# Patient Record
Sex: Female | Born: 1975 | Race: Black or African American | Hispanic: No | Marital: Married | Smoking: Never smoker
Health system: Southern US, Community
[De-identification: ages and names within clinical notes are randomized; demographics above are authoritative.]

## PROBLEM LIST (undated history)

## (undated) DIAGNOSIS — E119 Type 2 diabetes mellitus without complications: Secondary | ICD-10-CM

## (undated) DIAGNOSIS — M654 Radial styloid tenosynovitis [de Quervain]: Secondary | ICD-10-CM

## (undated) DIAGNOSIS — I10 Essential (primary) hypertension: Secondary | ICD-10-CM

## (undated) HISTORY — PX: HERNIA REPAIR: SHX51

## (undated) HISTORY — PX: ABDOMINAL HYSTERECTOMY: SHX81

---

## 2014-05-01 ENCOUNTER — Emergency Department (HOSPITAL_BASED_OUTPATIENT_CLINIC_OR_DEPARTMENT_OTHER)
Admission: EM | Admit: 2014-05-01 | Discharge: 2014-05-01 | Disposition: A | Discharge status: Home / Self Care | Payer: BLUE CROSS/BLUE SHIELD | Attending: Emergency Medicine | Admitting: Emergency Medicine

## 2014-05-01 ENCOUNTER — Encounter (HOSPITAL_BASED_OUTPATIENT_CLINIC_OR_DEPARTMENT_OTHER): Payer: Self-pay | Admitting: *Deleted

## 2014-05-01 DIAGNOSIS — Y93G3 Activity, cooking and baking: Secondary | ICD-10-CM | POA: Insufficient documentation

## 2014-05-01 DIAGNOSIS — T22291A Burn of second degree of multiple sites of right shoulder and upper limb, except wrist and hand, initial encounter: Secondary | ICD-10-CM | POA: Diagnosis not present

## 2014-05-01 DIAGNOSIS — T22091A Burn of unspecified degree of multiple sites of right shoulder and upper limb, except wrist and hand, initial encounter: Secondary | ICD-10-CM | POA: Diagnosis present

## 2014-05-01 DIAGNOSIS — X158XXA Contact with other hot household appliances, initial encounter: Secondary | ICD-10-CM | POA: Diagnosis not present

## 2014-05-01 DIAGNOSIS — Y92008 Other place in unspecified non-institutional (private) residence as the place of occurrence of the external cause: Secondary | ICD-10-CM | POA: Diagnosis not present

## 2014-05-01 DIAGNOSIS — T300 Burn of unspecified body region, unspecified degree: Secondary | ICD-10-CM

## 2014-05-01 DIAGNOSIS — Y998 Other external cause status: Secondary | ICD-10-CM | POA: Insufficient documentation

## 2014-05-01 MED ORDER — SILVER SULFADIAZINE 1 % EX CREA: 1.0000 "application " | TOPICAL_CREAM | Freq: Every day | CUTANEOUS | Status: DC

## 2014-05-01 MED ORDER — IBUPROFEN 400 MG PO TABS
400.0000 mg | ORAL_TABLET | Freq: Once | ORAL | Status: AC
  Administered 2014-05-01: 400 mg via ORAL
  Filled 2014-05-01: qty 1

## 2014-05-01 MED ORDER — SILVER SULFADIAZINE 1 % EX CREA
TOPICAL_CREAM | Freq: Once | CUTANEOUS | Status: AC
  Administered 2014-05-01: 22:00:00 via TOPICAL
  Filled 2014-05-01: qty 85

## 2014-05-01 NOTE — Discharge Instructions (Signed)
For pain control please take Ibuprofen (also known as Motrin or Advil) 400mg  (this is normally 2 over the counter pills) every 6 hours. Take with food to minimize stomach irritation.  If you see signs of infection (warmth, redness, tenderness, pus, sharp increase in pain, fever, red streaking) immediately return to the emergency department.  Burn Care Your skin is a natural barrier to infection. It is the largest organ of your body. Burns damage this natural protection. To help prevent infection, it is very important to follow your caregiver's instructions in the care of your burn. Burns are classified as:  First degree. There is only redness of the skin (erythema). No scarring is expected.  Second degree. There is blistering of the skin. Scarring may occur with deeper burns.  Third degree. All layers of the skin are injured, and scarring is expected. HOME CARE INSTRUCTIONS   Wash your hands well before changing your bandage.  Change your bandage as often as directed by your caregiver.  Remove the old bandage. If the bandage sticks, you may soak it off with cool, clean water.  Cleanse the burn thoroughly but gently with mild soap and water.  Pat the area dry with a clean, dry cloth.  Apply a thin layer of antibacterial cream to the burn.  Apply a clean bandage as instructed by your caregiver.  Keep the bandage as clean and dry as possible.  Elevate the affected area for the first 24 hours, then as instructed by your caregiver.  Only take over-the-counter or prescription medicines for pain, discomfort, or fever as directed by your caregiver. SEEK IMMEDIATE MEDICAL CARE IF:   You develop excessive pain.  You develop redness, tenderness, swelling, or red streaks near the burn.  The burned area develops yellowish-white fluid (pus) or a bad smell.  You have a fever. MAKE SURE YOU:   Understand these instructions.  Will watch your condition.  Will get help right away if you  are not doing well or get worse. Document Released: 02/08/2005 Document Revised: 05/03/2011 Document Reviewed: 07/01/2010 [facility] Patient Information 2015 [place]. This information is not intended to replace advice given to you by your health care provider. Make sure you discuss any questions you have with your health care provider.

## 2014-05-01 NOTE — ED Provider Notes (Signed)
CSN: [id number]     Arrival date & time 05/01/14  2048 History  This chart was scribed for Wynetta Emery, PA-C, working with Elwin Mocha, MD by Chestine Spore, ED Scribe. The patient was seen in room MHT13/MHT13 at 9:21 PM.    Chief Complaint  Patient presents with  . Burn      The history is provided by the patient. No language interpreter was used.    HPI Comments: Katherine Cabrera is a 39 y.o. female who presents to the Emergency Department complaining of burn onset 4 days ago. Pt burned the back of her arm on the oven. She was getting bread out of the oven and the oven door began to swing back onto her arm without her noticing. Pt had a friend to put burn cream on her arm as soon as it occurred. Pt came in today because it is sore and it is beginning to feel numb. She states that she is having associated symptoms of numbness. She states that she has not tried any medication for the relief of her symptoms. She denies any other symptoms.   History reviewed. No pertinent past medical history. Past Surgical History  Procedure Laterality Date  . Cesarean section     History reviewed. No pertinent family history. History  Substance Use Topics  . Smoking status: Never Smoker   . Smokeless tobacco: Not on file  . Alcohol Use: No   OB History    No data available     Review of Systems  Constitutional: Negative for fever and chills.  Skin: Positive for wound (burn to the back of arm).  Neurological: Positive for numbness.  10 systems reviewed and found to be negative, except as noted in the HPI.     Allergies  Review of patient's allergies indicates no known allergies.  Home Medications   Prior to Admission medications   Not on File   BP 121/73 mmHg  Pulse 88  Temp(Src) 98.2 F (36.8 C) (Oral)  Resp 18  SpO2 100%  LMP 04/02/2014  Physical Exam  Constitutional: She is oriented to person, place, and time. She appears well-developed and well-nourished. No distress.   HENT:  Head: Normocephalic and atraumatic.  Eyes: EOM are normal.  Neck: Neck supple. No tracheal deviation present.  Cardiovascular: Normal rate, regular rhythm and intact distal pulses.   Pulmonary/Chest: Effort normal. No respiratory distress.  Musculoskeletal: Normal range of motion.  Neurological: She is alert and oriented to person, place, and time.  Skin: Skin is warm and dry. Burn noted.  Partial thickness burn non-circumferential, 5 x 15 cm to the right lateral arm. No surrounding induration, discharge or significant warmth.  Distally neurovascularly intact.   Psychiatric: She has a normal mood and affect. Her behavior is normal.  Nursing note and vitals reviewed.   ED Course  Procedures (including critical care time) DIAGNOSTIC STUDIES: Oxygen Saturation is 100% on RA, normal by my interpretation.    COORDINATION OF CARE: 9:22 PM-Discussed treatment plan which includes Silvadene Cream Rx, Motrin, f/u if the symptoms worsen with pt at bedside and pt agreed to plan.   Labs Review Labs Reviewed - No data to display  Imaging Review No results found.   EKG Interpretation None      MDM   Final diagnoses:  Burn    Filed Vitals:   05/01/14 2056  BP: 121/73  Pulse: 88  Temp: 98.2 F (36.8 C)  TempSrc: Oral  Resp: 18  SpO2: 100%  Medications  silver sulfADIAZINE (SILVADENE) 1 % cream (not administered)  ibuprofen (ADVIL,MOTRIN) tablet 400 mg (not administered)    Katherine Cabrera is a pleasant 39 y.o. female presenting with well-healing burn to lateral right arm. This is non-circumferential, there is no signs of secondary infection. Patient will be given Silvadene cream and recommend ibuprofen for pain control. We have had an extensive discussion of return precautions for infection. Patient verbalizes her understanding.  Evaluation does not show pathology that would require ongoing emergent intervention or inpatient treatment. Pt is hemodynamically stable  and mentating appropriately. Discussed findings and plan with patient/guardian, who agrees with care plan. All questions answered. Return precautions discussed and outpatient follow up given.   New Prescriptions   SILVER SULFADIAZINE (SILVADENE) 1 % CREAM    Apply 1 application topically daily.     I personally performed the services described in this documentation, which was scribed in my presence. The recorded information has been reviewed and is accurate.   Wynetta Emeryicole Fartun Paradiso, PA-C 05/01/14 2206  Elwin MochaBlair Walden, MD 05/01/14 [phone]

## 2014-05-01 NOTE — ED Notes (Signed)
Pt reports burning the back of her arm on Sunday on the oven.  Noted to have a healing burn on her (R) arm.  No weeping, swelling noted.

## 2014-08-10 ENCOUNTER — Emergency Department (HOSPITAL_COMMUNITY): Payer: BLUE CROSS/BLUE SHIELD

## 2014-08-10 ENCOUNTER — Encounter (HOSPITAL_BASED_OUTPATIENT_CLINIC_OR_DEPARTMENT_OTHER): Payer: Self-pay | Admitting: Emergency Medicine

## 2014-08-10 ENCOUNTER — Emergency Department (HOSPITAL_BASED_OUTPATIENT_CLINIC_OR_DEPARTMENT_OTHER): Payer: BLUE CROSS/BLUE SHIELD

## 2014-08-10 ENCOUNTER — Emergency Department (HOSPITAL_BASED_OUTPATIENT_CLINIC_OR_DEPARTMENT_OTHER)
Admission: EM | Admit: 2014-08-10 | Discharge: 2014-08-10 | Disposition: A | Discharge status: Home / Self Care | Payer: BLUE CROSS/BLUE SHIELD | Attending: Emergency Medicine | Admitting: Emergency Medicine

## 2014-08-10 DIAGNOSIS — R200 Anesthesia of skin: Secondary | ICD-10-CM | POA: Diagnosis not present

## 2014-08-10 DIAGNOSIS — Z8639 Personal history of other endocrine, nutritional and metabolic disease: Secondary | ICD-10-CM

## 2014-08-10 LAB — CBC WITH DIFFERENTIAL/PLATELET
BASOS ABS: 0 10*3/uL (ref 0.0–0.1)
Basophils Relative: 1 % (ref 0–1)
EOS ABS: 0.3 10*3/uL (ref 0.0–0.7)
EOS PCT: 7 % — AB (ref 0–5)
HEMATOCRIT: 39.4 % (ref 36.0–46.0)
Hemoglobin: 13 g/dL (ref 12.0–15.0)
LYMPHS PCT: 49 % — AB (ref 12–46)
Lymphs Abs: 2.1 10*3/uL (ref 0.7–4.0)
MCH: 27.3 pg (ref 26.0–34.0)
MCHC: 33 g/dL (ref 30.0–36.0)
MCV: 82.8 fL (ref 78.0–100.0)
MONO ABS: 0.4 10*3/uL (ref 0.1–1.0)
Monocytes Relative: 9 % (ref 3–12)
Neutro Abs: 1.5 10*3/uL — ABNORMAL LOW (ref 1.7–7.7)
Neutrophils Relative %: 34 % — ABNORMAL LOW (ref 43–77)
Platelets: 200 10*3/uL (ref 150–400)
RBC: 4.76 MIL/uL (ref 3.87–5.11)
RDW: 13.4 % (ref 11.5–15.5)
WBC: 4.3 10*3/uL (ref 4.0–10.5)

## 2014-08-10 LAB — COMPREHENSIVE METABOLIC PANEL
ALT: 13 U/L — ABNORMAL LOW (ref 14–54)
ANION GAP: 9 (ref 5–15)
AST: 17 U/L (ref 15–41)
Albumin: 4 g/dL (ref 3.5–5.0)
Alkaline Phosphatase: 77 U/L (ref 38–126)
BUN: 23 mg/dL — AB (ref 6–20)
CALCIUM: 8.9 mg/dL (ref 8.9–10.3)
CO2: 24 mmol/L (ref 22–32)
CREATININE: 0.87 mg/dL (ref 0.44–1.00)
Chloride: 105 mmol/L (ref 101–111)
GFR calc Af Amer: >60 mL/min (ref >60)
GFR calc non Af Amer: >60 mL/min (ref >60)
Glucose, Bld: 148 mg/dL — ABNORMAL HIGH (ref 65–99)
Potassium: 4 mmol/L (ref 3.5–5.1)
Sodium: 138 mmol/L (ref 135–145)
Total Bilirubin: 0.3 mg/dL (ref 0.3–1.2)
Total Protein: 7.3 g/dL (ref 6.5–8.1)

## 2014-08-10 LAB — CBG MONITORING, ED: GLUCOSE-CAPILLARY: 184 mg/dL — AB (ref 65–99)

## 2014-08-10 LAB — PROTIME-INR
INR: 1 (ref 0.00–1.49)
Prothrombin Time: 13.4 seconds (ref 11.6–15.2)

## 2014-08-10 MED ORDER — ONDANSETRON HCL 4 MG/2ML IJ SOLN
INTRAMUSCULAR | Status: AC
  Filled 2014-08-10: qty 2

## 2014-08-10 MED ORDER — GADOBENATE DIMEGLUMINE 529 MG/ML IV SOLN
20.0000 mL | Freq: Once | INTRAVENOUS | Status: AC | PRN
  Administered 2014-08-10: 20 mL via INTRAVENOUS

## 2014-08-10 MED ORDER — ONDANSETRON HCL 4 MG/2ML IJ SOLN
4.0000 mg | Freq: Once | INTRAMUSCULAR | Status: AC
  Administered 2014-08-10: 4 mg via INTRAVENOUS

## 2014-08-10 NOTE — ED Provider Notes (Signed)
CSN: [id number]     Arrival date & time 08/10/14  0008 History   First MD Initiated Contact with Patient 08/10/14 0025     Chief Complaint  Patient presents with  . Numbness     (Consider location/radiation/quality/duration/timing/severity/associated sxs/prior Treatment) HPI Comments: Patient is a 39 year old female with history of type 2 diabetes, off all meds for the past year after weight loss. She presents today with complaints of numbness in the right side of her face and right arm that started while taking a shower. Her husband feels as though her speech is slurred. She denies to me she is experiencing any headache or visual changes. She denies any involvement of her leg. She denies any weakness.  Patient is a 39 y.o. female presenting with weakness. The history is provided by the patient.  Weakness This is a new problem. The current episode started less than 1 hour ago. The problem occurs constantly. The problem has not changed since onset.Pertinent negatives include no chest pain and no headaches. Nothing aggravates the symptoms. Nothing relieves the symptoms. She has tried nothing for the symptoms. The treatment provided no relief.    History reviewed. No pertinent past medical history. Past Surgical History  Procedure Laterality Date  . Cesarean section     History reviewed. No pertinent family history. History  Substance Use Topics  . Smoking status: Never Smoker   . Smokeless tobacco: Not on file  . Alcohol Use: No   OB History    No data available     Review of Systems  Cardiovascular: Negative for chest pain.  Neurological: Positive for weakness. Negative for headaches.  All other systems reviewed and are negative.     Allergies  Review of patient's allergies indicates no known allergies.  Home Medications   Prior to Admission medications   Medication Sig Start Date End Date Taking? Authorizing Provider  silver sulfADIAZINE (SILVADENE) 1 % cream Apply 1  application topically daily. 05/01/14   Nicole Pisciotta, PA-C   BP 131/66 mmHg  Pulse 79  Temp(Src) 98.6 F (37 C) (Oral)  Resp 18  Ht  (1.626 m)  Wt 210 lb (95.255 kg)  BMI 36.03 kg/m2  SpO2 100%  LMP 07/17/2014 (Approximate) Physical Exam  Constitutional: She is oriented to person, place, and time. She appears well-developed and well-nourished. No distress.  HENT:  Head: Normocephalic and atraumatic.  Eyes: EOM are normal. Pupils are equal, round, and reactive to light.  Neck: Normal range of motion. Neck supple.  Cardiovascular: Normal rate and regular rhythm.  Exam reveals no gallop and no friction rub.   No murmur heard. Pulmonary/Chest: Effort normal and breath sounds normal. No respiratory distress. She has no wheezes.  Abdominal: Soft. Bowel sounds are normal. She exhibits no distension. There is no tenderness.  Musculoskeletal: Normal range of motion.  Neurological: She is alert and oriented to person, place, and time. No cranial nerve deficit. She exhibits normal muscle tone. Coordination normal.  Skin: Skin is warm and dry. She is not diaphoretic.  Nursing note and vitals reviewed.   ED Course  Procedures (including critical care time) Labs Review Labs Reviewed  COMPREHENSIVE METABOLIC PANEL - Abnormal; Notable for the following:    Glucose, Bld 148 (*)    BUN 23 (*)    ALT 13 (*)    All other components within normal limits  CBC WITH DIFFERENTIAL/PLATELET - Abnormal; Notable for the following:    Neutrophils Relative % 34 (*)    Neutro  Abs 1.5 (*)    Lymphocytes Relative 49 (*)    Eosinophils Relative 7 (*)    All other components within normal limits  CBG MONITORING, ED - Abnormal; Notable for the following:    Glucose-Capillary 184 (*)    All other components within normal limits  PROTIME-INR    Imaging Review Ct Head Wo Contrast  08/10/2014   CLINICAL DATA:  Right-sided facial numbness moving down the right arm. Slurring speech.  EXAM: CT HEAD  WITHOUT CONTRAST  TECHNIQUE: Contiguous axial images were obtained from the base of the skull through the vertex without intravenous contrast.  COMPARISON:  None.  FINDINGS: Ventricles and sulci appear symmetrical. No mass effect or midline shift. No abnormal extra-axial fluid collections. Gray-white matter junctions are distinct. Basal cisterns are not effaced. No evidence of acute intracranial hemorrhage. No depressed skull fractures. Visualized paranasal sinuses and mastoid air cells are not opacified.  IMPRESSION: No acute intracranial abnormalities.   Electronically Signed   By: Burman Nieves M.D.   On: 08/10/2014 00:54     EKG Interpretation   Date/Time:  Saturday August 10 2014 00:24:15 EDT Ventricular Rate:  79 PR Interval:  130 QRS Duration: 86 QT Interval:  384 QTC Calculation: 440 R Axis:   101 Text Interpretation:  Normal sinus rhythm with sinus arrhythmia Rightward  axis Borderline ECG Confirmed by Vangie Henthorn  MD, Taiga Lupinacci ([id number]) on 08/10/2014  12:32:08 AM      MDM   Final diagnoses:  None    Patient is a 39 year old female who presents with numbness to her right arm and face that started while taking a shower this evening. Her exam reveals no focal deficits. Her strength is 5 out of 5 in all extremities, there is no facial droop or speech impediment, and her coordination is within normal limits. Her symptoms are mainly subjective in nature. She has undergone a CT scan which is unremarkable and laboratory studies which revealed no significant abnormalities. I've discussed this case with Dr. Hosie Poisson from neurology who is recommending the patient be transferred to [facility] for an MRI to rule out the possibility of stroke or MS. I've also spoke with Dr. Littie Deeds who agrees to accept in transfer.    Geoffery Lyons, MD 08/10/14 [phone]

## 2014-08-10 NOTE — ED Notes (Signed)
Pt comfortable with discharge and follow up instructions. Pt declines wheelchair, escorted to waiting area by this RN. No prescriptions. 

## 2014-08-10 NOTE — ED Provider Notes (Signed)
6:32 AM Patient signed out to me at change of shift by Tyler Deis, PA-C.  Pt with R sided facial and R arm numbness, slurred speech per husband.  Seen at [facility] by Dr Judd Lien.  Please see his note for further details.  Plan is for MRI brain to r/o stroke, MS.  Per PA Piepenbrink, pt still feels numbness in face but has otherwise normal neurologic exam at this time. Will discuss plan with neurology following MRI.    8:31 AM Pt reports she is feeling back to baseline now.  As she explains her symptoms to me, she had right lower face numbness and reported slurred speech around midnight that gradually resolved over a few hours.  She also had right arm and right leg aching and tingling in the bottom of her right foot.  Her MRI was negative.  Plan is to discuss with neurohospitalist for recommendations at this time.  CN II-XII intact, EOMs intact, no pronator drift, grip strengths equal bilaterally; strength 5/5 in all extremities, sensation intact in all extremities; finger to nose, heel to shin, rapid alternating movements normal; gait is normal.     8:42 AM Discussed pt, workup, and plan with Dr Amada Jupiter.  Pt may be d/c home with PCP follow up.  Doubt TIA given description of symptoms with progression down the body and pain involvement.   Results for orders placed or performed during the hospital encounter of 08/10/14  Comprehensive metabolic panel  Result Value Ref Range   Sodium 138 135 - 145 mmol/L   Potassium 4.0 3.5 - 5.1 mmol/L   Chloride 105 101 - 111 mmol/L   CO2 24 22 - 32 mmol/L   Glucose, Bld 148 (H) 65 - 99 mg/dL   BUN 23 (H) 6 - 20 mg/dL   Creatinine, Ser 2.44 0.44 - 1.00 mg/dL   Calcium 8.9 8.9 - 01.0 mg/dL   Total Protein 7.3 6.5 - 8.1 g/dL   Albumin 4.0 3.5 - 5.0 g/dL   AST 17 15 - 41 U/L   ALT 13 (L) 14 - 54 U/L   Alkaline Phosphatase 77 38 - 126 U/L   Total Bilirubin 0.3 0.3 - 1.2 mg/dL   GFR calc non Af Amer >60 >60 mL/min   GFR calc Af Amer >60 >60  mL/min   Anion gap 9 5 - 15  CBC with Differential  Result Value Ref Range   WBC 4.3 4.0 - 10.5 K/uL   RBC 4.76 3.87 - 5.11 MIL/uL   Hemoglobin 13.0 12.0 - 15.0 g/dL   HCT 27.2 53.6 - 64.4 %   MCV 82.8 78.0 - 100.0 fL   MCH 27.3 26.0 - 34.0 pg   MCHC 33.0 30.0 - 36.0 g/dL   RDW 03.4 74.2 - 59.5 %   Platelets 200 150 - 400 K/uL   Neutrophils Relative % 34 (L) 43 - 77 %   Neutro Abs 1.5 (L) 1.7 - 7.7 K/uL   Lymphocytes Relative 49 (H) 12 - 46 %   Lymphs Abs 2.1 0.7 - 4.0 K/uL   Monocytes Relative 9 3 - 12 %   Monocytes Absolute 0.4 0.1 - 1.0 K/uL   Eosinophils Relative 7 (H) 0 - 5 %   Eosinophils Absolute 0.3 0.0 - 0.7 K/uL   Basophils Relative 1 0 - 1 %   Basophils Absolute 0.0 0.0 - 0.1 K/uL  Protime-INR  Result Value Ref Range   Prothrombin Time 13.4 11.6 - 15.2 seconds  INR 1.00 0.00 - 1.49  CBG monitoring, ED  Result Value Ref Range   Glucose-Capillary 184 (H) 65 - 99 mg/dL   Ct Head Wo Contrast  08/10/2014   CLINICAL DATA:  Right-sided facial numbness moving down the right arm. Slurring speech.  EXAM: CT HEAD WITHOUT CONTRAST  TECHNIQUE: Contiguous axial images were obtained from the base of the skull through the vertex without intravenous contrast.  COMPARISON:  None.  FINDINGS: Ventricles and sulci appear symmetrical. No mass effect or midline shift. No abnormal extra-axial fluid collections. Gray-white matter junctions are distinct. Basal cisterns are not effaced. No evidence of acute intracranial hemorrhage. No depressed skull fractures. Visualized paranasal sinuses and mastoid air cells are not opacified.  IMPRESSION: No acute intracranial abnormalities.   Electronically Signed   By: Burman Nieves M.D.   On: 08/10/2014 00:54   Mr Katherine Cabrera Contrast  08/10/2014   CLINICAL DATA:  39 year old female, history of type 2 diabetes, presents with acute right-sided facial and arm numbness.  EXAM: MRI HEAD WITHOUT AND WITH CONTRAST  TECHNIQUE: Multiplanar, multiecho pulse  sequences of the brain and surrounding structures were obtained without and with intravenous contrast.  CONTRAST:  20mL MULTIHANCE GADOBENATE DIMEGLUMINE 529 MG/ML IV SOLN  COMPARISON:  Prior CT from earlier the same day.  FINDINGS: Examination mildly limited by susceptibility artifact overlying the face.  Cerebral volume within normal limits for patient age. No significant white matter changes present. No mass lesion, midline shift, or mass effect. No hydrocephalus. No extra-axial fluid collection.  No abnormal foci of restricted diffusion to suggest acute intracranial infarct. Gray-white matter differentiation is well maintained. Normal intravascular flow voids are preserved.  Craniocervical junction within normal limits. Pituitary gland normal. No acute abnormality about the orbits.  Mild mucosal thickening present within the maxillary sinuses. No air-fluid levels to suggest active sinus infection. No mastoid effusion. Inner ear structures normal.  No abnormal enhancement on post-contrast sequences.  Bone marrow signal intensity within normal limits. Scalp soft tissues are unremarkable.  IMPRESSION: Normal MRI of the brain with no acute intracranial process identified.   Electronically Signed   By: Rise Mu M.D.   On: 08/10/2014 07:29      Trixie Dredge, PA-C 08/10/14 1111  Marisa Severin, MD 08/12/14 1357

## 2014-08-10 NOTE — ED Notes (Signed)
Patient states that she was taking a shower about 1145 and started to have right sided facial numbness.Patient reports that it is progressively getting work and moving down her right arm. Husband reports that she is slurring her speech.

## 2014-08-10 NOTE — ED Notes (Signed)
I took CBG per nurse request, I got result of 184 mg./dcltr. I then placed patient on room monitor.

## 2014-08-10 NOTE — ED Provider Notes (Signed)
Patient transferred from [facility] for MR for evaluation of R sided facial and arm numbness.  Patient states numbness is improving since transfer. No new complaints.  NAD NCAT PERRLa, EOMi Uvula midline w/o deviation MAEx4 without ataxia. CN 2-12 intact Sensation intact bilaterally.  Sign out to [facility], PA-C pending MRI.   Francee Piccolo, PA-C 08/10/14 [id number]  Marisa Severin, MD 08/12/14 1356

## 2014-08-10 NOTE — Discharge Instructions (Signed)
Read the information below.  You may return to the Emergency Department at any time for worsening condition or any new symptoms that concern you.  Please follow up with your primary care provider next week for a recheck.   SEEK MEDICAL ATTENTION IF: You develop possible problems with medications prescribed.  The medications don't resolve your headache, if it recurs , or if you have multiple episodes of vomiting or can't take fluids. You have a change from the usual headache. RETURN IMMEDIATELY IF you develop a sudden, severe headache or confusion, become poorly responsive or faint, develop a fever above 100.19F or problem breathing, have a change in speech, vision, swallowing, or understanding, or develop new weakness, numbness, tingling, incoordination, or have a seizure.    Paresthesia Paresthesia is a burning or prickling feeling. This feeling can happen in any part of the body. It often happens in the hands, arms, legs, or feet. HOME CARE  Avoid drinking alcohol.  Try massage or needle therapy (acupuncture) to help with your problems.  Keep all doctor visits as told. GET HELP RIGHT AWAY IF:   You feel weak.  You have trouble walking or moving.  You have problems speaking or seeing.  You feel confused.  You cannot control when you poop (bowel movement) or pee (urinate).  You lose feeling (numbness) after an injury.  You pass out (faint).  Your burning or prickling feeling gets worse when you walk.  You have pain, cramps, or feel dizzy.  You have a rash. MAKE SURE YOU:   Understand these instructions.  Will watch your condition.  Will get help right away if you are not doing well or get worse. Document Released: 01/22/2008 Document Revised: 05/03/2011 Document Reviewed: 10/30/2010 [facility] Patient Information 2015 [place], [place]. This information is not intended to replace advice given to you by your health care provider. Make sure you discuss any questions you have  with your health care provider.

## 2014-08-10 NOTE — ED Notes (Signed)
Called into the room because she is feeling nauseated. MD aware and orders carried out

## 2014-08-10 NOTE — ED Notes (Signed)
Reports pain in rt shoulder with numbness to rt arm

## 2014-09-26 ENCOUNTER — Emergency Department (HOSPITAL_BASED_OUTPATIENT_CLINIC_OR_DEPARTMENT_OTHER): Payer: BLUE CROSS/BLUE SHIELD

## 2014-09-26 ENCOUNTER — Encounter (HOSPITAL_BASED_OUTPATIENT_CLINIC_OR_DEPARTMENT_OTHER): Payer: Self-pay | Admitting: *Deleted

## 2014-09-26 ENCOUNTER — Emergency Department (HOSPITAL_BASED_OUTPATIENT_CLINIC_OR_DEPARTMENT_OTHER)
Admission: EM | Admit: 2014-09-26 | Discharge: 2014-09-26 | Disposition: A | Discharge status: Home / Self Care | Payer: BLUE CROSS/BLUE SHIELD | Attending: Emergency Medicine | Admitting: Emergency Medicine

## 2014-09-26 DIAGNOSIS — R1011 Right upper quadrant pain: Secondary | ICD-10-CM

## 2014-09-26 DIAGNOSIS — Z3202 Encounter for pregnancy test, result negative: Secondary | ICD-10-CM | POA: Insufficient documentation

## 2014-09-26 DIAGNOSIS — K2970 Gastritis, unspecified, without bleeding: Secondary | ICD-10-CM | POA: Diagnosis not present

## 2014-09-26 LAB — COMPREHENSIVE METABOLIC PANEL
ALBUMIN: 3.6 g/dL (ref 3.5–5.0)
ALK PHOS: 60 U/L (ref 38–126)
ALT: 12 U/L — ABNORMAL LOW (ref 14–54)
AST: 19 U/L (ref 15–41)
Anion gap: 8 (ref 5–15)
BUN: 21 mg/dL — ABNORMAL HIGH (ref 6–20)
CHLORIDE: 105 mmol/L (ref 101–111)
CO2: 24 mmol/L (ref 22–32)
CREATININE: 0.77 mg/dL (ref 0.44–1.00)
Calcium: 8.5 mg/dL — ABNORMAL LOW (ref 8.9–10.3)
GFR calc Af Amer: >60 mL/min (ref >60)
GFR calc non Af Amer: >60 mL/min (ref >60)
GLUCOSE: 165 mg/dL — AB (ref 65–99)
POTASSIUM: 3.6 mmol/L (ref 3.5–5.1)
Sodium: 137 mmol/L (ref 135–145)
Total Bilirubin: 0.5 mg/dL (ref 0.3–1.2)
Total Protein: 7.1 g/dL (ref 6.5–8.1)

## 2014-09-26 LAB — CBC WITH DIFFERENTIAL/PLATELET
BASOS ABS: 0 10*3/uL (ref 0.0–0.1)
BASOS PCT: 0 % (ref 0–1)
EOS ABS: 0.1 10*3/uL (ref 0.0–0.7)
Eosinophils Relative: 1 % (ref 0–5)
HEMATOCRIT: 34.6 % — AB (ref 36.0–46.0)
Hemoglobin: 11.5 g/dL — ABNORMAL LOW (ref 12.0–15.0)
Lymphocytes Relative: 8 % — ABNORMAL LOW (ref 12–46)
Lymphs Abs: 0.7 10*3/uL (ref 0.7–4.0)
MCH: 27.3 pg (ref 26.0–34.0)
MCHC: 33.2 g/dL (ref 30.0–36.0)
MCV: 82 fL (ref 78.0–100.0)
MONO ABS: 0.6 10*3/uL (ref 0.1–1.0)
Monocytes Relative: 7 % (ref 3–12)
Neutro Abs: 7.1 10*3/uL (ref 1.7–7.7)
Neutrophils Relative %: 84 % — ABNORMAL HIGH (ref 43–77)
PLATELETS: 165 10*3/uL (ref 150–400)
RBC: 4.22 MIL/uL (ref 3.87–5.11)
RDW: 12.7 % (ref 11.5–15.5)
WBC: 8.5 10*3/uL (ref 4.0–10.5)

## 2014-09-26 LAB — URINE MICROSCOPIC-ADD ON

## 2014-09-26 LAB — URINALYSIS, ROUTINE W REFLEX MICROSCOPIC
BILIRUBIN URINE: NEGATIVE
GLUCOSE, UA: NEGATIVE mg/dL
Ketones, ur: NEGATIVE mg/dL
Leukocytes, UA: NEGATIVE
NITRITE: NEGATIVE
PROTEIN: NEGATIVE mg/dL
SPECIFIC GRAVITY, URINE: 1.027 (ref 1.005–1.030)
Urobilinogen, UA: 0.2 mg/dL (ref 0.0–1.0)
pH: 5.5 (ref 5.0–8.0)

## 2014-09-26 LAB — LIPASE, BLOOD: Lipase: 17 U/L — ABNORMAL LOW (ref 22–51)

## 2014-09-26 LAB — PREGNANCY, URINE: Preg Test, Ur: NEGATIVE

## 2014-09-26 MED ORDER — GI COCKTAIL ~~LOC~~
30.0000 mL | Freq: Once | ORAL | Status: AC
  Administered 2014-09-26: 30 mL via ORAL
  Filled 2014-09-26: qty 30

## 2014-09-26 MED ORDER — LANSOPRAZOLE 15 MG PO TBDP: 15.0000 mg | ORAL_TABLET | Freq: Two times a day (BID) | ORAL | Status: DC

## 2014-09-26 NOTE — ED Provider Notes (Signed)
CSN: [id number]     Arrival date & time 09/26/14  0811 History   First MD Initiated Contact with Patient 09/26/14 0827     No chief complaint on file.    (Consider location/radiation/quality/duration/timing/severity/associated sxs/prior Treatment) HPI Comments: Patient presents with abdominal pain. She states earlier this morning she woke up with a crampy pain in her upper abdomen which has gotten worse throughout the morning. She now describes a sharp pain across her upper abdomen or in the right upper abdomen. She has some nausea but no vomiting. She's having normal bowel movements. She denies any urinary symptoms. She denies any vaginal bleeding or discharge. She denies any past abdominal surgeries other than a C-section. She states she ate chicken, green beans and biscuit for dinner last night.   History reviewed. No pertinent past medical history. Past Surgical History  Procedure Laterality Date  . Cesarean section     No family history on file. History  Substance Use Topics  . Smoking status: Never Smoker   . Smokeless tobacco: Not on file  . Alcohol Use: No   OB History    No data available     Review of Systems  Constitutional: Negative for fever, chills, diaphoresis and fatigue.  HENT: Negative for congestion, rhinorrhea and sneezing.   Eyes: Negative.   Respiratory: Negative for cough, chest tightness and shortness of breath.   Cardiovascular: Negative for chest pain and leg swelling.  Gastrointestinal: Positive for nausea and abdominal pain. Negative for vomiting, diarrhea and blood in stool.  Genitourinary: Negative for frequency, hematuria, flank pain and difficulty urinating.  Musculoskeletal: Negative for back pain and arthralgias.  Skin: Negative for rash.  Neurological: Negative for dizziness, speech difficulty, weakness, numbness and headaches.      Allergies  Review of patient's allergies indicates no known allergies.  Home Medications   Prior to  Admission medications   Medication Sig Start Date End Date Taking? Authorizing Provider  lansoprazole (PREVACID SOLUTAB) 15 MG disintegrating tablet Take 1 tablet (15 mg total) by mouth 2 (two) times daily before a meal. 09/26/14   Rolan Bucco, MD   BP 124/69 mmHg  Pulse 80  Temp(Src) 98.4 F (36.9 C) (Oral)  Resp 18  Ht  (1.6 m)  Wt 206 lb (93.441 kg)  BMI 36.50 kg/m2  SpO2 100%  LMP 09/17/2014 Physical Exam  Constitutional: She is oriented to person, place, and time. She appears well-developed and well-nourished.  HENT:  Head: Normocephalic and atraumatic.  Eyes: Pupils are equal, round, and reactive to light.  Neck: Normal range of motion. Neck supple.  Cardiovascular: Normal rate, regular rhythm and normal heart sounds.   Pulmonary/Chest: Effort normal and breath sounds normal. No respiratory distress. She has no wheezes. She has no rales. She exhibits no tenderness.  Abdominal: Soft. Bowel sounds are normal. There is tenderness (moderate TTP to epigastrium and RUQ). There is no rebound and no guarding.  Musculoskeletal: Normal range of motion. She exhibits no edema.  Lymphadenopathy:    She has no cervical adenopathy.  Neurological: She is alert and oriented to person, place, and time.  Skin: Skin is warm and dry. No rash noted.  Psychiatric: She has a normal mood and affect.    ED Course  Procedures (including critical care time) Labs Review Labs Reviewed  URINALYSIS, ROUTINE W REFLEX MICROSCOPIC (NOT AT [facility]) - Abnormal; Notable for the following:    APPearance CLOUDY (*)    Hgb urine dipstick MODERATE (*)    All  other components within normal limits  COMPREHENSIVE METABOLIC PANEL - Abnormal; Notable for the following:    Glucose, Bld 165 (*)    BUN 21 (*)    Calcium 8.5 (*)    ALT 12 (*)    All other components within normal limits  LIPASE, BLOOD - Abnormal; Notable for the following:    Lipase 17 (*)    All other components within normal limits  CBC WITH  DIFFERENTIAL/PLATELET - Abnormal; Notable for the following:    Hemoglobin 11.5 (*)    HCT 34.6 (*)    Neutrophils Relative % 84 (*)    Lymphocytes Relative 8 (*)    All other components within normal limits  URINE MICROSCOPIC-ADD ON - Abnormal; Notable for the following:    Squamous Epithelial / LPF FEW (*)    Bacteria, UA MANY (*)    All other components within normal limits  PREGNANCY, URINE    Imaging Review US Abdomen Limited Ruq  09/26/2014   CLINICAL DATA:  Right upper quadrant pain with nausea  EXAM: US ABDOMEN LIMITED - RIGHT UPPER QUADRANT  COMPARISON:  None.  FINDINGS: Gallbladder:  No gallstones or wall thickening visualized. There is no pericholecystic fluid. No sonographic Murphy sign noted.  Common bile duct:  Diameter: 2 mm. There is no intrahepatic or extrahepatic biliary duct dilatation.  Liver:  No focal lesion identified. Within normal limits in parenchymal echogenicity.  IMPRESSION: Study within normal limits.   Electronically Signed   By: Bretta Bang III M.D.   On: 09/26/2014 09:27     EKG Interpretation None      MDM   Final diagnoses:  RUQ pain  Gastritis    Patient presents with upper abdominal pain. Her ultrasound is negative for gallbladder disease. There is no evidence of pancreatitis. Her other labs are unremarkable. She's feeling better after GI cocktail. Her urine had a little bit of blood in it but she does say that she's having some vaginal spotting now. She doesn't have symptoms consistent with a UTI. Her glucose is mildly elevated. She does say that it's been elevated in the past. She does say that she has a primary care provider and I did encourage her to make a follow-up appointment with her primary care provider to discuss her elevated glucose levels and to have a recheck on her abdomen. I feel that her symptoms are more consistent with gastritis however I did tell her that if her pain continues she might need to have an endoscopy or further  gallbladder studies. I also advised her to use a bland diet. Return precautions were given.    Rolan Bucco, MD 09/26/14 1016

## 2014-09-26 NOTE — Discharge Instructions (Signed)

## 2014-09-26 NOTE — ED Notes (Signed)
C/o pain around umbilicus and radiated to right upper abd pain. No n/v/d. C/o chills. No vag discharge. Onset 0400 today.

## 2015-03-20 ENCOUNTER — Encounter (HOSPITAL_BASED_OUTPATIENT_CLINIC_OR_DEPARTMENT_OTHER): Payer: Self-pay | Admitting: *Deleted

## 2015-03-20 ENCOUNTER — Emergency Department (HOSPITAL_BASED_OUTPATIENT_CLINIC_OR_DEPARTMENT_OTHER)
Admission: EM | Admit: 2015-03-20 | Discharge: 2015-03-20 | Disposition: A | Discharge status: Home / Self Care | Payer: BLUE CROSS/BLUE SHIELD | Attending: Emergency Medicine | Admitting: Emergency Medicine

## 2015-03-20 DIAGNOSIS — Z8739 Personal history of other diseases of the musculoskeletal system and connective tissue: Secondary | ICD-10-CM | POA: Diagnosis not present

## 2015-03-20 DIAGNOSIS — Y9389 Activity, other specified: Secondary | ICD-10-CM | POA: Diagnosis not present

## 2015-03-20 DIAGNOSIS — Z79899 Other long term (current) drug therapy: Secondary | ICD-10-CM | POA: Insufficient documentation

## 2015-03-20 DIAGNOSIS — Y9289 Other specified places as the place of occurrence of the external cause: Secondary | ICD-10-CM | POA: Diagnosis not present

## 2015-03-20 DIAGNOSIS — Z3202 Encounter for pregnancy test, result negative: Secondary | ICD-10-CM | POA: Insufficient documentation

## 2015-03-20 DIAGNOSIS — X58XXXA Exposure to other specified factors, initial encounter: Secondary | ICD-10-CM | POA: Diagnosis not present

## 2015-03-20 DIAGNOSIS — S39011A Strain of muscle, fascia and tendon of abdomen, initial encounter: Secondary | ICD-10-CM | POA: Insufficient documentation

## 2015-03-20 DIAGNOSIS — Y998 Other external cause status: Secondary | ICD-10-CM | POA: Insufficient documentation

## 2015-03-20 DIAGNOSIS — S3992XA Unspecified injury of lower back, initial encounter: Secondary | ICD-10-CM | POA: Insufficient documentation

## 2015-03-20 DIAGNOSIS — S3991XA Unspecified injury of abdomen, initial encounter: Secondary | ICD-10-CM | POA: Diagnosis present

## 2015-03-20 DIAGNOSIS — T148XXA Other injury of unspecified body region, initial encounter: Secondary | ICD-10-CM

## 2015-03-20 HISTORY — DX: Radial styloid tenosynovitis (de quervain): M65.4

## 2015-03-20 LAB — URINE MICROSCOPIC-ADD ON

## 2015-03-20 LAB — URINALYSIS, ROUTINE W REFLEX MICROSCOPIC
BILIRUBIN URINE: NEGATIVE
Glucose, UA: NEGATIVE mg/dL
HGB URINE DIPSTICK: NEGATIVE
KETONES UR: NEGATIVE mg/dL
Nitrite: NEGATIVE
PROTEIN: NEGATIVE mg/dL
Specific Gravity, Urine: 1.024 (ref 1.005–1.030)
pH: 6.5 (ref 5.0–8.0)

## 2015-03-20 LAB — PREGNANCY, URINE: Preg Test, Ur: NEGATIVE

## 2015-03-20 MED ORDER — DIAZEPAM 5 MG PO TABS
5.0000 mg | ORAL_TABLET | Freq: Once | ORAL | Status: AC
  Administered 2015-03-20: 5 mg via ORAL
  Filled 2015-03-20: qty 1

## 2015-03-20 MED ORDER — CYCLOBENZAPRINE HCL 10 MG PO TABS: 10.0000 mg | ORAL_TABLET | Freq: Two times a day (BID) | ORAL | Status: DC | PRN

## 2015-03-20 NOTE — ED Provider Notes (Signed)
CSN: [id number]     Arrival date & time 03/20/15  2151 History  By signing my name below, I, Tanda Rockers, attest that this documentation has been prepared under the direction and in the presence of Alvira Monday, MD. Electronically Signed: Tanda Rockers, ED Scribe. 03/20/2015. 11:02 PM.     Chief Complaint  Patient presents with  . Abdominal Pain   Patient is a 40 y.o. female presenting with back pain. The history is provided by the patient. No language interpreter was used.  Back Pain Location:  Thoracic spine (Right sided) Quality:  Cramping (Sharp) Pain severity:  Moderate Onset quality:  Sudden Duration:  12 hours Timing:  Constant Progression:  Worsening Chronicity:  New Context comment:  Reaching in cabinet Worsened by:  Movement Ineffective treatments:  Heating pad Associated symptoms: no abdominal pain, no chest pain, no dysuria, no fever and no headaches      HPI Comments: Katherine Cabrera is a 40 y.o. female who presents to the Emergency Department complaining of sudden onset, constant, gradually worsening, moderate, sharp and cramping, right side pain radiating into right back that began this morning. Pt states that she was reaching to grab something out of a cabinet when she began having immediate pain. The pain is exacerbated with movement. There is no exacerbation or change in pain after eating or drinking. Pt applied heat without relief. No meds for the pain. Denies fever, nausea, vomiting, diarrhea, constipation, fever, vaginal bleeding, vaginal discharge, dysuria, hematuria, cough, shortness of breath, or any other associated symptoms. PSHx cesarean section. No hx kidney stones or gall stones.    Past Medical History  Diagnosis Date  . De Quervain's disease (radial styloid tenosynovitis)    Past Surgical History  Procedure Laterality Date  . Cesarean section     No family history on file. Social History  Substance Use Topics  . Smoking status: Never Smoker    . Smokeless tobacco: None  . Alcohol Use: No   OB History    No data available     Review of Systems  Constitutional: Negative for fever.  HENT: Negative for sore throat.   Eyes: Negative for visual disturbance.  Respiratory: Negative for cough and shortness of breath.   Cardiovascular: Negative for chest pain.  Gastrointestinal: Negative for nausea, vomiting, abdominal pain, diarrhea and constipation.  Genitourinary: Negative for dysuria, hematuria, vaginal bleeding, vaginal discharge and difficulty urinating.  Musculoskeletal: Positive for back pain and arthralgias (Right side pain). Negative for neck pain.  Skin: Negative for rash.  Neurological: Negative for syncope and headaches.   Allergies  Review of patient's allergies indicates no known allergies.  Home Medications   Prior to Admission medications   Medication Sig Start Date End Date Taking? Authorizing Provider  MELOXICAM PO Take by mouth.   Yes Historical Provider, MD  lansoprazole (PREVACID SOLUTAB) 15 MG disintegrating tablet Take 1 tablet (15 mg total) by mouth 2 (two) times daily before a meal. 09/26/14   Rolan Bucco, MD   BP 132/100 mmHg  Pulse 80  Temp(Src) 98 F (36.7 C) (Oral)  Resp 18  Ht 5' 3.5" (1.613 m)  Wt 227 lb (102.967 kg)  BMI 39.58 kg/m2  SpO2 100%  LMP 03/06/2015   Physical Exam  Constitutional: She is oriented to person, place, and time. She appears well-developed and well-nourished. No distress.  HENT:  Head: Normocephalic and atraumatic.  Eyes: Conjunctivae and EOM are normal.  Neck: Normal range of motion. Neck supple. No tracheal deviation present.  Cardiovascular: Normal rate, regular rhythm, normal heart sounds and intact distal pulses.  Exam reveals no gallop and no friction rub.   No murmur heard. Pulmonary/Chest: Effort normal and breath sounds normal. No respiratory distress. She has no wheezes. She has no rales.  Abdominal: Soft. She exhibits no distension. There is  tenderness (over ruq and ribs) in the right upper quadrant. There is CVA tenderness (r). There is no guarding, no tenderness at McBurney's point and negative Murphy's sign.  Musculoskeletal: Normal range of motion. She exhibits tenderness. She exhibits no edema.  Tenderness from right paraspinal muscles to RUQ  Neurological: She is alert and oriented to person, place, and time.  Skin: Skin is warm and dry. No rash noted. She is not diaphoretic. No erythema.  Psychiatric: She has a normal mood and affect. Her behavior is normal.  Nursing note and vitals reviewed.   ED Course  Procedures (including critical care time)  DIAGNOSTIC STUDIES: Oxygen Saturation is 100% on RA, normal by my interpretation.    COORDINATION OF CARE: 11:00 PM-Discussed treatment plan which includes muscle relaxant with pt at bedside and pt agreed to plan.   Labs Review Labs Reviewed  URINALYSIS, ROUTINE W REFLEX MICROSCOPIC (NOT AT [facility])  PREGNANCY, URINE    Imaging Review No results found.    EKG Interpretation None      MDM   Final diagnoses:  None   39yo female with no significant medical history presents with concern for right sided pain after reaching for something in cabinet this AM.  Patient with significant muscular tenderness extending from paraspinal muscles as well as lateral costal muscles towards RUQ.  Given mechanism of pain and tenderness extending in area overlying ribs, no n/v/fevers/pain with eating have low suspicion for cholecystitis, nephrolithiasis, appendicitis, TOA, ovarian torsion.  Urinalysis shows no infection and upreg negative.  Given valium and discharged with rx r flexeril and told to take mobic as previously rx for hand pain. Patient discharged in stable condition with understanding of reasons to return.   I personally performed the services described in this documentation, which was scribed in my presence. The recorded information has been reviewed and is accurate.       Alvira Monday, MD 03/21/15 1253

## 2015-03-20 NOTE — ED Notes (Signed)
Right upper quadrant pain with radiation into her back. Pain started this am after reaching up to get something out of a cabinet.

## 2015-09-21 ENCOUNTER — Encounter (HOSPITAL_BASED_OUTPATIENT_CLINIC_OR_DEPARTMENT_OTHER): Payer: Self-pay | Admitting: *Deleted

## 2015-09-21 ENCOUNTER — Emergency Department (HOSPITAL_BASED_OUTPATIENT_CLINIC_OR_DEPARTMENT_OTHER)
Admission: EM | Admit: 2015-09-21 | Discharge: 2015-09-22 | Disposition: A | Discharge status: Home / Self Care | Payer: BLUE CROSS/BLUE SHIELD | Attending: Emergency Medicine | Admitting: Emergency Medicine

## 2015-09-21 ENCOUNTER — Emergency Department (HOSPITAL_BASED_OUTPATIENT_CLINIC_OR_DEPARTMENT_OTHER): Payer: BLUE CROSS/BLUE SHIELD

## 2015-09-21 DIAGNOSIS — J9801 Acute bronchospasm: Secondary | ICD-10-CM | POA: Diagnosis not present

## 2015-09-21 DIAGNOSIS — R0602 Shortness of breath: Secondary | ICD-10-CM | POA: Diagnosis present

## 2015-09-21 MED ORDER — ALBUTEROL SULFATE (2.5 MG/3ML) 0.083% IN NEBU
5.0000 mg | INHALATION_SOLUTION | Freq: Once | RESPIRATORY_TRACT | Status: AC
  Administered 2015-09-21: 5 mg via RESPIRATORY_TRACT
  Filled 2015-09-21: qty 6

## 2015-09-21 MED ORDER — PREDNISONE 50 MG PO TABS
60.0000 mg | ORAL_TABLET | Freq: Once | ORAL | Status: AC
  Administered 2015-09-21: 60 mg via ORAL
  Filled 2015-09-21: qty 1

## 2015-09-21 NOTE — ED Triage Notes (Signed)
Pt reports that they were sanding their floors in their house.  Pt noted to be coughing.  Visibly SOB.  Speaking in broken sentences.  Stridor noted.

## 2015-09-21 NOTE — ED Provider Notes (Signed)
By signing my name below, I, Bridgette Habermann, attest that this documentation has been prepared under the direction and in the presence of Nina Hoar N Cleatus Gabriel, DO. Electronically Signed: Bridgette Habermann, ED Scribe. 09/21/15. 11:16 PM.   TIME SEEN:  First MD Initiated Contact with Patient 09/21/15 2314    CHIEF COMPLAINT:  Chief Complaint  Patient presents with  . Shortness of Breath    HPI:  HPI Comments: Katherine Cabrera is a 40 y.o. female with previous history of bronchitis who presents to the Emergency Department complaining of sudden onset, constant shortness of breath onset today. Pt reports people were sanding the floors in her house today. Pt also has associated cough and chest tightness. Pt is not a smoker. Pt has no h/o asthma but notes she had bronchitis when she was younger and did require albuterol. No history of PE, DVT, exogenous estrogen use, fracture, surgery, trauma, hospitalization, prolonged travel. No lower extremity swelling or pain. No calf tenderness. Pt denies fever or any other associated symptoms. She is not a smoker, states she has never been a smoker.  ROS: See HPI Constitutional: no fever  Eyes: no drainage  ENT: no runny nose   Cardiovascular: chest tightness  Resp: SOB  GI: no vomiting GU: no dysuria Integumentary: no rash  Allergy: no hives  Musculoskeletal: no leg swelling  Neurological: no slurred speech ROS otherwise negative  PAST MEDICAL HISTORY/PAST SURGICAL HISTORY:  Past Medical History:  Diagnosis Date  . De Quervain's disease (radial styloid tenosynovitis)     MEDICATIONS:  Prior to Admission medications   Not on File    ALLERGIES:  No Known Allergies  SOCIAL HISTORY:  Social History  Substance Use Topics  . Smoking status: Never Smoker  . Smokeless tobacco: Not on file  . Alcohol use No    FAMILY HISTORY: History reviewed. No pertinent family history.  EXAM: BP 131/88   Pulse 101   Temp 97.5 F (36.4 C) (Axillary)   Resp 22   Ht 5'  3" (1.6 m)   Wt 210 lb (95.3 kg)   LMP 09/02/2015   SpO2 96%   BMI 37.20 kg/m  CONSTITUTIONAL: Alert and oriented and responds appropriately to questions. Well-appearing; well-nourished HEAD: Normocephalic EYES: Conjunctivae clear, PERRL ENT: normal nose; no rhinorrhea; moist mucous membranes NECK: Supple, no meningismus, no LAD  CARD: RRR; S1 and S2 appreciated; no murmurs, no clicks, no rubs, no gallops RESP: Normal chest excursion without splinting or tachypnea; breath sounds equal bilaterally; mild, diffuse, expiratory wheezes, no rhonchi, no rales, no hypoxia or respiratory distress, speaking full sentences ABD/GI: Normal bowel sounds; non-distended; soft, non-tender, no rebound, no guarding, no peritoneal signs BACK:  The back appears normal and is non-tender to palpation, there is no CVA tenderness EXT: Normal ROM in all joints; non-tender to palpation; no edema; normal capillary refill; no cyanosis, no calf tenderness or swelling    SKIN: Normal color for age and race; warm; no rash NEURO: Moves all extremities equally, sensation to light touch intact diffusely, cranial nerves II through XII intact PSYCH: The patient's mood and manner are appropriate. Grooming and personal hygiene are appropriate.  MEDICAL DECISION MAKING: Patient here with bronchospasm after husband was sanding floors of 13 house. Has history of bronchitis in the past. She is wheezing currently and has oxygen saturation of 91% on room air. We'll give albuterol, prednisone and reassess. Chest x-ray pending. Doubt ACS, PE.  ED PROGRESS: Patient reports feeling much better after albuterol. Able to ambulate and  her oxygen saturation is 99%. She does become tachycardic with a bleeding but did just receive albuterol. Chest x-ray shows no infiltrate, edema or pneumothorax. She feels much better and states she is ready for discharge home. We'll discharge with albuterol inhaler and prednisone burst. Have given her outpatient  follow-up information.      At this time, I do not feel there is any life-threatening condition present. I have reviewed and discussed all results (EKG, imaging, lab, urine as appropriate), exam findings with patient. I have reviewed nursing notes and appropriate previous records.  I feel the patient is safe to be discharged home without further emergent workup. Discussed usual and customary return precautions. Patient and family (if present) verbalize understanding and are comfortable with this plan.  Patient will follow-up with their primary care provider. If they do not have a primary care provider, information for follow-up has been provided to them. All questions have been answered.    I personally performed the services described in this documentation, which was scribed in my presence. The recorded information has been reviewed and is accurate.      Layla Maw Thang Flett, DO 09/22/15 [phone]

## 2015-09-22 MED ORDER — ALBUTEROL SULFATE HFA 108 (90 BASE) MCG/ACT IN AERS
2.0000 | INHALATION_SPRAY | Freq: Once | RESPIRATORY_TRACT | Status: AC
  Administered 2015-09-22: 2 via RESPIRATORY_TRACT
  Filled 2015-09-22: qty 6.7

## 2015-09-22 MED ORDER — PREDNISONE 20 MG PO TABS: 60.0000 mg | ORAL_TABLET | Freq: Every day | ORAL | 0 refills | Status: DC

## 2015-09-22 NOTE — Discharge Instructions (Signed)
To find a primary care or specialty doctor please call [phone] or [phone] to access "[facility] Find a Doctor Service." ° °You may also go on the [facility] website at [email or web address] ° °There are also multiple [facility] and [facility] practices throughout the [place] that are frequently accepting new patients. You may find a clinic that is close to your home and contact them. ° °[facility] -  °[address] °[place]  [address] [phone] ° °[facility] in [place] (also locations in [place] and [place]) -  °[address] °[place] [address] [phone] ° °[facility] -  °[address] °[place] [address] [phone] ° ° °

## 2019-06-11 ENCOUNTER — Emergency Department (HOSPITAL_BASED_OUTPATIENT_CLINIC_OR_DEPARTMENT_OTHER)
Admission: EM | Admit: 2019-06-11 | Discharge: 2019-06-12 | Disposition: A | Discharge status: Home / Self Care | Payer: BC Managed Care – PPO | Attending: Emergency Medicine | Admitting: Emergency Medicine

## 2019-06-11 ENCOUNTER — Encounter (HOSPITAL_BASED_OUTPATIENT_CLINIC_OR_DEPARTMENT_OTHER): Payer: Self-pay | Admitting: *Deleted

## 2019-06-11 ENCOUNTER — Other Ambulatory Visit: Payer: Self-pay

## 2019-06-11 DIAGNOSIS — R519 Headache, unspecified: Secondary | ICD-10-CM | POA: Diagnosis not present

## 2019-06-11 DIAGNOSIS — R110 Nausea: Secondary | ICD-10-CM | POA: Diagnosis not present

## 2019-06-11 NOTE — ED Triage Notes (Signed)
HA x 3 days. A/O, no distress noted.

## 2019-06-11 NOTE — ED Notes (Signed)
ED Provider at bedside. 

## 2019-06-12 MED ORDER — PROCHLORPERAZINE EDISYLATE 10 MG/2ML IJ SOLN
10.0000 mg | Freq: Once | INTRAMUSCULAR | Status: AC
  Administered 2019-06-12: 10 mg via INTRAVENOUS
  Filled 2019-06-12: qty 2

## 2019-06-12 MED ORDER — DIPHENHYDRAMINE HCL 50 MG/ML IJ SOLN
25.0000 mg | Freq: Once | INTRAMUSCULAR | Status: AC
  Administered 2019-06-12: 25 mg via INTRAVENOUS
  Filled 2019-06-12: qty 1

## 2019-06-12 NOTE — ED Provider Notes (Signed)
[facility] EMERGENCY DEPARTMENT Provider Note   CSN: [id number] Arrival date & time: 06/11/19  2107     History Chief Complaint  Patient presents with  . Headache    Katherine Cabrera is a 44 y.o. female.  HPI     This is a 44 year old female with no reported past medical history who presents with headache.  Patient reports 3-day history of headache.  She has no significant headache history.  Reports that headache is just over her nasal bridge.  She rates her pain 8 out of 10.  She does not normally get headaches.  She reports nausea without vomiting.  No photophobia.  No visual deficits, weakness, numbness, strokelike symptoms.  She denies any fevers or neck stiffness.  She does report receiving her [employer] Covid vaccine approximately 10 days ago.  Patient took over-the-counter medication with intermittent relief.  Past Medical History:  Diagnosis Date  . De Quervain's disease (radial styloid tenosynovitis)     There are no problems to display for this patient.   Past Surgical History:  Procedure Laterality Date  . CESAREAN SECTION       OB History   No obstetric history on file.     History reviewed. No pertinent family history.  Social History   Tobacco Use  . Smoking status: Never Smoker  Substance Use Topics  . Alcohol use: No  . Drug use: No    Home Medications Prior to Admission medications   Medication Sig Start Date End Date Taking? Authorizing Provider  predniSONE (DELTASONE) 20 MG tablet Take 3 tablets (60 mg total) by mouth daily. 09/22/15   Ward, Layla Maw, DO    Allergies    Patient has no known allergies.  Review of Systems   Review of Systems  Constitutional: Negative for fever.  Eyes: Negative for photophobia and visual disturbance.  Respiratory: Negative for shortness of breath.   Cardiovascular: Negative for chest pain.  Gastrointestinal: Positive for nausea. Negative for abdominal pain, diarrhea and vomiting.    Genitourinary: Negative for dysuria.  Musculoskeletal: Negative for neck pain and neck stiffness.  Neurological: Positive for headaches.  All other systems reviewed and are negative.   Physical Exam Updated Vital Signs BP 120/86 (BP Location: Left Arm)   Pulse 96   Temp 98 F (36.7 C) (Oral)   Resp 18   Ht 1.6 m (5\' 3" )   Wt 93.4 kg   LMP 05/07/2019   SpO2 100%   BMI 36.49 kg/m   Physical Exam Vitals and nursing note reviewed.  Constitutional:      Appearance: She is well-developed. She is not ill-appearing.  HENT:     Head: Normocephalic and atraumatic.     Mouth/Throat:     Mouth: Mucous membranes are moist.  Eyes:     Extraocular Movements: Extraocular movements intact.     Pupils: Pupils are equal, round, and reactive to light.     Comments: Visual fields intact  Cardiovascular:     Rate and Rhythm: Normal rate and regular rhythm.     Heart sounds: Normal heart sounds.  Pulmonary:     Effort: Pulmonary effort is normal. No respiratory distress.     Breath sounds: No wheezing.  Abdominal:     General: Bowel sounds are normal.     Palpations: Abdomen is soft.  Musculoskeletal:     Cervical back: Normal range of motion and neck supple.  Skin:    General: Skin is warm and dry.  Neurological:     Mental Status: She is alert and oriented to person, place, and time.     Comments: Cranial nerves II through XII intact, 5 out of 5 strength in all 4 extremities, no dysmetria to finger-nose-finger  Psychiatric:        Mood and Affect: Mood normal.     ED Results / Procedures / Treatments   Labs (all labs ordered are listed, but only abnormal results are displayed) Labs Reviewed - No data to display  EKG None  Radiology No results found.  Procedures Procedures (including critical care time)  Medications Ordered in ED Medications  prochlorperazine (COMPAZINE) injection 10 mg (10 mg Intravenous Given 06/12/19 0024)  diphenhydrAMINE (BENADRYL) injection 25  mg (25 mg Intravenous Given 06/12/19 0023)    ED Course  I have reviewed the triage vital signs and the nursing notes.  Pertinent labs & imaging results that were available during my care of the patient were reviewed by me and considered in my medical decision making (see chart for details).    MDM Rules/Calculators/A&P                       Patient presents with headache.  No headache history.  She is overall nontoxic on exam and vital signs are reassuring.  She is afebrile.  Doubt meningitis or subarachnoid hemorrhage.  She is neurologically intact without significant red flags.  She did have the [employer] vaccine which has been noted to have a slightly increased risk for blood clots.  Patient is otherwise low risk.  Discussed treatment and foregoing imaging initially to see how she responds to treatment.  Patient is agreeable to this plan.  I fairly low suspicion for venous dural thrombosis or subarachnoid hemorrhage.  Patient was given a migraine cocktail.  On recheck, she is resting comfortably.  She has had complete resolution of symptoms.  At this time we will forego additional testing as she is improved and neurologically intact.  After history, exam, and medical workup I feel the patient has been appropriately medically screened and is safe for discharge home. Pertinent diagnoses were discussed with the patient. Patient was given return precautions.   Final Clinical Impression(s) / ED Diagnoses Final diagnoses:  Bad headache    Rx / DC Orders ED Discharge Orders    None       Becky Colan, Barbette Hair, MD 06/12/19 [phone]

## 2019-06-12 NOTE — Discharge Instructions (Addendum)
You were seen today for a headache.  You improved with a headache cocktail.  Make sure that you are staying well-hydrated.  You should return for any acute worsening of headache, nausea, vomiting, neurologic changes.

## 2020-03-06 ENCOUNTER — Emergency Department (HOSPITAL_BASED_OUTPATIENT_CLINIC_OR_DEPARTMENT_OTHER)
Admission: EM | Admit: 2020-03-06 | Discharge: 2020-03-07 | Disposition: A | Discharge status: Home / Self Care | Payer: BC Managed Care – PPO | Attending: Emergency Medicine | Admitting: Emergency Medicine

## 2020-03-06 ENCOUNTER — Emergency Department (HOSPITAL_BASED_OUTPATIENT_CLINIC_OR_DEPARTMENT_OTHER): Payer: BC Managed Care – PPO

## 2020-03-06 ENCOUNTER — Encounter (HOSPITAL_BASED_OUTPATIENT_CLINIC_OR_DEPARTMENT_OTHER): Payer: Self-pay

## 2020-03-06 ENCOUNTER — Other Ambulatory Visit: Payer: Self-pay

## 2020-03-06 DIAGNOSIS — K529 Noninfective gastroenteritis and colitis, unspecified: Secondary | ICD-10-CM | POA: Insufficient documentation

## 2020-03-06 DIAGNOSIS — N898 Other specified noninflammatory disorders of vagina: Secondary | ICD-10-CM | POA: Insufficient documentation

## 2020-03-06 DIAGNOSIS — R1030 Lower abdominal pain, unspecified: Secondary | ICD-10-CM | POA: Diagnosis present

## 2020-03-06 DIAGNOSIS — R059 Cough, unspecified: Secondary | ICD-10-CM | POA: Insufficient documentation

## 2020-03-06 LAB — CBC
HCT: 39.1 % (ref 36.0–46.0)
Hemoglobin: 12.7 g/dL (ref 12.0–15.0)
MCH: 27.1 pg (ref 26.0–34.0)
MCHC: 32.5 g/dL (ref 30.0–36.0)
MCV: 83.5 fL (ref 80.0–100.0)
Platelets: 260 10*3/uL (ref 150–400)
RBC: 4.68 MIL/uL (ref 3.87–5.11)
RDW: 14.1 % (ref 11.5–15.5)
WBC: 6.8 10*3/uL (ref 4.0–10.5)
nRBC: 0 % (ref 0.0–0.2)

## 2020-03-06 LAB — URINALYSIS, MICROSCOPIC (REFLEX): Bacteria, UA: NONE SEEN

## 2020-03-06 LAB — COMPREHENSIVE METABOLIC PANEL
ALT: 17 U/L (ref 0–44)
AST: 14 U/L — ABNORMAL LOW (ref 15–41)
Albumin: 3.5 g/dL (ref 3.5–5.0)
Alkaline Phosphatase: 54 U/L (ref 38–126)
Anion gap: 9 (ref 5–15)
BUN: 17 mg/dL (ref 6–20)
CO2: 25 mmol/L (ref 22–32)
Calcium: 8.7 mg/dL — ABNORMAL LOW (ref 8.9–10.3)
Chloride: 103 mmol/L (ref 98–111)
Creatinine, Ser: 0.75 mg/dL (ref 0.44–1.00)
GFR, Estimated: >60 mL/min (ref >60)
Glucose, Bld: 147 mg/dL — ABNORMAL HIGH (ref 70–99)
Potassium: 3.6 mmol/L (ref 3.5–5.1)
Sodium: 137 mmol/L (ref 135–145)
Total Bilirubin: 0.4 mg/dL (ref 0.3–1.2)
Total Protein: 6.6 g/dL (ref 6.5–8.1)

## 2020-03-06 LAB — URINALYSIS, ROUTINE W REFLEX MICROSCOPIC
Glucose, UA: NEGATIVE mg/dL
Ketones, ur: 15 mg/dL — AB
Leukocytes,Ua: NEGATIVE
Nitrite: NEGATIVE
Protein, ur: NEGATIVE mg/dL
Specific Gravity, Urine: 1.03 (ref 1.005–1.030)
pH: 5 (ref 5.0–8.0)

## 2020-03-06 LAB — WET PREP, GENITAL
Clue Cells Wet Prep HPF POC: NONE SEEN
Sperm: NONE SEEN
Trich, Wet Prep: NONE SEEN
Yeast Wet Prep HPF POC: NONE SEEN

## 2020-03-06 LAB — LIPASE, BLOOD: Lipase: 34 U/L (ref 11–51)

## 2020-03-06 LAB — PREGNANCY, URINE: Preg Test, Ur: NEGATIVE

## 2020-03-06 MED ORDER — IOHEXOL 300 MG/ML  SOLN
100.0000 mL | Freq: Once | INTRAMUSCULAR | Status: AC | PRN
  Administered 2020-03-06: 100 mL via INTRAVENOUS

## 2020-03-06 MED ORDER — SODIUM CHLORIDE 0.9 % IV BOLUS
1000.0000 mL | Freq: Once | INTRAVENOUS | Status: AC
  Administered 2020-03-06: 1000 mL via INTRAVENOUS

## 2020-03-06 MED ORDER — OXYCODONE-ACETAMINOPHEN 5-325 MG PO TABS
1.0000 | ORAL_TABLET | Freq: Once | ORAL | Status: AC
  Administered 2020-03-07: 1 via ORAL
  Filled 2020-03-06: qty 1

## 2020-03-06 MED ORDER — HYDROMORPHONE HCL 1 MG/ML IJ SOLN
1.0000 mg | Freq: Once | INTRAMUSCULAR | Status: AC
  Administered 2020-03-06: 1 mg via INTRAVENOUS
  Filled 2020-03-06: qty 1

## 2020-03-06 MED ORDER — KETOROLAC TROMETHAMINE 30 MG/ML IJ SOLN
30.0000 mg | Freq: Once | INTRAMUSCULAR | Status: AC
  Administered 2020-03-07: 30 mg via INTRAVENOUS
  Filled 2020-03-06: qty 1

## 2020-03-06 NOTE — ED Triage Notes (Signed)
Pt reports having had hystrectomy & hernia surgery on 12/25/19 and today is having abd pain today with light pink vaginal bleeding. Pt reports having dizziness.

## 2020-03-06 NOTE — ED Notes (Signed)
Able to tolerate POs well. Denies any Nausea, Vomiting or Diarrhea.

## 2020-03-06 NOTE — ED Provider Notes (Signed)
[facility] EMERGENCY DEPARTMENT Provider Note   CSN: [id number] Arrival date & time: 03/06/20  1503     History Chief Complaint  Patient presents with  . Abdominal Pain    Katherine Cabrera is a 45 y.o. female who is status post hernia repair and hysterectomy in November 2021 presenting to the ED with a chief complaint of abdominal pain.  Reports sharp lower abdominal pain that is worse with coughing and palpation.  She denies any nausea, vomiting or diarrhea.  No dysuria.  States that this pain feels similar to the pain she experienced postop during her recovery after her surgeries in November.  States that she tested positive for COVID last week and has had a cough associated with this which is worsening her pain.  She has not taken any medications to help with her symptoms.  Pain began today.  Did wipe her genital area today and noticed pink discharge.  Denies any concern for STDs.  Has not had the similar symptoms as far as a discharge in the past.  No chest pain, fever, shortness of breath.  HPI     Past Medical History:  Diagnosis Date  . De Quervain's disease (radial styloid tenosynovitis)     There are no problems to display for this patient.   Past Surgical History:  Procedure Laterality Date  . ABDOMINAL HYSTERECTOMY    . CESAREAN SECTION    . HERNIA REPAIR       OB History   No obstetric history on file.     History reviewed. No pertinent family history.  Social History   Tobacco Use  . Smoking status: Never Smoker  . Smokeless tobacco: Never Used  Substance Use Topics  . Alcohol use: No  . Drug use: No    Home Medications Prior to Admission medications   Medication Sig Start Date End Date Taking? Authorizing Provider  dicyclomine (BENTYL) 20 MG tablet Take 1 tablet (20 mg total) by mouth 2 (two) times daily. 03/07/20  Yes Danya Spearman, PA-C  naproxen (NAPROSYN) 500 MG tablet Take 1 tablet (500 mg total) by mouth 2 (two) times daily. 03/07/20   Yes Townsend Cudworth, PA-C  predniSONE (DELTASONE) 20 MG tablet Take 3 tablets (60 mg total) by mouth daily. 09/22/15   Ward, Layla MawKristen N, DO    Allergies    Patient has no known allergies.  Review of Systems   Review of Systems  Constitutional: Negative for appetite change, chills and fever.  HENT: Negative for ear pain, rhinorrhea, sneezing and sore throat.   Eyes: Negative for photophobia and visual disturbance.  Respiratory: Positive for cough. Negative for chest tightness, shortness of breath and wheezing.   Cardiovascular: Negative for chest pain and palpitations.  Gastrointestinal: Positive for abdominal pain. Negative for blood in stool, constipation, diarrhea, nausea and vomiting.  Genitourinary: Positive for vaginal discharge. Negative for dysuria, hematuria and urgency.  Musculoskeletal: Negative for myalgias.  Skin: Negative for rash.  Neurological: Negative for dizziness, weakness and light-headedness.    Physical Exam Updated Vital Signs BP 110/76 (BP Location: Right Arm)   Pulse 93   Temp 98.6 F (37 C) (Oral)   Resp 18   Ht 5\' 3"  (1.6 m)   Wt 96.2 kg   LMP 05/07/2019   SpO2 99%   BMI 37.55 kg/m   Physical Exam Vitals and nursing note reviewed.  Constitutional:      General: She is not in acute distress.    Appearance: She is well-developed  and well-nourished.  HENT:     Head: Normocephalic and atraumatic.     Nose: Nose normal.  Eyes:     General: No scleral icterus.       Left eye: No discharge.     Extraocular Movements: EOM normal.     Conjunctiva/sclera: Conjunctivae normal.  Cardiovascular:     Rate and Rhythm: Normal rate and regular rhythm.     Pulses: Intact distal pulses.     Heart sounds: Normal heart sounds. No murmur heard. No friction rub. No gallop.   Pulmonary:     Effort: Pulmonary effort is normal. No respiratory distress.     Breath sounds: Normal breath sounds.  Abdominal:     General: Bowel sounds are normal. There is no  distension.     Palpations: Abdomen is soft.     Tenderness: There is abdominal tenderness in the right lower quadrant, suprapubic area and left lower quadrant. There is no guarding.  Genitourinary:    Vagina: Vaginal discharge present.     Cervix: No cervical motion tenderness.     Comments: Pelvic exam: normal external genitalia without evidence of trauma. VULVA: normal appearing vulva with no masses, tenderness or lesion. VAGINA: normal appearing vagina with light pink discharge, no lesions. CERVIX: normal appearing cervix without lesions, cervical motion tenderness absent, cervical os closed with out purulent discharge; No vaginal discharge. Wet prep and DNA probe for chlamydia and GC obtained.   ADNEXA: normal adnexa in size, nontender and no masses UTERUS: uterus is normal size, shape, consistency and nontender.   Musculoskeletal:        General: No edema. Normal range of motion.     Cervical back: Normal range of motion and neck supple.  Skin:    General: Skin is warm and dry.     Findings: No rash.  Neurological:     Mental Status: She is alert.     Motor: No abnormal muscle tone.     Coordination: Coordination normal.  Psychiatric:        Mood and Affect: Mood and affect normal.     ED Results / Procedures / Treatments   Labs (all labs ordered are listed, but only abnormal results are displayed) Labs Reviewed  WET PREP, GENITAL - Abnormal; Notable for the following components:      Result Value   WBC, Wet Prep HPF POC MANY (*)    All other components within normal limits  COMPREHENSIVE METABOLIC PANEL - Abnormal; Notable for the following components:   Glucose, Bld 147 (*)    Calcium 8.7 (*)    AST 14 (*)    All other components within normal limits  URINALYSIS, ROUTINE W REFLEX MICROSCOPIC - Abnormal; Notable for the following components:   Hgb urine dipstick SMALL (*)    Bilirubin Urine SMALL (*)    Ketones, ur 15 (*)    All other components within normal limits   LIPASE, BLOOD  CBC  PREGNANCY, URINE  URINALYSIS, MICROSCOPIC (REFLEX)  GC/CHLAMYDIA PROBE AMP ([place]) NOT AT [facility]    EKG None  Radiology CT ABDOMEN PELVIS W CONTRAST  Result Date: 03/06/2020 CLINICAL DATA:  Right lower quadrant abdominal pain. EXAM: CT ABDOMEN AND PELVIS WITH CONTRAST TECHNIQUE: Multidetector CT imaging of the abdomen and pelvis was performed using the standard protocol following bolus administration of intravenous contrast. CONTRAST:  OMNIPAQUE IOHEXOL 300 MG/ML  SOLN COMPARISON:  None. FINDINGS: Lower chest: Choose bilateral pleural effusions and basilar atelectasis. Heart is normal  in size. Hepatobiliary: No focal liver abnormality is seen. No gallstones, gallbladder wall thickening, or biliary dilatation. Pancreas: No ductal dilatation or inflammation. Spleen: Normal in size without focal abnormality. Adrenals/Urinary Tract: Normal adrenal glands. No hydronephrosis or perinephric edema. Homogeneous renal enhancement with symmetric excretion on delayed phase imaging. Urinary bladder is partially distended without wall thickening. Stomach/Bowel: Bowel evaluation is limited in the absence of enteric contrast. Stomach is decompressed. Normal positioning of the duodenum and ligament of Treitz. There are scattered fluid-filled small bowel loops with occasional areas of wall thickening. No bowel dilatation to suggest obstruction. Appendix is tentatively visualized and normal, series 2, image 64. No evidence of appendicitis. Liquid stool in the cecum and ascending colon. Remainder of the colon is decompressed. There is interposition of the splenic flexure posterior to the spleen under the hemidiaphragm. Scattered diverticula noted at the splenic flexure of the colon. No diverticulitis or acute colonic inflammation. Vascular/Lymphatic: The abdominal aorta is normal in caliber. Portal vein is patent. No portal venous or mesenteric gas. Multiple small retroperitoneal mesenteric  nodes are not enlarged by size criteria. There is a small epitrochlear node measuring 8 mm. Reproductive: Hysterectomy with soft tissue prominence in the region of the cervix. Ovary is not definitively visualized on CT. Other: Small amount of scattered free fluid primarily in the pelvis. Mild edema of the mesentery and anterior omental fat. No free air or focal fluid collection. Musculoskeletal: There are no acute or suspicious osseous abnormalities. IMPRESSION: 1. No evidence of appendicitis. 2. Scattered fluid-filled small bowel loops with occasional areas of wall thickening, suggesting enteritis. No obstruction. 3. Small amount of scattered free fluid in the pelvis, likely reactive. Mild edema of the mesentery and anterior omental fat, typically reactive. 4. Trace bilateral pleural effusions and basilar atelectasis. Electronically Signed   By: Narda Rutherford M.D.   On: 03/06/2020 22:50    Procedures Procedures (including critical care time)  Medications Ordered in ED Medications  sodium chloride 0.9 % bolus 1,000 mL (0 mLs Intravenous Stopped 03/06/20 2213)  HYDROmorphone (DILAUDID) injection 1 mg (1 mg Intravenous Given 03/06/20 2103)  iohexol (OMNIPAQUE) 300 MG/ML solution 100 mL (100 mLs Intravenous Contrast Given 03/06/20 2227)  oxyCODONE-acetaminophen (PERCOCET/ROXICET) 5-325 MG per tablet 1 tablet (1 tablet Oral Given 03/07/20 0003)  ketorolac (TORADOL) 30 MG/ML injection 30 mg (30 mg Intravenous Given 03/07/20 0003)    ED Course  I have reviewed the triage vital signs and the nursing notes.  Pertinent labs & imaging results that were available during my care of the patient were reviewed by me and considered in my medical decision making (see chart for details).    MDM Rules/Calculators/A&P                          45 year old female presenting to the ED with a chief complaint of abdominal pain.  She had hysterectomy and hernia repair in November.  Pain began this morning.  Did have  intercourse last night and she is unsure if this caused the pain.  On exam patient has tenderness to the lower abdomen without rebound or guarding.  Denies any nausea, vomiting or diarrhea.  No urinary symptoms.  Did have some pink discharge with wiping her genital area.  No concern for STDs.  Pelvic exam with small amount of pink discharge but no cervical motion tenderness, adnexal tenderness or uterine tenderness.  Urinalysis without evidence of infection.  Wet prep shows many WBCs but otherwise unremarkable.  CBC, CMP unremarkable.  GC chlamydia probe pending.  CT of the abdomen pelvis with findings consistent with enteritis.  Suspect this could be the cause of her symptoms versus trauma from intercourse.  No evidence of any obvious tears.  She had improvement in her symptoms with medications given here.  Told her the importance of following up with her GYN surgeon.  In the meantime we will treat symptomatically.  Due to her unremarkable pelvic exam I doubt this is due to PID.  She elects to wait for results of her GC chlamydia probe when it is available for treatment.  Return precautions given.   Patient is hemodynamically stable, in NAD, and able to ambulate in the ED. Evaluation does not show pathology that would require ongoing emergent intervention or inpatient treatment. I explained the diagnosis to the patient. Pain has been managed and has no complaints prior to discharge. Patient is comfortable with above plan and is stable for discharge at this time. All questions were answered prior to disposition. Strict return precautions for returning to the ED were discussed. Encouraged follow up with PCP.   An After Visit Summary was printed and given to the patient.   Portions of this note were generated with Scientist, clinical (histocompatibility and immunogenetics). Dictation errors may occur despite best attempts at proofreading.  Final Clinical Impression(s) / ED Diagnoses Final diagnoses:  Enteritis    Rx / DC Orders ED  Discharge Orders         Ordered    naproxen (NAPROSYN) 500 MG tablet  2 times daily        03/07/20 0005    dicyclomine (BENTYL) 20 MG tablet  2 times daily        03/07/20 0005           Dietrich Pates, PA-C 03/07/20 0008    Sabino Donovan, MD 03/10/20 [phone]

## 2020-03-06 NOTE — ED Notes (Signed)
States in Nov 2021 had Abdominal Robotic Hysterectomy and Hernia Repair, done at [facility]. Here today stating abd pain has returned, onset today. Describes as sharp, stabbing pain, from abd radiating to her pain. Pt is tender to palpation

## 2020-03-06 NOTE — ED Notes (Signed)
Pt states she feels much better post IV med administration.

## 2020-03-07 MED ORDER — DICYCLOMINE HCL 20 MG PO TABS: 20.0000 mg | ORAL_TABLET | Freq: Two times a day (BID) | ORAL | 0 refills | Status: DC

## 2020-03-07 MED ORDER — NAPROXEN 500 MG PO TABS: 500.0000 mg | ORAL_TABLET | Freq: Two times a day (BID) | ORAL | 0 refills | Status: DC

## 2020-03-07 NOTE — Discharge Instructions (Addendum)
Follow-up with your OB/GYN. Take the medications as needed to help with your symptoms. Return to the ER if you start to experience worsening abdominal pain, bloody stools, vomiting, chest pain or shortness of breath.

## 2020-03-07 NOTE — ED Notes (Signed)
AVS reviewed with pt and husband, discussed dx with pt, also reviewed the two Rx's by EDP as well, informed pt that Rx were electronically sent to her pharmacy. Opportunity for questions provided as well.

## 2020-03-10 LAB — GC/CHLAMYDIA PROBE AMP (~~LOC~~) NOT AT ARMC
Chlamydia: NEGATIVE
Comment: NEGATIVE
Comment: NORMAL
Neisseria Gonorrhea: NEGATIVE

## 2020-03-14 ENCOUNTER — Inpatient Hospital Stay (HOSPITAL_BASED_OUTPATIENT_CLINIC_OR_DEPARTMENT_OTHER)
Admission: EM | Admit: 2020-03-14 | Discharge: 2020-03-23 | DRG: 853 | Disposition: A | Discharge status: Home / Self Care | Payer: BC Managed Care – PPO | Attending: Internal Medicine | Admitting: Internal Medicine

## 2020-03-14 ENCOUNTER — Emergency Department (HOSPITAL_BASED_OUTPATIENT_CLINIC_OR_DEPARTMENT_OTHER): Payer: BC Managed Care – PPO

## 2020-03-14 ENCOUNTER — Encounter (HOSPITAL_BASED_OUTPATIENT_CLINIC_OR_DEPARTMENT_OTHER): Payer: Self-pay

## 2020-03-14 ENCOUNTER — Other Ambulatory Visit: Payer: Self-pay

## 2020-03-14 DIAGNOSIS — I959 Hypotension, unspecified: Secondary | ICD-10-CM | POA: Diagnosis present

## 2020-03-14 DIAGNOSIS — A408 Other streptococcal sepsis: Principal | ICD-10-CM | POA: Diagnosis present

## 2020-03-14 DIAGNOSIS — Z8616 Personal history of COVID-19: Secondary | ICD-10-CM

## 2020-03-14 DIAGNOSIS — D649 Anemia, unspecified: Secondary | ICD-10-CM | POA: Diagnosis present

## 2020-03-14 DIAGNOSIS — U071 COVID-19: Secondary | ICD-10-CM | POA: Diagnosis present

## 2020-03-14 DIAGNOSIS — D6489 Other specified anemias: Secondary | ICD-10-CM | POA: Diagnosis present

## 2020-03-14 DIAGNOSIS — Z23 Encounter for immunization: Secondary | ICD-10-CM

## 2020-03-14 DIAGNOSIS — E1169 Type 2 diabetes mellitus with other specified complication: Secondary | ICD-10-CM | POA: Diagnosis present

## 2020-03-14 DIAGNOSIS — R188 Other ascites: Secondary | ICD-10-CM

## 2020-03-14 DIAGNOSIS — L0291 Cutaneous abscess, unspecified: Secondary | ICD-10-CM

## 2020-03-14 DIAGNOSIS — Z90710 Acquired absence of both cervix and uterus: Secondary | ICD-10-CM

## 2020-03-14 DIAGNOSIS — Z7984 Long term (current) use of oral hypoglycemic drugs: Secondary | ICD-10-CM

## 2020-03-14 DIAGNOSIS — A419 Sepsis, unspecified organism: Principal | ICD-10-CM

## 2020-03-14 DIAGNOSIS — I471 Supraventricular tachycardia: Secondary | ICD-10-CM | POA: Diagnosis present

## 2020-03-14 DIAGNOSIS — Z6837 Body mass index (BMI) 37.0-37.9, adult: Secondary | ICD-10-CM

## 2020-03-14 DIAGNOSIS — I10 Essential (primary) hypertension: Secondary | ICD-10-CM | POA: Diagnosis present

## 2020-03-14 DIAGNOSIS — Z7952 Long term (current) use of systemic steroids: Secondary | ICD-10-CM

## 2020-03-14 DIAGNOSIS — N170 Acute kidney failure with tubular necrosis: Secondary | ICD-10-CM | POA: Diagnosis present

## 2020-03-14 DIAGNOSIS — E785 Hyperlipidemia, unspecified: Secondary | ICD-10-CM | POA: Diagnosis present

## 2020-03-14 DIAGNOSIS — Z79899 Other long term (current) drug therapy: Secondary | ICD-10-CM

## 2020-03-14 DIAGNOSIS — K567 Ileus, unspecified: Secondary | ICD-10-CM | POA: Diagnosis present

## 2020-03-14 DIAGNOSIS — T50995A Adverse effect of other drugs, medicaments and biological substances, initial encounter: Secondary | ICD-10-CM | POA: Diagnosis present

## 2020-03-14 DIAGNOSIS — Y92239 Unspecified place in hospital as the place of occurrence of the external cause: Secondary | ICD-10-CM | POA: Diagnosis present

## 2020-03-14 DIAGNOSIS — N739 Female pelvic inflammatory disease, unspecified: Secondary | ICD-10-CM | POA: Diagnosis not present

## 2020-03-14 DIAGNOSIS — D709 Neutropenia, unspecified: Secondary | ICD-10-CM | POA: Diagnosis present

## 2020-03-14 DIAGNOSIS — K529 Noninfective gastroenteritis and colitis, unspecified: Secondary | ICD-10-CM | POA: Diagnosis present

## 2020-03-14 DIAGNOSIS — K660 Peritoneal adhesions (postprocedural) (postinfection): Secondary | ICD-10-CM | POA: Diagnosis present

## 2020-03-14 DIAGNOSIS — E872 Acidosis: Secondary | ICD-10-CM | POA: Diagnosis present

## 2020-03-14 DIAGNOSIS — Z791 Long term (current) use of non-steroidal anti-inflammatories (NSAID): Secondary | ICD-10-CM

## 2020-03-14 DIAGNOSIS — R1110 Vomiting, unspecified: Secondary | ICD-10-CM

## 2020-03-14 HISTORY — DX: Essential (primary) hypertension: I10

## 2020-03-14 HISTORY — DX: Type 2 diabetes mellitus without complications: E11.9

## 2020-03-14 LAB — CBC WITH DIFFERENTIAL/PLATELET
Abs Immature Granulocytes: 0 10*3/uL (ref 0.00–0.07)
Basophils Absolute: 0 10*3/uL (ref 0.0–0.1)
Basophils Relative: 1 %
Eosinophils Absolute: 0 10*3/uL (ref 0.0–0.5)
Eosinophils Relative: 1 %
HCT: 33.9 % — ABNORMAL LOW (ref 36.0–46.0)
Hemoglobin: 11.1 g/dL — ABNORMAL LOW (ref 12.0–15.0)
Immature Granulocytes: 0 %
Lymphocytes Relative: 9 %
Lymphs Abs: 0.2 10*3/uL — ABNORMAL LOW (ref 0.7–4.0)
MCH: 26.8 pg (ref 26.0–34.0)
MCHC: 32.7 g/dL (ref 30.0–36.0)
MCV: 81.9 fL (ref 80.0–100.0)
Monocytes Absolute: 0 10*3/uL — ABNORMAL LOW (ref 0.1–1.0)
Monocytes Relative: 0 %
Neutro Abs: 2 10*3/uL (ref 1.7–7.7)
Neutrophils Relative %: 89 %
Platelets: 242 10*3/uL (ref 150–400)
RBC: 4.14 MIL/uL (ref 3.87–5.11)
RDW: 13.9 % (ref 11.5–15.5)
Smear Review: NORMAL
WBC: 2.2 10*3/uL — ABNORMAL LOW (ref 4.0–10.5)
nRBC: 0 % (ref 0.0–0.2)

## 2020-03-14 LAB — COMPREHENSIVE METABOLIC PANEL
ALT: 16 U/L (ref 0–44)
AST: 18 U/L (ref 15–41)
Albumin: 3.1 g/dL — ABNORMAL LOW (ref 3.5–5.0)
Alkaline Phosphatase: 61 U/L (ref 38–126)
Anion gap: 11 (ref 5–15)
BUN: 13 mg/dL (ref 6–20)
CO2: 24 mmol/L (ref 22–32)
Calcium: 8.2 mg/dL — ABNORMAL LOW (ref 8.9–10.3)
Chloride: 102 mmol/L (ref 98–111)
Creatinine, Ser: 0.67 mg/dL (ref 0.44–1.00)
GFR, Estimated: >60 mL/min (ref >60)
Glucose, Bld: 126 mg/dL — ABNORMAL HIGH (ref 70–99)
Potassium: 3.6 mmol/L (ref 3.5–5.1)
Sodium: 137 mmol/L (ref 135–145)
Total Bilirubin: 0.2 mg/dL — ABNORMAL LOW (ref 0.3–1.2)
Total Protein: 6.5 g/dL (ref 6.5–8.1)

## 2020-03-14 LAB — LACTIC ACID, PLASMA: Lactic Acid, Venous: 1.6 mmol/L (ref 0.5–1.9)

## 2020-03-14 LAB — SARS CORONAVIRUS 2 BY RT PCR (HOSPITAL ORDER, PERFORMED IN ~~LOC~~ HOSPITAL LAB): SARS Coronavirus 2: POSITIVE — AB

## 2020-03-14 LAB — LIPASE, BLOOD: Lipase: 22 U/L (ref 11–51)

## 2020-03-14 MED ORDER — LACTATED RINGERS IV BOLUS
1000.0000 mL | Freq: Once | INTRAVENOUS | Status: AC
  Administered 2020-03-14: 1000 mL via INTRAVENOUS

## 2020-03-14 MED ORDER — ONDANSETRON HCL 4 MG/2ML IJ SOLN
4.0000 mg | Freq: Once | INTRAMUSCULAR | Status: AC
  Administered 2020-03-14: 4 mg via INTRAVENOUS
  Filled 2020-03-14: qty 2

## 2020-03-14 MED ORDER — IOHEXOL 300 MG/ML  SOLN
100.0000 mL | Freq: Once | INTRAMUSCULAR | Status: AC
  Administered 2020-03-14: 100 mL via INTRAVENOUS

## 2020-03-14 MED ORDER — ACETAMINOPHEN 500 MG PO TABS
1000.0000 mg | ORAL_TABLET | Freq: Once | ORAL | Status: AC
  Administered 2020-03-14: 1000 mg via ORAL
  Filled 2020-03-14: qty 2

## 2020-03-14 MED ORDER — FENTANYL CITRATE (PF) 100 MCG/2ML IJ SOLN
50.0000 ug | Freq: Once | INTRAMUSCULAR | Status: AC
  Administered 2020-03-14: 50 ug via INTRAVENOUS
  Filled 2020-03-14: qty 2

## 2020-03-14 MED ORDER — PIPERACILLIN-TAZOBACTAM 3.375 G IVPB 30 MIN
3.3750 g | Freq: Once | INTRAVENOUS | Status: AC
  Administered 2020-03-14: 3.375 g via INTRAVENOUS
  Filled 2020-03-14: qty 50

## 2020-03-14 NOTE — ED Notes (Signed)
Pt. Abd. Is becoming more distended.  Made EDP aware.  Pt. In more pain.  Fentanyl given that was ordered.  Pt. Had had dose given as she entered ED on arrival.  B/P soft so Pt. Not given dose ordered by EDP immediately.  Pt resting at this time less restless after meds and VSS.

## 2020-03-14 NOTE — ED Provider Notes (Signed)
[facility] EMERGENCY DEPARTMENT Provider Note   CSN: [id number] Arrival date & time: 03/14/20  1834     History Chief Complaint  Patient presents with  . Abdominal Pain    Katherine Cabrera is a 45 y.o. female.  Presents to ER with concern for worsening abdominal pain.  Around 2 weeks ago she had COVID, generally mild symptoms.  Tested positive on 1/8.  Came to ER on 1/13 with some abdominal pain.  Diagnosed with enteritis and discharged home.  She states that her symptoms of abdominal pain had improved but then seem to come back over the past couple days, today pain was much more severe.  States that while she was trying to have a bowel movement she started shaking and became nauseous and lightheaded.  Did not pass out.  Also having some nausea, no vomiting.  Chills.  Noted a small amount of vaginal bleeding no vaginal discharge.  November 2021 s/p robotic umbilical hernia repair with mesh and concomitant robotic hysterectomy, bilateral salpingectomy and lysis of adhesions (by Dr. Janae Sauce);      HPI     Past Medical History:  Diagnosis Date  . De Quervain's disease (radial styloid tenosynovitis)     Patient Active Problem List   Diagnosis Date Noted  . Sepsis (HCC) 03/14/2020    Past Surgical History:  Procedure Laterality Date  . ABDOMINAL HYSTERECTOMY    . CESAREAN SECTION    . HERNIA REPAIR       OB History   No obstetric history on file.     History reviewed. No pertinent family history.  Social History   Tobacco Use  . Smoking status: Never Smoker  . Smokeless tobacco: Never Used  Substance Use Topics  . Alcohol use: No  . Drug use: No    Home Medications Prior to Admission medications   Medication Sig Start Date End Date Taking? Authorizing Provider  dicyclomine (BENTYL) 20 MG tablet Take 1 tablet (20 mg total) by mouth 2 (two) times daily. 03/07/20   Khatri, Hina, PA-C  naproxen (NAPROSYN) 500 MG tablet Take 1 tablet (500 mg total) by mouth 2  (two) times daily. 03/07/20   Khatri, Hina, PA-C  predniSONE (DELTASONE) 20 MG tablet Take 3 tablets (60 mg total) by mouth daily. 09/22/15   Ward, Layla Maw, DO     Allergies    Patient has no known allergies.  Review of Systems   Review of Systems  Constitutional: Negative for chills and fever.  HENT: Negative for ear pain and sore throat.   Eyes: Negative for pain and visual disturbance.  Respiratory: Negative for cough and shortness of breath.   Cardiovascular: Negative for chest pain and palpitations.  Gastrointestinal: Positive for abdominal pain, constipation and nausea. Negative for diarrhea and vomiting.  Genitourinary: Negative for dysuria and hematuria.  Musculoskeletal: Negative for arthralgias and back pain.  Skin: Negative for color change and rash.  Neurological: Negative for seizures and syncope.  All other systems reviewed and are negative.   Physical Exam Updated Vital Signs BP 109/74   Pulse (!) 114   Temp 98.3 F (36.8 C) (Oral)   Resp 18   Ht 5\' 3"  (1.6 m)   Wt 96.2 kg   LMP 05/07/2019   SpO2 97%   BMI 37.55 kg/m   Physical Exam Vitals and nursing note reviewed.  Constitutional:      General: She is not in acute distress.    Appearance: She is well-developed and well-nourished.  Comments: Somewhat ill-appearing but in no distress  HENT:     Head: Normocephalic and atraumatic.  Eyes:     Conjunctiva/sclera: Conjunctivae normal.  Cardiovascular:     Rate and Rhythm: Regular rhythm. Tachycardia present.     Heart sounds: No murmur heard.   Pulmonary:     Effort: Pulmonary effort is normal. No respiratory distress.     Breath sounds: Normal breath sounds.  Abdominal:     Comments: Some distention, generalized tenderness to palpation, there is voluntary guarding and some rebound tenderness as well  Musculoskeletal:        General: No edema.     Cervical back: Neck supple.  Skin:    General: Skin is warm and dry.  Neurological:     Mental  Status: She is alert.  Psychiatric:        Mood and Affect: Mood and affect normal.     ED Results / Procedures / Treatments   Labs (all labs ordered are listed, but only abnormal results are displayed) Labs Reviewed  SARS CORONAVIRUS 2 BY RT PCR (HOSPITAL ORDER, PERFORMED IN [facility]) - Abnormal; Notable for the following components:      Result Value   SARS Coronavirus 2 POSITIVE (*)    All other components within normal limits  COMPREHENSIVE METABOLIC PANEL - Abnormal; Notable for the following components:   Glucose, Bld 126 (*)    Calcium 8.2 (*)    Albumin 3.1 (*)    Total Bilirubin 0.2 (*)    All other components within normal limits  CBC WITH DIFFERENTIAL/PLATELET - Abnormal; Notable for the following components:   WBC 2.2 (*)    Hemoglobin 11.1 (*)    HCT 33.9 (*)    Lymphs Abs 0.2 (*)    Monocytes Absolute 0.0 (*)    All other components within normal limits  URINE CULTURE  CULTURE, BLOOD (ROUTINE X 2)  CULTURE, BLOOD (ROUTINE X 2)  LIPASE, BLOOD  LACTIC ACID, PLASMA  URINALYSIS, ROUTINE W REFLEX MICROSCOPIC  LACTIC ACID, PLASMA    EKG EKG Interpretation  Date/Time:  Friday March 14 2020 18:43:04 EST Ventricular Rate:  130 PR Interval:  118 QRS Duration: 82 QT Interval:  310 QTC Calculation: 456 R Axis:   106 Text Interpretation: Sinus tachycardia Rightward axis Nonspecific T wave abnormality Abnormal ECG Confirmed by Marianna Fuss ([id number]) on 03/14/2020 7:52:06 PM   Radiology CT ABDOMEN PELVIS W CONTRAST  Result Date: 03/14/2020 CLINICAL DATA:  Right-sided abdominal pain EXAM: CT ABDOMEN AND PELVIS WITH CONTRAST TECHNIQUE: Multidetector CT imaging of the abdomen and pelvis was performed using the standard protocol following bolus administration of intravenous contrast. CONTRAST:  OMNIPAQUE IOHEXOL 300 MG/ML  SOLN COMPARISON:  03/06/2020 FINDINGS: Lower chest: Mild dependent atelectatic changes are noted bilaterally. Hepatobiliary:  Liver and gallbladder are within normal limits. There is new free fluid surrounding the majority of the liver when compared with the prior exam. Pancreas: Unremarkable. No pancreatic ductal dilatation or surrounding inflammatory changes. Spleen: Normal in size without focal abnormality. Adrenals/Urinary Tract: Adrenal glands are within normal limits. The kidneys demonstrate a normal enhancement pattern bilaterally. No renal calculi are seen. No obstructive changes are noted. The bladder is decompressed. Stomach/Bowel: The stomach is within normal limits. Mild fluid dilatation of the small bowel is noted without definitive obstructive changes. The colon is predominately decompressed. The appendix is well visualized and shows no inflammatory changes. Mild fluid is noted surrounding the small bowel loops consistent with enteritis.  There are peripherally enhancing fluid collections identified deep within the pelvis. One of these is noted anterior to the rectum in the pelvic cul-de-sac measuring 5.8 x 3.3 cm best seen on image number 74 of series 2. These do not correspond to any bowel loops and are felt to represent developing abscesses. No definitive free air is seen. This previously represented some minimal free fluid within the pelvis. A second collection is noted just superior to the left measuring 4.5 cm best seen on image number 73 of series 2. Vascular/Lymphatic: No significant vascular findings are present. No enlarged abdominal or pelvic lymph nodes. Reproductive: Status post hysterectomy. No adnexal masses. Other: As described above there are 2 areas of peripherally enhancing fluid collection deep within the pelvis consistent with developing abscesses. Some free fluid is also noted within the abdomen likely reactive in nature. Musculoskeletal: No acute or significant osseous findings. IMPRESSION: Peripherally enhancing fluid collections deep within the pelvis consistent with developing abscesses. Free fluid is  also noted within the abdomen. These changes are new from the prior exam. Fluid-filled loops of small bowel with mild mucosal hyperemia consistent with enteritis and likely in part reactive to the changes in the pelvis. Normal-appearing appendix. Electronically Signed   By: Alcide Clever M.D.   On: 03/14/2020 21:32   DG Chest Portable 1 View  Result Date: 03/14/2020 CLINICAL DATA:  COVID positive.  Fever and abdominal pain EXAM: PORTABLE CHEST 1 VIEW COMPARISON:  09/21/2015 FINDINGS: Heart size and pulmonary vascularity are normal. Lungs are clear. No airspace disease or consolidation. No pleural effusions. No pneumothorax. Mediastinal contours appear intact. IMPRESSION: No active disease. Electronically Signed   By: Burman Nieves M.D.   On: 03/14/2020 21:22    Procedures .Critical Care Performed by: Milagros Loll, MD Authorized by: Milagros Loll, MD   Critical care provider statement:    Critical care time (minutes):  45   Critical care was necessary to treat or prevent imminent or life-threatening deterioration of the following conditions:  Sepsis   Critical care was time spent personally by me on the following activities:  Discussions with consultants, evaluation of patient's response to treatment, examination of patient, ordering and performing treatments and interventions, ordering and review of laboratory studies, ordering and review of radiographic studies, pulse oximetry, re-evaluation of patient's condition, obtaining history from patient or surrogate and review of old charts   (including critical care time)  Medications Ordered in ED Medications  acetaminophen (TYLENOL) tablet 1,000 mg (1,000 mg Oral Given 03/14/20 1941)  lactated ringers bolus 1,000 mL (0 mLs Intravenous Stopped 03/14/20 2049)  fentaNYL (SUBLIMAZE) injection 50 mcg (50 mcg Intravenous Given 03/14/20 2027)  ondansetron (ZOFRAN) injection 4 mg (4 mg Intravenous Given 03/14/20 1949)  lactated ringers bolus  1,000 mL ( Intravenous Stopped 03/14/20 2226)  piperacillin-tazobactam (ZOSYN) IVPB 3.375 g ( Intravenous Stopped 03/14/20 2113)  iohexol (OMNIPAQUE) 300 MG/ML solution 100 mL (100 mLs Intravenous Contrast Given 03/14/20 2058)  lactated ringers bolus 1,000 mL (1,000 mLs Intravenous New Bag/Given 03/14/20 2301)  fentaNYL (SUBLIMAZE) injection 50 mcg (50 mcg Intravenous Given 03/14/20 2302)    ED Course  I have reviewed the triage vital signs and the nursing notes.  Pertinent labs & imaging results that were available during my care of the patient were reviewed by me and considered in my medical decision making (see chart for details).    MDM Rules/Calculators/A&P  45 year old lady presenting to ER with concern for worsening abdominal pain, recent COVID.  Notable surgical history for hernia repair and hysterectomy in November.  On presentation to ER, febrile, tachycardic but blood pressure stable.  Commence broad work-up, obtain cultures, labs, CT.  Patient became hypotensive while in ER, raising concern for sepsis.  Provided additional fluid bolus, started broad-spectrum antibiotics.  CT scan concerning for pelvic fluid collections, abdominal free fluid.  Concern this is source of her fever and presentation today.  Reviewed with her primary surgeon, [facility].  He does not think this is related to a postoperative complaint.  [facility] is not excepting any outside patients at this time.  Reviewed case with our general surgeon on-call, Dr. Dwain SarnaWakefield, no emergent surgical interventions at this time.  If anything, would recommend discussion with IR for drainage.  Due to her hypotension, concern for worsening sepsis, initially reviewed case with critical care, Dr. Kendrick FriesMcquaid who agreed to admit patient.  Her lactic acid came back normal and patient's blood pressure improved while receiving her third liter of fluids.  After further discussion, he  requests hospital admit.  Reassessed patient and she is mentating well and believe this is a reasonable option at this time.  Final Clinical Impression(s) / ED Diagnoses Final diagnoses:  Sepsis, due to unspecified organism, unspecified whether acute organ dysfunction present (HCC)  Abscess of female pelvis  Neutropenia, unspecified type [facility](HCC)    Rx / DC Orders ED Discharge Orders    None       Milagros Lollykstra, Marikay Roads S, MD 03/15/20 0001

## 2020-03-14 NOTE — ED Triage Notes (Addendum)
Pt has recent diagnosis of colitis. Today was attempting to have BM and started having tremors and lower abdominal pain. Unsuccessful BM pta. C/o nausea. Last normal BM yesterday. Given of fentanyl ( upon entering ED) by EMS

## 2020-03-15 DIAGNOSIS — E1169 Type 2 diabetes mellitus with other specified complication: Secondary | ICD-10-CM | POA: Diagnosis present

## 2020-03-15 DIAGNOSIS — Z791 Long term (current) use of non-steroidal anti-inflammatories (NSAID): Secondary | ICD-10-CM | POA: Diagnosis not present

## 2020-03-15 DIAGNOSIS — N739 Female pelvic inflammatory disease, unspecified: Secondary | ICD-10-CM | POA: Diagnosis present

## 2020-03-15 DIAGNOSIS — Y92239 Unspecified place in hospital as the place of occurrence of the external cause: Secondary | ICD-10-CM | POA: Diagnosis present

## 2020-03-15 DIAGNOSIS — Z8616 Personal history of COVID-19: Secondary | ICD-10-CM | POA: Diagnosis not present

## 2020-03-15 DIAGNOSIS — U071 COVID-19: Secondary | ICD-10-CM | POA: Diagnosis present

## 2020-03-15 DIAGNOSIS — Z79899 Other long term (current) drug therapy: Secondary | ICD-10-CM | POA: Diagnosis not present

## 2020-03-15 DIAGNOSIS — I959 Hypotension, unspecified: Secondary | ICD-10-CM

## 2020-03-15 DIAGNOSIS — E872 Acidosis: Secondary | ICD-10-CM | POA: Diagnosis present

## 2020-03-15 DIAGNOSIS — E785 Hyperlipidemia, unspecified: Secondary | ICD-10-CM | POA: Diagnosis present

## 2020-03-15 DIAGNOSIS — Z7952 Long term (current) use of systemic steroids: Secondary | ICD-10-CM | POA: Diagnosis not present

## 2020-03-15 DIAGNOSIS — K660 Peritoneal adhesions (postprocedural) (postinfection): Secondary | ICD-10-CM | POA: Diagnosis present

## 2020-03-15 DIAGNOSIS — D708 Other neutropenia: Secondary | ICD-10-CM | POA: Diagnosis not present

## 2020-03-15 DIAGNOSIS — D709 Neutropenia, unspecified: Secondary | ICD-10-CM | POA: Diagnosis present

## 2020-03-15 DIAGNOSIS — K529 Noninfective gastroenteritis and colitis, unspecified: Secondary | ICD-10-CM | POA: Diagnosis present

## 2020-03-15 DIAGNOSIS — Z23 Encounter for immunization: Secondary | ICD-10-CM | POA: Diagnosis not present

## 2020-03-15 DIAGNOSIS — Z9889 Other specified postprocedural states: Secondary | ICD-10-CM | POA: Diagnosis not present

## 2020-03-15 DIAGNOSIS — I471 Supraventricular tachycardia: Secondary | ICD-10-CM | POA: Diagnosis present

## 2020-03-15 DIAGNOSIS — D649 Anemia, unspecified: Secondary | ICD-10-CM | POA: Diagnosis not present

## 2020-03-15 DIAGNOSIS — Z6837 Body mass index (BMI) 37.0-37.9, adult: Secondary | ICD-10-CM | POA: Diagnosis not present

## 2020-03-15 DIAGNOSIS — T50995A Adverse effect of other drugs, medicaments and biological substances, initial encounter: Secondary | ICD-10-CM | POA: Diagnosis present

## 2020-03-15 DIAGNOSIS — D6489 Other specified anemias: Secondary | ICD-10-CM | POA: Diagnosis present

## 2020-03-15 DIAGNOSIS — A419 Sepsis, unspecified organism: Secondary | ICD-10-CM

## 2020-03-15 DIAGNOSIS — K567 Ileus, unspecified: Secondary | ICD-10-CM | POA: Diagnosis present

## 2020-03-15 DIAGNOSIS — R188 Other ascites: Secondary | ICD-10-CM | POA: Diagnosis not present

## 2020-03-15 DIAGNOSIS — Z90710 Acquired absence of both cervix and uterus: Secondary | ICD-10-CM | POA: Diagnosis not present

## 2020-03-15 DIAGNOSIS — Z7984 Long term (current) use of oral hypoglycemic drugs: Secondary | ICD-10-CM | POA: Diagnosis not present

## 2020-03-15 DIAGNOSIS — N170 Acute kidney failure with tubular necrosis: Secondary | ICD-10-CM | POA: Diagnosis present

## 2020-03-15 DIAGNOSIS — I10 Essential (primary) hypertension: Secondary | ICD-10-CM | POA: Diagnosis present

## 2020-03-15 DIAGNOSIS — A408 Other streptococcal sepsis: Secondary | ICD-10-CM | POA: Diagnosis present

## 2020-03-15 LAB — HIV ANTIBODY (ROUTINE TESTING W REFLEX): HIV Screen 4th Generation wRfx: NONREACTIVE

## 2020-03-15 LAB — HEMOGLOBIN AND HEMATOCRIT, BLOOD
HCT: 36.6 % (ref 36.0–46.0)
Hemoglobin: 11.6 g/dL — ABNORMAL LOW (ref 12.0–15.0)

## 2020-03-15 LAB — TYPE AND SCREEN
ABO/RH(D): B POS
Antibody Screen: NEGATIVE

## 2020-03-15 LAB — GLUCOSE, CAPILLARY: Glucose-Capillary: 100 mg/dL — ABNORMAL HIGH (ref 70–99)

## 2020-03-15 LAB — ABO/RH: ABO/RH(D): B POS

## 2020-03-15 LAB — LACTIC ACID, PLASMA: Lactic Acid, Venous: 1.8 mmol/L (ref 0.5–1.9)

## 2020-03-15 MED ORDER — ALBUTEROL SULFATE (2.5 MG/3ML) 0.083% IN NEBU: 2.5000 mg | INHALATION_SOLUTION | Freq: Four times a day (QID) | RESPIRATORY_TRACT | Status: DC | PRN

## 2020-03-15 MED ORDER — GUAIFENESIN-DM 100-10 MG/5ML PO SYRP: 10.0000 mL | ORAL_SOLUTION | ORAL | Status: DC | PRN

## 2020-03-15 MED ORDER — ONDANSETRON HCL 4 MG/2ML IJ SOLN
4.0000 mg | Freq: Four times a day (QID) | INTRAMUSCULAR | Status: DC | PRN
  Administered 2020-03-16 – 2020-03-17 (×2): 4 mg via INTRAVENOUS
  Filled 2020-03-15 (×2): qty 2

## 2020-03-15 MED ORDER — ACETAMINOPHEN 650 MG RE SUPP: 650.0000 mg | Freq: Four times a day (QID) | RECTAL | Status: DC | PRN

## 2020-03-15 MED ORDER — FAMOTIDINE IN NACL 20-0.9 MG/50ML-% IV SOLN
20.0000 mg | Freq: Two times a day (BID) | INTRAVENOUS | Status: DC
  Administered 2020-03-16: 20 mg via INTRAVENOUS
  Filled 2020-03-15 (×5): qty 50

## 2020-03-15 MED ORDER — FENTANYL CITRATE (PF) 100 MCG/2ML IJ SOLN: 25.0000 ug | INTRAMUSCULAR | Status: DC | PRN

## 2020-03-15 MED ORDER — SODIUM CHLORIDE 0.9 % IV SOLN: INTRAVENOUS | Status: DC

## 2020-03-15 MED ORDER — ATORVASTATIN CALCIUM 40 MG PO TABS
40.0000 mg | ORAL_TABLET | Freq: Every day | ORAL | Status: DC
  Administered 2020-03-15 – 2020-03-17 (×3): 40 mg via ORAL
  Filled 2020-03-15 (×3): qty 1

## 2020-03-15 MED ORDER — MORPHINE SULFATE (PF) 4 MG/ML IV SOLN
4.0000 mg | Freq: Once | INTRAVENOUS | Status: AC
Start: 2020-03-15 — End: 2020-03-15
  Administered 2020-03-15: 4 mg via INTRAVENOUS
  Filled 2020-03-15: qty 1

## 2020-03-15 MED ORDER — CALCIUM GLUCONATE-NACL 1-0.675 GM/50ML-% IV SOLN
1.0000 g | Freq: Once | INTRAVENOUS | Status: AC
  Administered 2020-03-15: 1000 mg via INTRAVENOUS
  Filled 2020-03-15: qty 50

## 2020-03-15 MED ORDER — ENOXAPARIN SODIUM 40 MG/0.4ML ~~LOC~~ SOLN
40.0000 mg | SUBCUTANEOUS | Status: DC
  Filled 2020-03-15: qty 0.4

## 2020-03-15 MED ORDER — VANCOMYCIN HCL IN DEXTROSE 1-5 GM/200ML-% IV SOLN
1000.0000 mg | Freq: Once | INTRAVENOUS | Status: AC
  Administered 2020-03-15: 1000 mg via INTRAVENOUS
  Filled 2020-03-15: qty 200

## 2020-03-15 MED ORDER — HYDROCOD POLST-CPM POLST ER 10-8 MG/5ML PO SUER
5.0000 mL | Freq: Two times a day (BID) | ORAL | Status: DC | PRN
Start: 2020-03-15 — End: 2020-03-23

## 2020-03-15 MED ORDER — PIPERACILLIN-TAZOBACTAM 3.375 G IVPB
3.3750 g | Freq: Three times a day (TID) | INTRAVENOUS | Status: DC
  Administered 2020-03-15 – 2020-03-17 (×5): 3.375 g via INTRAVENOUS
  Filled 2020-03-15 (×6): qty 50

## 2020-03-15 MED ORDER — MORPHINE SULFATE (PF) 4 MG/ML IV SOLN
4.0000 mg | Freq: Once | INTRAVENOUS | Status: AC
  Administered 2020-03-15: 4 mg via INTRAVENOUS
  Filled 2020-03-15: qty 1

## 2020-03-15 MED ORDER — SODIUM CHLORIDE 0.9 % IV BOLUS
1000.0000 mL | Freq: Once | INTRAVENOUS | Status: AC
  Administered 2020-03-15: 1000 mL via INTRAVENOUS

## 2020-03-15 MED ORDER — ACETAMINOPHEN 325 MG PO TABS
650.0000 mg | ORAL_TABLET | Freq: Four times a day (QID) | ORAL | Status: DC | PRN
  Administered 2020-03-16 – 2020-03-23 (×4): 650 mg via ORAL
  Filled 2020-03-15 (×4): qty 2

## 2020-03-15 MED ORDER — ONDANSETRON HCL 4 MG PO TABS
4.0000 mg | ORAL_TABLET | Freq: Four times a day (QID) | ORAL | Status: DC | PRN
  Filled 2020-03-15: qty 1

## 2020-03-15 MED ORDER — INSULIN ASPART 100 UNIT/ML ~~LOC~~ SOLN
0.0000 [IU] | Freq: Three times a day (TID) | SUBCUTANEOUS | Status: DC
  Administered 2020-03-18 (×2): 1 [IU] via SUBCUTANEOUS
  Administered 2020-03-20: 2 [IU] via SUBCUTANEOUS
  Administered 2020-03-21 – 2020-03-23 (×4): 1 [IU] via SUBCUTANEOUS

## 2020-03-15 MED ORDER — ENOXAPARIN SODIUM 40 MG/0.4ML ~~LOC~~ SOLN: 40.0000 mg | SUBCUTANEOUS | Status: DC

## 2020-03-15 MED ORDER — VANCOMYCIN HCL IN DEXTROSE 1-5 GM/200ML-% IV SOLN
1000.0000 mg | Freq: Two times a day (BID) | INTRAVENOUS | Status: DC
  Administered 2020-03-15 – 2020-03-16 (×2): 1000 mg via INTRAVENOUS
  Filled 2020-03-15 (×2): qty 200

## 2020-03-15 NOTE — H&P (Addendum)
History and Physical    Katherine Gilkison WUJ:[id number] DOB: 04-25-1975 DOA: 03/14/2020  Referring MD/NP/PA:Jan Arville Care, MD PCP: Patient, No Pcp Per  Patient coming from: Transferred from [facility]  Chief Complaint: Chills and abdominal pain  I have personally briefly reviewed patient's old medical records in [facility]   HPI: Elisse Pennick is a 45 y.o. female with medical history significant of recent COVID infection, umbilical hernia repair, hysterectomy, and bilateral salpingo-oophorectomy with lysis of adhesions in November 2021 by Dr.Louk presents with complaints of abdominal pain and chills.  Patient reports that she was diagnosed with COVID-19 on 1/8 secondary to a work exposure.  She had previously had her [employer] vaccine last year and her and her husband were scheduled to get the booster soon.  She reported experiencing fever and mild cough.  The following week she developed abdominal pain and was seen in the ER on 1/13 was diagnosed with enteritis sent home with dicyclomine and naproxen.  Thereafter patient reported that she seemed to be doing well and just had some mild discomfort on the right side of her abdomen.  However, yesterday after work when she got home while trying to have bowel movement had acute onset of severe shaking chills.  Associated symptoms of severe abdominal pain that radiated across her stomach, nausea, vaginal bleeding, and lightheadedness.  Denies any vomiting or loss of consciousness.   ED Course: Upon admission patient was seen to be febrile up to 103.6 F, pulse elevated up to 139, respirations 26-30, blood pressure as low as 81/45 with improvement with IV fluid boluses to 113/64, and O2 saturations maintained on room air.  Labs were significant for WBC 2.2, hemoglobin 11.1, and lactic acid 1.6.  Chest x-ray showed no acute abnormalities.  CT of the abdomen and pelvis with contrast concerning for peripherally enhancing fluid collections deep within the  pelvis consistent with developing abscesses and fluid-filled loops of small bowel with mild mucosal hyperemia consistent with enteritis.  Case had been discussed with Dr. Janae Sauce gynecologist at [facility], but did not think related to a postoperative complication and [place] [facility] was not excepting outside patient at this time.  COVID-19 screening was positive.  Sepsis protocol had been initiated patient had been given full fluid bolus of 2 L of lactated Ringer's, antiemetics, acetaminophen 1000 mg p.o., pain medication, vancomycin, and Zosyn.  Case had been discussed with PCCM due to hypotension, but hospitalist admission recommended as lactic acid reassuring with improving blood pressures with IV fluids.  IR was formally consulted for abscess drainage after talks with general surgery.  Review of Systems  Constitutional: Positive for chills and malaise/fatigue.  HENT: Negative for congestion and nosebleeds.   Eyes: Negative for photophobia and pain.  Respiratory: Positive for cough and shortness of breath.   Cardiovascular: Negative for chest pain and leg swelling.  Gastrointestinal: Positive for abdominal pain and nausea. Negative for diarrhea and vomiting.  Genitourinary: Positive for hematuria. Negative for dysuria.  Musculoskeletal: Negative for falls.  Skin: Negative for rash.  Neurological: Negative for loss of consciousness and headaches.  Psychiatric/Behavioral: Negative for memory loss and substance abuse.    Past Medical History:  Diagnosis Date  . De Quervain's disease (radial styloid tenosynovitis)     Past Surgical History:  Procedure Laterality Date  . ABDOMINAL HYSTERECTOMY    . CESAREAN SECTION    . HERNIA REPAIR       reports that she has never smoked. She has never used smokeless tobacco.  She reports that she does not drink alcohol and does not use drugs.  No Known Allergies  History reviewed. No pertinent family history.  Prior to Admission medications    Medication Sig Start Date End Date Taking? Authorizing Provider  dicyclomine (BENTYL) 20 MG tablet Take 1 tablet (20 mg total) by mouth 2 (two) times daily. 03/07/20   Khatri, Hina, PA-C  naproxen (NAPROSYN) 500 MG tablet Take 1 tablet (500 mg total) by mouth 2 (two) times daily. 03/07/20   Khatri, Hina, PA-C  predniSONE (DELTASONE) 20 MG tablet Take 3 tablets (60 mg total) by mouth daily. 09/22/15   Ward, Layla Maw, DO    Physical Exam:  Constitutional: Middle-aged female who appears to be acutely ill Vitals:   03/15/20 0600 03/15/20 0630 03/15/20 0700 03/15/20 0730  BP: 106/61 110/68 113/64 108/68  Pulse: (!) 110 (!) 115 (!) 114 (!) 108  Resp: 20 (!) 21 (!) 26 (!) 29  Temp:  98.4 F (36.9 C)    TempSrc:  Oral    SpO2: 98% 98% 97% 97%  Weight:      Height:       Eyes: PERRL, lids and conjunctivae normal ENMT: Mucous membranes are dry. Posterior pharynx clear of any exudate or lesions.  Neck: normal, supple, no masses, no thyromegaly Respiratory: clear to auscultation bilaterally, no wheezing, no crackles. Normal respiratory effort. No accessory muscle use.  Cardiovascular: Regular rate and rhythm, no murmurs / rubs / gallops. No extremity edema. 2+ pedal pulses. No carotid bruits.  Abdomen: no tenderness, no masses palpated. No hepatosplenomegaly. Bowel sounds positive.  Musculoskeletal: no clubbing / cyanosis. No joint deformity upper and lower extremities. Good ROM, no contractures. Normal muscle tone.  Skin: no rashes, lesions, ulcers. No induration Neurologic: CN 2-12 grossly intact. Sensation intact, DTR normal. Strength 5/5 in all 4.  Psychiatric: Normal judgment and insight. Alert and oriented x 3. Normal mood.     Labs on Admission: I have personally reviewed following labs and imaging studies  CBC: Recent Labs  Lab 03/14/20 1920  WBC 2.2*  NEUTROABS 2.0  HGB 11.1*  HCT 33.9*  MCV 81.9  PLT 242   Basic Metabolic Panel: Recent Labs  Lab 03/14/20 1920  NA 137   K 3.6  CL 102  CO2 24  GLUCOSE 126*  BUN 13  CREATININE 0.67  CALCIUM 8.2*   GFR: Estimated Creatinine Clearance: 99 mL/min (by C-G formula based on SCr of 0.67 mg/dL). Liver Function Tests: Recent Labs  Lab 03/14/20 1920  AST 18  ALT 16  ALKPHOS 61  BILITOT 0.2*  PROT 6.5  ALBUMIN 3.1*   Recent Labs  Lab 03/14/20 1920  LIPASE 22   No results for input(s): AMMONIA in the last 168 hours. Coagulation Profile: No results for input(s): INR, PROTIME in the last 168 hours. Cardiac Enzymes: No results for input(s): CKTOTAL, CKMB, CKMBINDEX, TROPONINI in the last 168 hours. BNP (last 3 results) No results for input(s): PROBNP in the last 8760 hours. HbA1C: No results for input(s): HGBA1C in the last 72 hours. CBG: No results for input(s): GLUCAP in the last 168 hours. Lipid Profile: No results for input(s): CHOL, HDL, LDLCALC, TRIG, CHOLHDL, LDLDIRECT in the last 72 hours. Thyroid Function Tests: No results for input(s): TSH, T4TOTAL, FREET4, T3FREE, THYROIDAB in the last 72 hours. Anemia Panel: No results for input(s): VITAMINB12, FOLATE, FERRITIN, TIBC, IRON, RETICCTPCT in the last 72 hours. Urine analysis:    Component Value Date/Time   COLORURINE YELLOW  03/06/2020 1537   APPEARANCEUR CLEAR 03/06/2020 1537   LABSPEC 1.030 03/06/2020 1537   PHURINE 5.0 03/06/2020 1537   GLUCOSEU NEGATIVE 03/06/2020 1537   HGBUR SMALL (A) 03/06/2020 1537   BILIRUBINUR SMALL (A) 03/06/2020 1537   KETONESUR 15 (A) 03/06/2020 1537   PROTEINUR NEGATIVE 03/06/2020 1537   UROBILINOGEN 0.2 09/26/2014 0830   NITRITE NEGATIVE 03/06/2020 1537   LEUKOCYTESUR NEGATIVE 03/06/2020 1537   Sepsis Labs: Recent Results (from the past 240 hour(s))  Wet prep, genital     Status: Abnormal   Collection Time: 03/06/20 11:43 PM  Result Value Ref Range Status   Yeast Wet Prep HPF POC NONE SEEN NONE SEEN Final   Trich, Wet Prep NONE SEEN NONE SEEN Final   Clue Cells Wet Prep HPF POC NONE SEEN NONE  SEEN Final   WBC, Wet Prep HPF POC MANY (A) NONE SEEN Final   Sperm NONE SEEN  Final    Comment: Performed at [facility], [address] [place], [place] [address]  SARS Coronavirus 2 by RT PCR (hospital order, performed in [facility]) Nasopharyngeal Peripheral     Status: Abnormal   Collection Time: 03/14/20  7:20 PM   Specimen: Peripheral; Nasopharyngeal  Result Value Ref Range Status   SARS Coronavirus 2 POSITIVE (A) NEGATIVE Final    Comment: RESULT CALLED TO, READ BACK BY AND VERIFIED WITH: NEAL KELLIE RN AT 2044 ON 03/14/20 BY I.SUGUT (NOTE) SARS-CoV-2 target nucleic acids are DETECTED  SARS-CoV-2 RNA is generally detectable in upper respiratory specimens  during the acute phase of infection.  Positive results are indicative  of the presence of the identified virus, but do not rule out bacterial infection or co-infection with other pathogens not detected by the test.  Clinical correlation with patient history and  other diagnostic information is necessary to determine patient infection status.  The expected result is negative.  Fact Sheet for Patients:   BoilerBrush.com.cyhttps://www.fda.gov/media/136312/download   Fact Sheet for Healthcare Providers:   https://pope.com/https://www.fda.gov/media/136313/download    This test is not yet approved or cleared by the [place] FDA and  has been authorized for detection and/or diagnosis of SARS-CoV-2 by FDA under an Emergency Use Authorization (EUA).  This EUA will remain in effect (meani ng this test can be used) for the duration of  the COVID-19 declaration under Section 564(b)(1) of the Act, 21 U.S.C. section 360-bbb-3(b)(1), unless the authorization is terminated or revoked sooner.  Performed at [facility] [place], [address] [place], [place] [address]      Radiological Exams on Admission: CT ABDOMEN PELVIS W CONTRAST  Result Date: 03/14/2020 CLINICAL DATA:  Right-sided abdominal pain EXAM: CT ABDOMEN AND PELVIS  WITH CONTRAST TECHNIQUE: Multidetector CT imaging of the abdomen and pelvis was performed using the standard protocol following bolus administration of intravenous contrast. CONTRAST:  100mL OMNIPAQUE IOHEXOL 300 MG/ML  SOLN COMPARISON:  03/06/2020 FINDINGS: Lower chest: Mild dependent atelectatic changes are noted bilaterally. Hepatobiliary: Liver and gallbladder are within normal limits. There is new free fluid surrounding the majority of the liver when compared with the prior exam. Pancreas: Unremarkable. No pancreatic ductal dilatation or surrounding inflammatory changes. Spleen: Normal in size without focal abnormality. Adrenals/Urinary Tract: Adrenal glands are within normal limits. The kidneys demonstrate a normal enhancement pattern bilaterally. No renal calculi are seen. No obstructive changes are noted. The bladder is decompressed. Stomach/Bowel: The stomach is within normal limits. Mild fluid dilatation of the small bowel is noted  without definitive obstructive changes. The colon is predominately decompressed. The appendix is well visualized and shows no inflammatory changes. Mild fluid is noted surrounding the small bowel loops consistent with enteritis. There are peripherally enhancing fluid collections identified deep within the pelvis. One of these is noted anterior to the rectum in the pelvic cul-de-sac measuring 5.8 x 3.3 cm best seen on image number 74 of series 2. These do not correspond to any bowel loops and are felt to represent developing abscesses. No definitive free air is seen. This previously represented some minimal free fluid within the pelvis. A second collection is noted just superior to the left measuring 4.5 cm best seen on image number 73 of series 2. Vascular/Lymphatic: No significant vascular findings are present. No enlarged abdominal or pelvic lymph nodes. Reproductive: Status post hysterectomy. No adnexal masses. Other: As described above there are 2 areas of peripherally  enhancing fluid collection deep within the pelvis consistent with developing abscesses. Some free fluid is also noted within the abdomen likely reactive in nature. Musculoskeletal: No acute or significant osseous findings. IMPRESSION: Peripherally enhancing fluid collections deep within the pelvis consistent with developing abscesses. Free fluid is also noted within the abdomen. These changes are new from the prior exam. Fluid-filled loops of small bowel with mild mucosal hyperemia consistent with enteritis and likely in part reactive to the changes in the pelvis. Normal-appearing appendix. Electronically Signed   By: Alcide CleverMark  Lukens M.D.   On: 03/14/2020 21:32   DG Chest Portable 1 View  Result Date: 03/14/2020 CLINICAL DATA:  COVID positive.  Fever and abdominal pain EXAM: PORTABLE CHEST 1 VIEW COMPARISON:  09/21/2015 FINDINGS: Heart size and pulmonary vascularity are normal. Lungs are clear. No airspace disease or consolidation. No pleural effusions. No pneumothorax. Mediastinal contours appear intact. IMPRESSION: No active disease. Electronically Signed   By: Burman NievesWilliam  Stevens M.D.   On: 03/14/2020 21:22    EKG: Independently reviewed.  Sinus tachycardia 130 bpm with rightward axis  Assessment/Plan Sepsis 2/2 Pelvic abscess in female and enteritis: Acute.  Patient presented with complaints of abdominal pain and chills.  On admission patient was found to be febrile up to 103.7 F with tachycardia, tachypnea, and initial.  Labs significant for WBC 2.2, but lactic acid reassuring at 1.6.  CT imaging significant for peripheral enhancing fluid collections in the pelvis just above developing abscesses and enteritis.  Suspect this is likely related with patient's recent surgery including hysterectomy, hernia repair, bilateral salpingo-oophorectomy, and lysis of adhesions.  Patient received full fluid bolus with vancomycin and Zosyn. -Admit to a progressive bed -Follow-up blood and urine cultures -N.p.o. for  needed procedure -Continue empiric antibiotics of vancomycin and Zosyn -IV fentanyl as needed pain -Normal saline IV fluids at 100 mL/h -Tylenol as needed for fever -Appreciate IR consultative services.  Plan is for patient to undergo IR drainage in a.m. on 1/23 -Appreciate General surgery consulted, for any further recommendations  Transient hypotension: Acute.  Patient's blood pressures were initially as low as 81/45.  Home blood pressure medications include lisinopril 2.5 mg daily.  Patient denies any history of heart disease -Hold lisinopril -Bolus IV fluids as needed  COVID-19 infection: Chest x-ray showed no acute abnormalities.  COVID-19 screening was positive, but had been initially diagnosed on 1/8 at  health department.  Currently O2 saturation maintained on room air. -Airborne and contact precautions  -Continuous pulse oximetry with nasal cannula oxygen as needed for O2 sats less than 90% -Albuterol inhaler as needed shortness of  breath -Antitussives as needed  Normocytic anemia: Acute. hemoglobin 11.1 g/dL, but previously had been around 12.3 on 01/31/2020.  Patient had reported some vaginal bleeding.  She had also been given naproxen for enteritis. -Type and screen for possible new blood -Continue to monitor H&H -Transfuse blood products if needed  Diabetes mellitus type 2: Patient initial blood glucose within normal limits.  Home medications include metformin 500 mg twice daily. -Hypoglycemic protocol -Hold metformin -CBGs before every meal with very sensitive SSI  Hyperlipidemia: Home medications include Lipitor 40 mg nightly -Continue atorvastatin  GI prophylaxis-Pepcid DVT prophylaxis: lovenox, but woll discontinue if any concern for bleeding Code Status: Full  Family Communication: Husband updated over the phone Disposition Plan: Likely discharge home once medically stable Consults called: IR, General Admission status: Inpatient, require more than 2 midnight stay  due to sepsis requiring IV antibiotic  Clydie Braun MD [facility]   If 7PM-7AM, please contact night-coverage   03/15/2020, 7:50 AM

## 2020-03-15 NOTE — Consult Note (Signed)
Chief Complaint: Patient was seen in consultation today for pelvic abscess drain placement Chief Complaint  Patient presents with  . Abdominal Pain   at the request of Dr D Louk   Supervising Physician: Irish LackYamagata, Glenn  [facility]  History of Present Illness: Katherine Cabrera is a 45 y.o. female   Status post robotic assisted hysterectomy with removal of tubes and umbilical hernia repair per Dr. Molli Poseye para  Done 12/25/2019  Recent onset abd pain and fever +Covid 03/01/20  CT 03/14/20:  IMPRESSION: Peripherally enhancing fluid collections deep within the pelvis consistent with developing abscesses. Free fluid is also noted within the abdomen. These changes are new from the prior exam. Fluid-filled loops of small bowel with mild mucosal hyperemia consistent with enteritis and likely in part reactive to the changes in the pelvis.  Transfer to [facility] just now Scheduled now for pelvic abscess drain placement in IR Procedure approved with Dr Fredia SorrowYamagata Planned for 03/16/20    Past Medical History:  Diagnosis Date  . De Quervain's disease (radial styloid tenosynovitis)     Past Surgical History:  Procedure Laterality Date  . ABDOMINAL HYSTERECTOMY    . CESAREAN SECTION    . HERNIA REPAIR      Allergies: Patient has no known allergies.  Medications: Prior to Admission medications   Medication Sig Start Date End Date Taking? Authorizing Provider  atorvastatin (LIPITOR) 40 MG tablet Take 40 mg by mouth at bedtime. 02/12/20  Yes [provider]  Continuous Blood Gluc Sensor (FREESTYLE LIBRE 14 DAY SENSOR) MISC Apply 1 each topically every 14 (fourteen) days. 02/09/20  Yes [provider]  dicyclomine (BENTYL) 20 MG tablet Take 1 tablet (20 mg total) by mouth 2 (two) times daily. 03/07/20  Yes Khatri, Hina, PA-C  lisinopril (ZESTRIL) 2.5 MG tablet Take 2.5 mg by mouth daily. 02/09/20  Yes [provider]  metFORMIN (GLUCOPHAGE-XR)  500 MG 24 hr tablet Take 1,000 mg by mouth 2 (two) times daily. 12/17/19  Yes [provider]  Multiple Vitamin (MULTI-VITAMIN) tablet Take 1 tablet by mouth daily.   Yes [provider]  naproxen (NAPROSYN) 500 MG tablet Take 1 tablet (500 mg total) by mouth 2 (two) times daily. 03/07/20  Yes Khatri, Hina, PA-C     History reviewed. No pertinent family history.  Social History   Socioeconomic History  . Marital status: Married    Spouse name: Not on file  . Number of children: Not on file  . Years of education: Not on file  . Highest education level: Not on file  Occupational History  . Not on file  Tobacco Use  . Smoking status: Never Smoker  . Smokeless tobacco: Never Used  Substance and Sexual Activity  . Alcohol use: No  . Drug use: No  . Sexual activity: Not on file  Other Topics Concern  . Not on file  Social History Narrative  . Not on file   Social Determinants of Health   Financial Resource Strain: Not on file  Food Insecurity: Not on file  Transportation Needs: Not on file  Physical Activity: Not on file  Stress: Not on file  Social Connections: Not on file    Review of Systems: A 12 point ROS discussed and pertinent positives are indicated in the HPI above.  All other systems are negative.  Review of Systems  Constitutional: Positive for activity change and fever.  Respiratory: Negative for shortness of breath.   Cardiovascular: Negative for  chest pain.  Gastrointestinal: Positive for abdominal pain and nausea.  Psychiatric/Behavioral: Negative for behavioral problems and confusion.    Vital Signs: BP 105/61 (BP Location: Left Arm)   Pulse (!) 125   Temp 99.6 F (37.6 C)   Resp 19   Ht 5\' 3"  (1.6 m)   Wt 212 lb (96.2 kg)   LMP 05/07/2019   SpO2 100%   BMI 37.55 kg/m   Physical Exam Vitals reviewed.  Cardiovascular:     Rate and Rhythm: Normal rate.  Pulmonary:     Breath sounds: Normal breath sounds.  Abdominal:      Palpations: Abdomen is soft.     Tenderness: There is abdominal tenderness.  Skin:    General: Skin is warm.  Neurological:     Mental Status: She is alert and oriented to person, place, and time.  Psychiatric:        Behavior: Behavior normal.     Imaging: CT ABDOMEN PELVIS W CONTRAST  Result Date: 03/14/2020 CLINICAL DATA:  Right-sided abdominal pain EXAM: CT ABDOMEN AND PELVIS WITH CONTRAST TECHNIQUE: Multidetector CT imaging of the abdomen and pelvis was performed using the standard protocol following bolus administration of intravenous contrast. CONTRAST:  03/16/2020 OMNIPAQUE IOHEXOL 300 MG/ML  SOLN COMPARISON:  03/06/2020 FINDINGS: Lower chest: Mild dependent atelectatic changes are noted bilaterally. Hepatobiliary: Liver and gallbladder are within normal limits. There is new free fluid surrounding the majority of the liver when compared with the prior exam. Pancreas: Unremarkable. No pancreatic ductal dilatation or surrounding inflammatory changes. Spleen: Normal in size without focal abnormality. Adrenals/Urinary Tract: Adrenal glands are within normal limits. The kidneys demonstrate a normal enhancement pattern bilaterally. No renal calculi are seen. No obstructive changes are noted. The bladder is decompressed. Stomach/Bowel: The stomach is within normal limits. Mild fluid dilatation of the small bowel is noted without definitive obstructive changes. The colon is predominately decompressed. The appendix is well visualized and shows no inflammatory changes. Mild fluid is noted surrounding the small bowel loops consistent with enteritis. There are peripherally enhancing fluid collections identified deep within the pelvis. One of these is noted anterior to the rectum in the pelvic cul-de-sac measuring 5.8 x 3.3 cm best seen on image number 74 of series 2. These do not correspond to any bowel loops and are felt to represent developing abscesses. No definitive free air is seen. This previously  represented some minimal free fluid within the pelvis. A second collection is noted just superior to the left measuring 4.5 cm best seen on image number 73 of series 2. Vascular/Lymphatic: No significant vascular findings are present. No enlarged abdominal or pelvic lymph nodes. Reproductive: Status post hysterectomy. No adnexal masses. Other: As described above there are 2 areas of peripherally enhancing fluid collection deep within the pelvis consistent with developing abscesses. Some free fluid is also noted within the abdomen likely reactive in nature. Musculoskeletal: No acute or significant osseous findings. IMPRESSION: Peripherally enhancing fluid collections deep within the pelvis consistent with developing abscesses. Free fluid is also noted within the abdomen. These changes are new from the prior exam. Fluid-filled loops of small bowel with mild mucosal hyperemia consistent with enteritis and likely in part reactive to the changes in the pelvis. Normal-appearing appendix. Electronically Signed   By: 03/08/2020 M.D.   On: 03/14/2020 21:32   CT ABDOMEN PELVIS W CONTRAST  Result Date: 03/06/2020 CLINICAL DATA:  Right lower quadrant abdominal pain. EXAM: CT ABDOMEN AND PELVIS WITH CONTRAST TECHNIQUE: Multidetector CT imaging  of the abdomen and pelvis was performed using the standard protocol following bolus administration of intravenous contrast. CONTRAST:  OMNIPAQUE IOHEXOL 300 MG/ML  SOLN COMPARISON:  None. FINDINGS: Lower chest: Choose bilateral pleural effusions and basilar atelectasis. Heart is normal in size. Hepatobiliary: No focal liver abnormality is seen. No gallstones, gallbladder wall thickening, or biliary dilatation. Pancreas: No ductal dilatation or inflammation. Spleen: Normal in size without focal abnormality. Adrenals/Urinary Tract: Normal adrenal glands. No hydronephrosis or perinephric edema. Homogeneous renal enhancement with symmetric excretion on delayed phase imaging.  Urinary bladder is partially distended without wall thickening. Stomach/Bowel: Bowel evaluation is limited in the absence of enteric contrast. Stomach is decompressed. Normal positioning of the duodenum and ligament of Treitz. There are scattered fluid-filled small bowel loops with occasional areas of wall thickening. No bowel dilatation to suggest obstruction. Appendix is tentatively visualized and normal, series 2, image 64. No evidence of appendicitis. Liquid stool in the cecum and ascending colon. Remainder of the colon is decompressed. There is interposition of the splenic flexure posterior to the spleen under the hemidiaphragm. Scattered diverticula noted at the splenic flexure of the colon. No diverticulitis or acute colonic inflammation. Vascular/Lymphatic: The abdominal aorta is normal in caliber. Portal vein is patent. No portal venous or mesenteric gas. Multiple small retroperitoneal mesenteric nodes are not enlarged by size criteria. There is a small epitrochlear node measuring 8 mm. Reproductive: Hysterectomy with soft tissue prominence in the region of the cervix. Ovary is not definitively visualized on CT. Other: Small amount of scattered free fluid primarily in the pelvis. Mild edema of the mesentery and anterior omental fat. No free air or focal fluid collection. Musculoskeletal: There are no acute or suspicious osseous abnormalities. IMPRESSION: 1. No evidence of appendicitis. 2. Scattered fluid-filled small bowel loops with occasional areas of wall thickening, suggesting enteritis. No obstruction. 3. Small amount of scattered free fluid in the pelvis, likely reactive. Mild edema of the mesentery and anterior omental fat, typically reactive. 4. Trace bilateral pleural effusions and basilar atelectasis. Electronically Signed   By: Narda Rutherford M.D.   On: 03/06/2020 22:50   DG Chest Portable 1 View  Result Date: 03/14/2020 CLINICAL DATA:  COVID positive.  Fever and abdominal pain EXAM:  PORTABLE CHEST 1 VIEW COMPARISON:  09/21/2015 FINDINGS: Heart size and pulmonary vascularity are normal. Lungs are clear. No airspace disease or consolidation. No pleural effusions. No pneumothorax. Mediastinal contours appear intact. IMPRESSION: No active disease. Electronically Signed   By: Burman Nieves M.D.   On: 03/14/2020 21:22    Labs:  CBC: Recent Labs    03/06/20 1537 03/14/20 1920  WBC 6.8 2.2*  HGB 12.7 11.1*  HCT 39.1 33.9*  PLT 260 242    COAGS: No results for input(s): INR, APTT in the last 8760 hours.  BMP: Recent Labs    03/06/20 1537 03/14/20 1920  NA 137 137  K 3.6 3.6  CL 103 102  CO2 25 24  GLUCOSE 147* 126*  BUN 17 13  CALCIUM 8.7* 8.2*  CREATININE 0.75 0.67  GFRNONAA >60 >60    LIVER FUNCTION TESTS: Recent Labs    03/06/20 1537 03/14/20 1920  BILITOT 0.4 0.2*  AST 14* 18  ALT 17 16  ALKPHOS 54 61  PROT 6.6 6.5  ALBUMIN 3.5 3.1*    TUMOR MARKERS: No results for input(s): AFPTM, CEA, CA199, CHROMGRNA in the last 8760 hours.  Assessment and Plan:  S/P Hysterectomy 12/25/19 abd pain and fever few days Imaging revealing  pelvic abscesses Scheduled for pelvic abscess drain placement in IR 1/23 Risks and benefits discussed with the patient including bleeding, infection, damage to adjacent structures, bowel perforation/fistula connection, and sepsis.  All of the patient's questions were answered, patient is agreeable to proceed. Consent signed and in chart.   Thank you for this interesting consult.  I greatly enjoyed meeting Katherine Cabrera and look forward to participating in their care.  A copy of this report was sent to the requesting provider on this date.  Electronically Signed: Robet Leu, PA-C 03/15/2020, 12:54 PM   I spent a total of 20 Minutes    in face to face in clinical consultation, greater than 50% of which was counseling/coordinating care for pelvic abscess drain

## 2020-03-15 NOTE — Progress Notes (Signed)
Pharmacy Antibiotic Note  Katherine Cabrera is a 45 y.o. female admitted on 03/14/2020 with sepsis with concern for intra-abominal infection.  Pharmacy has been consulted for vancomycin and Zosyn dosing.  Plan: Vancomycin 2000mg  x1 then 1000mg  IV Q12H. Goal AUC 400-550.  Expected AUC 530.  SCr used 0.67.  Zosyn 3.375g IV Q8H (4-hour infusion).  Height: 5\' 3"  (160 cm) Weight: 96.2 kg (212 lb) IBW/kg (Calculated) : 52.4  Temp (24hrs), Avg:99.3 F (37.4 C), Min:98.3 F (36.8 C), Max:103.6 F (39.8 C)  Recent Labs  Lab 03/14/20 1920 03/14/20 2207  WBC 2.2*  --   CREATININE 0.67  --   LATICACIDVEN  --  1.6    Estimated Creatinine Clearance: 99 mL/min (by C-G formula based on SCr of 0.67 mg/dL).    No Known Allergies   Thank you for allowing pharmacy to be a part of this patient's care.  03/16/20, PharmD, BCPS  03/15/2020 7:23 AM

## 2020-03-15 NOTE — ED Notes (Signed)
She is taken from our facility by [facility] at this time without incident. Pain med. Given by [facility] prior to d/c.

## 2020-03-15 NOTE — ED Provider Notes (Signed)
  Physical Exam  BP 113/64   Pulse (!) 114   Temp 98.4 F (36.9 C) (Oral)   Resp (!) 26   Ht 5\' 3"  (1.6 m)   Wt 96.2 kg   LMP 05/07/2019   SpO2 97%   BMI 37.55 kg/m   Physical Exam  ED Course/Procedures     Procedures  MDM  45yo female with pelvic abscesses seen last night, awaiting bed and now transportatoin. Had hypotension that improved, persistent mild tachycardia. Previous provider had spoke with her outside facility OBGYN and General Surgery.  Reordered  Vancomycin and zosyn.  Discussed with IR with plan for IR drainage once she arrives at hospital.  Order placed for IR guided drainage.       05/09/2019, MD 03/15/20 [phone]

## 2020-03-15 NOTE — ED Notes (Signed)
She is drowsy and resting. She fields a phone call from her mother just now. She is oriented x 4.

## 2020-03-15 NOTE — ED Notes (Signed)
I have just phoned report to Samara Deist, RN at [facility]. I have also given phone report to Salley, RN with [facility]. Pt. Remains in no distress and is resting.

## 2020-03-15 NOTE — Consult Note (Signed)
Surgical Evaluation Requesting provider: Dr. Katrinka Blazing  Chief Complaint: Abdominal pain  HPI: 45 year old woman who [facility] evening with worsening abdominal pain.  She was evaluated for this in the emergency room on January 13 and underwent a CT scan showing diffusely inflamed bowel with a small amount of free fluid, she was treated with bowel regimen and Bentyl and said that she felt better after that and was able to return to work.  However, her symptoms returned about 2 days ago and worsened yesterday to a point that was more severe than she had previously experienced.  She notes associated with chills and nausea but no emesis.  She did have a fever of 103.6 on initial evaluation at [facility].  Reports that she is not having diarrhea, was able to eat normally yesterday and had a normal bowel movement yesterday. She reports a positive COVID test on January 8 and has not had significant respiratory symptoms from this. On December 25, 2019 she underwent a robotic hysterectomy and bilateral salpingectomy with concomitant umbilical hernia repair by Drs. Louk and Teppara respectively in [place].  The hernia repair was done with a V-Loc fascial closure and phasix mesh given the contaminated nature of the case.  She works in [employer] at [employer].  No Known Allergies  Past Medical History:  Diagnosis Date  . De Quervain's disease (radial styloid tenosynovitis)     Past Surgical History:  Procedure Laterality Date  . ABDOMINAL HYSTERECTOMY    . CESAREAN SECTION    . HERNIA REPAIR      History reviewed. No pertinent family history.  Social History   Socioeconomic History  . Marital status: Married    Spouse name: Not on file  . Number of children: Not on file  . Years of education: Not on file  . Highest education level: Not on file  Occupational History  . Not on file  Tobacco Use  . Smoking status: Never Smoker  . Smokeless tobacco:  Never Used  Substance and Sexual Activity  . Alcohol use: No  . Drug use: No  . Sexual activity: Not on file  Other Topics Concern  . Not on file  Social History Narrative  . Not on file   Social Determinants of Health   Financial Resource Strain: Not on file  Food Insecurity: Not on file  Transportation Needs: Not on file  Physical Activity: Not on file  Stress: Not on file  Social Connections: Not on file    No current facility-administered medications on file prior to encounter.   Current Outpatient Medications on File Prior to Encounter  Medication Sig Dispense Refill  . atorvastatin (LIPITOR) 40 MG tablet Take 40 mg by mouth at bedtime.    . Continuous Blood Gluc Sensor (FREESTYLE LIBRE 14 DAY SENSOR) MISC Apply 1 each topically every 14 (fourteen) days.    Marland Kitchen dicyclomine (BENTYL) 20 MG tablet Take 1 tablet (20 mg total) by mouth 2 (two) times daily. 20 tablet 0  . lisinopril (ZESTRIL) 2.5 MG tablet Take 2.5 mg by mouth daily.    . metFORMIN (GLUCOPHAGE-XR) 500 MG 24 hr tablet Take 1,000 mg by mouth 2 (two) times daily.    . Multiple Vitamin (MULTI-VITAMIN) tablet Take 1 tablet by mouth daily.    . naproxen (NAPROSYN) 500 MG tablet Take 1 tablet (500 mg total) by mouth 2 (two) times daily. 30 tablet 0    Review of Systems: a complete, 10pt  review of systems was completed with pertinent positives and negatives as documented in the HPI  Physical Exam: Vitals:   03/15/20 1000 03/15/20 1141  BP: 109/62 105/61  Pulse: (!) 107 (!) 125  Resp: (!) 22 19  Temp:  99.6 F (37.6 C)  SpO2: 97% 100%   Gen: A&Ox3, no distress  Eyes: lids and conjunctivae normal, no icterus. Pupils equally round and reactive to light.  Neck: supple without mass or thyromegaly Chest: respiratory effort is normal. No crepitus or tenderness on palpation of the chest. Breath sounds equal.  Cardiovascular: RRR with palpable distal pulses, no pedal edema Gastrointestinal: soft, mildly distended,  diffusely tender with involuntary guarding Lymphatic: no lymphadenopathy in the neck or groin Muscoloskeletal: no clubbing or cyanosis of the fingers.  Strength is symmetrical throughout.  Range of motion of bilateral upper and lower extremities normal without pain, crepitation or contracture. Neuro: cranial nerves grossly intact.  Sensation intact to light touch diffusely. Psych: appropriate mood and affect, normal insight/judgment intact  Skin: warm and dry   CBC Latest Ref Rng & Units 03/14/2020 03/06/2020 09/26/2014  WBC 4.0 - 10.5 K/uL 2.2(L) 6.8 8.5  Hemoglobin 12.0 - 15.0 g/dL 11.1(L) 12.7 11.5(L)  Hematocrit 36.0 - 46.0 % 33.9(L) 39.1 34.6(L)  Platelets 150 - 400 K/uL 242 260 165    CMP Latest Ref Rng & Units 03/14/2020 03/06/2020 09/26/2014  Glucose 70 - 99 mg/dL 299(B) 716(R) 678(L)  BUN 6 - 20 mg/dL 13 17 38(B)  Creatinine 0.44 - 1.00 mg/dL 0.17 5.10 2.58  Sodium 135 - 145 mmol/L 137 137 137  Potassium 3.5 - 5.1 mmol/L 3.6 3.6 3.6  Chloride 98 - 111 mmol/L 102 103 105  CO2 22 - 32 mmol/L 24 25 24   Calcium 8.9 - 10.3 mg/dL 8.2(L) 8.7(L) 8.5(L)  Total Protein 6.5 - 8.1 g/dL 6.5 6.6 7.1  Total Bilirubin 0.3 - 1.2 mg/dL ) 0.4 0.5  Alkaline Phos 38 - 126 U/L 61 54 60  AST 15 - 41 U/L 18 14(L) 19  ALT 0 - 44 U/L 16 17 12(L)    Lab Results  Component Value Date   INR 1.00 08/10/2014    Imaging: CT ABDOMEN PELVIS W CONTRAST  Result Date: 03/14/2020 CLINICAL DATA:  Right-sided abdominal pain EXAM: CT ABDOMEN AND PELVIS WITH CONTRAST TECHNIQUE: Multidetector CT imaging of the abdomen and pelvis was performed using the standard protocol following bolus administration of intravenous contrast. CONTRAST:  03/16/2020 OMNIPAQUE IOHEXOL 300 MG/ML  SOLN COMPARISON:  03/06/2020 FINDINGS: Lower chest: Mild dependent atelectatic changes are noted bilaterally. Hepatobiliary: Liver and gallbladder are within normal limits. There is new free fluid surrounding the majority of the liver when compared  with the prior exam. Pancreas: Unremarkable. No pancreatic ductal dilatation or surrounding inflammatory changes. Spleen: Normal in size without focal abnormality. Adrenals/Urinary Tract: Adrenal glands are within normal limits. The kidneys demonstrate a normal enhancement pattern bilaterally. No renal calculi are seen. No obstructive changes are noted. The bladder is decompressed. Stomach/Bowel: The stomach is within normal limits. Mild fluid dilatation of the small bowel is noted without definitive obstructive changes. The colon is predominately decompressed. The appendix is well visualized and shows no inflammatory changes. Mild fluid is noted surrounding the small bowel loops consistent with enteritis. There are peripherally enhancing fluid collections identified deep within the pelvis. One of these is noted anterior to the rectum in the pelvic cul-de-sac measuring 5.8 x 3.3 cm best seen on image number 74 of series 2. These do  not correspond to any bowel loops and are felt to represent developing abscesses. No definitive free air is seen. This previously represented some minimal free fluid within the pelvis. A second collection is noted just superior to the left measuring 4.5 cm best seen on image number 73 of series 2. Vascular/Lymphatic: No significant vascular findings are present. No enlarged abdominal or pelvic lymph nodes. Reproductive: Status post hysterectomy. No adnexal masses. Other: As described above there are 2 areas of peripherally enhancing fluid collection deep within the pelvis consistent with developing abscesses. Some free fluid is also noted within the abdomen likely reactive in nature. Musculoskeletal: No acute or significant osseous findings. IMPRESSION: Peripherally enhancing fluid collections deep within the pelvis consistent with developing abscesses. Free fluid is also noted within the abdomen. These changes are new from the prior exam. Fluid-filled loops of small bowel with mild  mucosal hyperemia consistent with enteritis and likely in part reactive to the changes in the pelvis. Normal-appearing appendix. Electronically Signed   By: Alcide Clever M.D.   On: 03/14/2020 21:32   DG Chest Portable 1 View  Result Date: 03/14/2020 CLINICAL DATA:  COVID positive.  Fever and abdominal pain EXAM: PORTABLE CHEST 1 VIEW COMPARISON:  09/21/2015 FINDINGS: Heart size and pulmonary vascularity are normal. Lungs are clear. No airspace disease or consolidation. No pleural effusions. No pneumothorax. Mediastinal contours appear intact. IMPRESSION: No active disease. Electronically Signed   By: Burman Nieves M.D.   On: 03/14/2020 21:22     A/P: 45 year old woman who is about 3 months status post uncomplicated robotic hysterectomy and bilateral salpingectomy and umbilical hernia repair with biologic mesh, and 2-week status post a positive COVID test with worsening abdominal pain and concordantly worsening appearance on CT scan of free fluid with developing pelvic abscesses and what appears to be diffuse small bowel enteritis versus reactive changes.  Etiology unclear.  Lactate, bicarb and creatinine are normal, her white blood cell count is notably low at 2.2 from 6.8 last week.  At this point recommend keeping her n.p.o., gentle fluid resuscitation, empiric broad-spectrum antibiotics and IR drainage of what is accessible.  Surgery will continue to follow.    Patient Active Problem List   Diagnosis Date Noted  . Pelvic abscess in female 03/15/2020  . Transient hypotension 03/15/2020  . Enteritis 03/15/2020  . COVID-19 virus infection 03/15/2020  . Normocytic anemia 03/15/2020  . Type 2 diabetes mellitus with hyperlipidemia (HCC) 03/15/2020  . Sepsis (HCC) 03/14/2020       Phylliss Blakes, MD [facility], [place]  See AMION to contact appropriate on-call provider

## 2020-03-16 ENCOUNTER — Inpatient Hospital Stay (HOSPITAL_COMMUNITY): Payer: BC Managed Care – PPO

## 2020-03-16 LAB — BASIC METABOLIC PANEL
Anion gap: 10 (ref 5–15)
BUN: 20 mg/dL (ref 6–20)
CO2: 21 mmol/L — ABNORMAL LOW (ref 22–32)
Calcium: 7.4 mg/dL — ABNORMAL LOW (ref 8.9–10.3)
Chloride: 104 mmol/L (ref 98–111)
Creatinine, Ser: 2 mg/dL — ABNORMAL HIGH (ref 0.44–1.00)
GFR, Estimated: 31 mL/min — ABNORMAL LOW (ref >60)
Glucose, Bld: 127 mg/dL — ABNORMAL HIGH (ref 70–99)
Potassium: 4.1 mmol/L (ref 3.5–5.1)
Sodium: 135 mmol/L (ref 135–145)

## 2020-03-16 LAB — CBC
HCT: 32 % — ABNORMAL LOW (ref 36.0–46.0)
Hemoglobin: 10.3 g/dL — ABNORMAL LOW (ref 12.0–15.0)
MCH: 26.3 pg (ref 26.0–34.0)
MCHC: 32.2 g/dL (ref 30.0–36.0)
MCV: 81.6 fL (ref 80.0–100.0)
Platelets: 187 10*3/uL (ref 150–400)
RBC: 3.92 MIL/uL (ref 3.87–5.11)
RDW: 14.2 % (ref 11.5–15.5)
WBC: 9.2 10*3/uL (ref 4.0–10.5)
nRBC: 0 % (ref 0.0–0.2)

## 2020-03-16 LAB — PROTIME-INR
INR: 1.7 — ABNORMAL HIGH (ref 0.8–1.2)
Prothrombin Time: 19.4 seconds — ABNORMAL HIGH (ref 11.4–15.2)

## 2020-03-16 LAB — HEMOGLOBIN A1C
Hgb A1c MFr Bld: 6.2 % — ABNORMAL HIGH (ref 4.8–5.6)
Mean Plasma Glucose: 131.24 mg/dL

## 2020-03-16 LAB — GLUCOSE, CAPILLARY
Glucose-Capillary: 101 mg/dL — ABNORMAL HIGH (ref 70–99)
Glucose-Capillary: 88 mg/dL (ref 70–99)
Glucose-Capillary: 99 mg/dL (ref 70–99)
Glucose-Capillary: 100 mg/dL — ABNORMAL HIGH (ref 70–99)

## 2020-03-16 MED ORDER — FENTANYL CITRATE (PF) 100 MCG/2ML IJ SOLN
INTRAMUSCULAR | Status: AC | PRN
  Administered 2020-03-16: 50 ug via INTRAVENOUS

## 2020-03-16 MED ORDER — LIDOCAINE HCL 1 % IJ SOLN
INTRAMUSCULAR | Status: AC
  Filled 2020-03-16: qty 20

## 2020-03-16 MED ORDER — SODIUM CHLORIDE 0.9 % IV BOLUS
250.0000 mL | Freq: Once | INTRAVENOUS | Status: AC
  Administered 2020-03-16: 250 mL via INTRAVENOUS

## 2020-03-16 MED ORDER — FAMOTIDINE IN NACL 20-0.9 MG/50ML-% IV SOLN
20.0000 mg | INTRAVENOUS | Status: DC
  Administered 2020-03-16 – 2020-03-22 (×6): 20 mg via INTRAVENOUS
  Filled 2020-03-16 (×6): qty 50

## 2020-03-16 MED ORDER — LACTATED RINGERS IV BOLUS
500.0000 mL | Freq: Once | INTRAVENOUS | Status: AC
  Administered 2020-03-16: 500 mL via INTRAVENOUS

## 2020-03-16 MED ORDER — VANCOMYCIN HCL IN DEXTROSE 1-5 GM/200ML-% IV SOLN
1000.0000 mg | INTRAVENOUS | Status: DC
  Administered 2020-03-16: 1000 mg via INTRAVENOUS
  Filled 2020-03-16: qty 200

## 2020-03-16 MED ORDER — MIDAZOLAM HCL 2 MG/2ML IJ SOLN
INTRAMUSCULAR | Status: AC
  Filled 2020-03-16: qty 6

## 2020-03-16 MED ORDER — FENTANYL CITRATE (PF) 100 MCG/2ML IJ SOLN
INTRAMUSCULAR | Status: AC
  Filled 2020-03-16: qty 4

## 2020-03-16 MED ORDER — DEXTROSE 50 % IV SOLN
INTRAVENOUS | Status: AC
  Filled 2020-03-16: qty 50

## 2020-03-16 MED ORDER — MIDODRINE HCL 5 MG PO TABS
5.0000 mg | ORAL_TABLET | Freq: Three times a day (TID) | ORAL | Status: DC
  Administered 2020-03-18: 5 mg via ORAL
  Filled 2020-03-16 (×3): qty 1

## 2020-03-16 NOTE — Plan of Care (Signed)
  Problem: Education: Goal: Knowledge of General Education information will improve Description: Including pain rating scale, medication(s)/side effects and non-pharmacologic comfort measures 03/16/2020 0403 by Zara Chess, RN Outcome: Progressing 03/16/2020 0347 by Zara Chess, RN Outcome: Progressing   Problem: Health Behavior/Discharge Planning: Goal: Ability to manage health-related needs will improve 03/16/2020 0403 by Zara Chess, RN Outcome: Progressing 03/16/2020 0347 by Zara Chess, RN Outcome: Progressing   Problem: Clinical Measurements: Goal: Ability to maintain clinical measurements within normal limits will improve 03/16/2020 0403 by Zara Chess, RN Outcome: Progressing 03/16/2020 0347 by Zara Chess, RN Outcome: Progressing Goal: Will remain free from infection 03/16/2020 0403 by Zara Chess, RN Outcome: Progressing 03/16/2020 0347 by Zara Chess, RN Outcome: Progressing Goal: Diagnostic test results will improve 03/16/2020 0403 by Zara Chess, RN Outcome: Progressing 03/16/2020 0347 by Zara Chess, RN Outcome: Progressing Goal: Respiratory complications will improve 03/16/2020 0403 by Zara Chess, RN Outcome: Progressing 03/16/2020 0347 by Zara Chess, RN Outcome: Progressing Goal: Cardiovascular complication will be avoided 03/16/2020 0403 by Zara Chess, RN Outcome: Progressing 03/16/2020 0347 by Zara Chess, RN Outcome: Progressing   Problem: Activity: Goal: Risk for activity intolerance will decrease 03/16/2020 0403 by Zara Chess, RN Outcome: Progressing 03/16/2020 0347 by Zara Chess, RN Outcome: Progressing   Problem: Nutrition: Goal: Adequate nutrition will be maintained 03/16/2020 0403 by Zara Chess, RN Outcome: Progressing 03/16/2020 0347 by Zara Chess, RN Outcome: Progressing   Problem:  Coping: Goal: Level of anxiety will decrease 03/16/2020 0403 by Zara Chess, RN Outcome: Progressing 03/16/2020 0347 by Zara Chess, RN Outcome: Progressing   Problem: Elimination: Goal: Will not experience complications related to bowel motility 03/16/2020 0403 by Zara Chess, RN Outcome: Progressing 03/16/2020 0347 by Zara Chess, RN Outcome: Progressing Goal: Will not experience complications related to urinary retention 03/16/2020 0403 by Zara Chess, RN Outcome: Progressing 03/16/2020 0347 by Zara Chess, RN Outcome: Progressing   Problem: Pain Managment: Goal: General experience of comfort will improve 03/16/2020 0403 by Zara Chess, RN Outcome: Progressing 03/16/2020 0347 by Zara Chess, RN Outcome: Progressing   Problem: Safety: Goal: Ability to remain free from injury will improve 03/16/2020 0403 by Zara Chess, RN Outcome: Progressing 03/16/2020 0347 by Zara Chess, RN Outcome: Progressing   Problem: Skin Integrity: Goal: Risk for impaired skin integrity will decrease 03/16/2020 0403 by Zara Chess, RN Outcome: Progressing 03/16/2020 0347 by Zara Chess, RN Outcome: Progressing

## 2020-03-16 NOTE — Plan of Care (Signed)

## 2020-03-16 NOTE — Progress Notes (Signed)
[facility] Progress Note     Subjective: CC-  Feels the same as yesterday, no better no worse. Continues to have constant, diffuse abdominal pain. Pain is mostly central/lower abdomen. Passing flatus, last BM 2 days ago which was normal and not diarrhea. Denies any n/v.  BP low earlier this morning. Given 250cc fluid bolus earlier this morning for tachycardia/hypotension. Denies lightheadedness or dizziness. Cr up to 2. Urine is dark. TMAX 99.6. WBC 9.2 from 2.2  Objective: Vital signs in last 24 hours: Temp:  [97.7 F (36.5 C)-99.6 F (37.6 C)] 97.7 F (36.5 C) (01/23 0048) Pulse Rate:  [107-125] 121 (01/23 0048) Resp:  [19-22] 19 (01/23 0413) BP: (96-109)/(51-62) 96/51 (01/23 0048) SpO2:  [96 %-100 %] 96 % (01/23 0048) Last BM Date: 03/14/20  Intake/Output from previous day: 01/22 0701 - 01/23 0700 In: 1660 [P.O.:60; I.V.:1000; IV Piggyback:600] Out: -  Intake/Output this shift: No intake/output data recorded.  PE: Gen:  Alert, NAD, pleasant HEENT: EOM's intact, pupils equal and round Card:  Mild tachy low 100s Pulm:  CTAB, no W/R/R, rate and effort normal Abd: Soft, mild distension, diffuse tenderness with voluntary guarding, no rebound Skin: no rashes noted, warm and dry  Lab Results:  Recent Labs    03/14/20 1920 03/15/20 1443 03/16/20 0217  WBC 2.2*  --  9.2  HGB 11.1* 11.6* 10.3*  HCT 33.9* 36.6 32.0*  PLT 242  --  187   BMET Recent Labs    03/14/20 1920 03/16/20 0217  NA 137 135  K 3.6 4.1  CL 102 104  CO2 24 21*  GLUCOSE 126* 127*  BUN 13 20  CREATININE 0.67 2.00*  CALCIUM 8.2* 7.4*   PT/INR Recent Labs    03/16/20 0217  LABPROT 19.4*  INR 1.7*   CMP     Component Value Date/Time   NA 135 03/16/2020 0217   K 4.1 03/16/2020 0217   CL 104 03/16/2020 0217   CO2 21 (L) 03/16/2020 0217   GLUCOSE 127 (H) 03/16/2020 0217   BUN 20 03/16/2020 0217   CREATININE 2.00 (H) 03/16/2020 0217   CALCIUM 7.4 (L) 03/16/2020 0217    PROT 6.5 03/14/2020 1920   ALBUMIN 3.1 (L) 03/14/2020 1920   AST 18 03/14/2020 1920   ALT 16 03/14/2020 1920   ALKPHOS 61 03/14/2020 1920   BILITOT 0.2 (L) 03/14/2020 1920   GFRNONAA 31 (L) 03/16/2020 0217   GFRAA >60 09/26/2014 0850   Lipase     Component Value Date/Time   LIPASE 22 03/14/2020 1920       Studies/Results: CT ABDOMEN PELVIS W CONTRAST  Result Date: 03/14/2020 CLINICAL DATA:  Right-sided abdominal pain EXAM: CT ABDOMEN AND PELVIS WITH CONTRAST TECHNIQUE: Multidetector CT imaging of the abdomen and pelvis was performed using the standard protocol following bolus administration of intravenous contrast. CONTRAST:  OMNIPAQUE IOHEXOL 300 MG/ML  SOLN COMPARISON:  03/06/2020 FINDINGS: Lower chest: Mild dependent atelectatic changes are noted bilaterally. Hepatobiliary: Liver and gallbladder are within normal limits. There is new free fluid surrounding the majority of the liver when compared with the prior exam. Pancreas: Unremarkable. No pancreatic ductal dilatation or surrounding inflammatory changes. Spleen: Normal in size without focal abnormality. Adrenals/Urinary Tract: Adrenal glands are within normal limits. The kidneys demonstrate a normal enhancement pattern bilaterally. No renal calculi are seen. No obstructive changes are noted. The bladder is decompressed. Stomach/Bowel: The stomach is within normal limits. Mild fluid dilatation of the small bowel is noted without definitive obstructive  changes. The colon is predominately decompressed. The appendix is well visualized and shows no inflammatory changes. Mild fluid is noted surrounding the small bowel loops consistent with enteritis. There are peripherally enhancing fluid collections identified deep within the pelvis. One of these is noted anterior to the rectum in the pelvic cul-de-sac measuring 5.8 x 3.3 cm best seen on image number 74 of series 2. These do not correspond to any bowel loops and are felt to represent  developing abscesses. No definitive free air is seen. This previously represented some minimal free fluid within the pelvis. A second collection is noted just superior to the left measuring 4.5 cm best seen on image number 73 of series 2. Vascular/Lymphatic: No significant vascular findings are present. No enlarged abdominal or pelvic lymph nodes. Reproductive: Status post hysterectomy. No adnexal masses. Other: As described above there are 2 areas of peripherally enhancing fluid collection deep within the pelvis consistent with developing abscesses. Some free fluid is also noted within the abdomen likely reactive in nature. Musculoskeletal: No acute or significant osseous findings. IMPRESSION: Peripherally enhancing fluid collections deep within the pelvis consistent with developing abscesses. Free fluid is also noted within the abdomen. These changes are new from the prior exam. Fluid-filled loops of small bowel with mild mucosal hyperemia consistent with enteritis and likely in part reactive to the changes in the pelvis. Normal-appearing appendix. Electronically Signed   By: Alcide Clever M.D.   On: 03/14/2020 21:32   DG Chest Portable 1 View  Result Date: 03/14/2020 CLINICAL DATA:  COVID positive.  Fever and abdominal pain EXAM: PORTABLE CHEST 1 VIEW COMPARISON:  09/21/2015 FINDINGS: Heart size and pulmonary vascularity are normal. Lungs are clear. No airspace disease or consolidation. No pleural effusions. No pneumothorax. Mediastinal contours appear intact. IMPRESSION: No active disease. Electronically Signed   By: Burman Nieves M.D.   On: 03/14/2020 21:22    Anti-infectives: Anti-infectives (From admission, onward)   Start     Dose/Rate Route Frequency Ordered Stop   03/15/20 2300  vancomycin (VANCOCIN) IVPB 1000 mg/200 mL premix        1,000 mg 200 mL/hr over 60 Minutes Intravenous Every 12 hours 03/15/20 1028     03/15/20 0830  vancomycin (VANCOCIN) IVPB 1000 mg/200 mL premix       "Followed  by" Linked Group Details   1,000 mg 200 mL/hr over 60 Minutes Intravenous  Once 03/15/20 0722 03/15/20 0940   03/15/20 0800  piperacillin-tazobactam (ZOSYN) IVPB 3.375 g        3.375 g 12.5 mL/hr over 240 Minutes Intravenous Every 8 hours 03/15/20 0722     03/15/20 0730  vancomycin (VANCOCIN) IVPB 1000 mg/200 mL premix       "Followed by" Linked Group Details   1,000 mg 200 mL/hr over 60 Minutes Intravenous  Once 03/15/20 0722 03/15/20 0835   03/14/20 2045  piperacillin-tazobactam (ZOSYN) IVPB 3.375 g        3.375 g 100 mL/hr over 30 Minutes Intravenous  Once 03/14/20 2032 03/14/20 2113       Assessment/Plan DM HLD 3 months s/p uncomplicated robotic hysterectomy and bilateral salpingectomy and umbilical hernia repair with biologic mesh 12/25/2019 Covid+ 03/01/20 AKI - Cr up 2 from 0.67  Sepsis Enteritis with free fluid and pelvic abscesses, etiology unclear - IR to drain pelvic abscess today - keep NPO - continue broad spectrum IV antibiotics - give another 500cc bolus   ID - zosyn/vancomycin FEN - IVF, NPO VTE - SCDs, lovenox Foley -  none   LOS: 1 day    Franne Forts, [facility] 03/16/2020, 9:15 AM Please see Amion for pager number during day hours 7:00am-4:30pm

## 2020-03-16 NOTE — Plan of Care (Signed)

## 2020-03-16 NOTE — Progress Notes (Addendum)
PROGRESS NOTE                                                                                                                                                                                                             Patient Demographics:    Katherine Cabrera, is a 45 y.o. female, DOB - 08/11/1975, ZOX:[id number]  Outpatient Primary MD for the patient is Patient, No Pcp Per   Admit date - 03/14/2020   LOS - 1  Chief Complaint  Patient presents with  . Abdominal Pain       Brief Narrative: Patient is a 45 y.o. female with PMHx of HTN, HLD, DM-2, morbid obesity-recent history of hysterectomy/bilateral salpingo-oophorectomy/umbilical hernia repair in November 2021-[facility]-presenting with worsening abdominal pain-upon imaging studies-found to have pelvic abscess.  She tested on 12/31 for COVID-19 infection-currently asymptomatic.  Does not require isolation.   COVID-19 vaccinated status: Vaccinated-J&J vaccine x1  Significant Events: 1/22>> Admit to [facility] for lower abdominal pain-found to have sepsis due to pelvic abscess.  Significant studies: 1/21>> chest x-ray: No active disease. 1/22>> CT abdomen/pelvis: Fluid collections deep within the pelvis-consistent with developing abscesses.  Fluid-filled loops of small bowel/mucosal hyperemia-consistent with enteritis.  COVID-19 medications: None  Antibiotics: Vancomycin: 1/22>> Zosyn: 1/22>>  Microbiology data: 1/22 >>blood culture: No growth  Procedures: None  Consults: IR, general surgery  DVT prophylaxis: enoxaparin (LOVENOX) injection 40 mg Start: 03/17/20 1000    Subjective:    Katherine SimplerLakisha Tomaszewski today continues to have abdominal pain-no nausea vomiting   Assessment  & Plan :   Sepsis due to pelvic abscesses and enteritis: Sepsis physiology improving-she does not have a toxic appearance-but blood pressure soft-continue IV fluids-we  will add low-dose midodrine-remains on broad-spectrum antibiotics.  General surgery/IR following with plans for CT-guided pelvic drain placement today.  Follow culture data.  AKI: Likely hemodynamically mediated due to sepsis-lisinopril use-increasing IV fluids-starting midodrine to provide more BP support.  Follow closely.  Avoid nephrotoxic agents.  Normocytic anemia: Due to acute illness and IV fluid dilution.  Follow CBC  HTN: BP soft-continue to hold antihypertensives  HLD: Continue statin  DM-2 (A1c 6.2 on 1/23): CBG stable-on SSI-oral hypoglycemic agents remain on hold  Recent Labs    03/15/20 1750 03/16/20 0847  GLUCAP 100* 101*   Recent history of COVID-19 infection: Diagnosed on 12/31-see  report below.  Asymptomatic currently-does not require isolation.  Lab Results  Component Value Date   SARSCOV2NAA POSITIVE (A) 03/14/2020       Morbid Obesity: Estimated body mass index is 37.55 kg/m as calculated from the following:   Height as of this encounter: 5\' 3"  (1.6 m).   Weight as of this encounter: 96.2 kg.    GI prophylaxis:H2 Blocker  ABG: No results found for: PHART, PCO2ART, PO2ART, HCO3, TCO2, ACIDBASEDEF, O2SAT  Vent Settings: N/A   Condition -Stable  Family Communication  : Patient  to call family herself-she is awake and alert.  Code Status :  Full Code  Diet :  Diet Order            Diet NPO time specified Except for: Sips with Meds, Ice Chips  Diet effective now                  Disposition Plan  :   Status is: Inpatient  Remains inpatient appropriate because:Inpatient level of care appropriate due to severity of illness   Dispo: The patient is from: Home              Anticipated d/c is to: Home              Anticipated d/c date is: > 3 days              Patient currently is not medically stable to d/c.   Difficult to place patient No   Barriers to discharge: Sepsis with pelvic abscess-on IV antibiotics-awaiting CT-guided drainage  by IR.  Antimicorbials  :    Anti-infectives (From admission, onward)   Start     Dose/Rate Route Frequency Ordered Stop   03/15/20 2300  vancomycin (VANCOCIN) IVPB 1000 mg/200 mL premix        1,000 mg 200 mL/hr over 60 Minutes Intravenous Every 12 hours 03/15/20 1028     03/15/20 0830  vancomycin (VANCOCIN) IVPB 1000 mg/200 mL premix       "Followed by" Linked Group Details   1,000 mg 200 mL/hr over 60 Minutes Intravenous  Once 03/15/20 0722 03/15/20 0940   03/15/20 0800  piperacillin-tazobactam (ZOSYN) IVPB 3.375 g        3.375 g 12.5 mL/hr over 240 Minutes Intravenous Every 8 hours 03/15/20 0722     03/15/20 0730  vancomycin (VANCOCIN) IVPB 1000 mg/200 mL premix       "Followed by" Linked Group Details   1,000 mg 200 mL/hr over 60 Minutes Intravenous  Once 03/15/20 0722 03/15/20 0835   03/14/20 2045  piperacillin-tazobactam (ZOSYN) IVPB 3.375 g        3.375 g 100 mL/hr over 30 Minutes Intravenous  Once 03/14/20 2032 03/14/20 2113      Inpatient Medications  Scheduled Meds: . atorvastatin  40 mg Oral QHS  . [START ON 03/17/2020] enoxaparin (LOVENOX) injection  40 mg Subcutaneous Q24H  . fentaNYL      . insulin aspart  0-6 Units Subcutaneous TID WC  . lidocaine      . midazolam       Continuous Infusions: . sodium chloride 100 mL/hr at 03/15/20 1213  . famotidine (PEPCID) IV 20 mg (03/16/20 0039)  . lactated ringers    . piperacillin-tazobactam (ZOSYN)  IV 3.375 g (03/16/20 0059)  . vancomycin 1,000 mg (03/15/20 2309)   PRN Meds:.acetaminophen **OR** acetaminophen, albuterol, chlorpheniramine-HYDROcodone, fentaNYL (SUBLIMAZE) injection, fentaNYL, guaiFENesin-dextromethorphan, ondansetron **OR** ondansetron (ZOFRAN) IV   Time Spent in minutes  25  See all Orders from today for further details   Jeoffrey Massed M.D on 03/16/2020 at 10:15 AM  To page go to [email or web address] - use universal password  [facility] -  Office  [phone]    Objective:   Vitals:    03/16/20 0048 03/16/20 0153 03/16/20 0300 03/16/20 0413  BP: (!) 96/51     Pulse: (!) 121     Resp: (!) 22 19 (!) 21 19  Temp: 97.7 F (36.5 C)     TempSrc: Oral     SpO2: 96%     Weight:      Height:        Wt Readings from Last 3 Encounters:  03/14/20 96.2 kg  03/06/20 96.2 kg  06/11/19 93.4 kg     Intake/Output Summary (Last 24 hours) at 03/16/2020 1015 Last data filed at 03/16/2020 0600 Gross per 24 hour  Intake 1660.03 ml  Output --  Net 1660.03 ml     Physical Exam Gen Exam:Alert awake-not in any distress HEENT:atraumatic, normocephalic Chest: B/L clear to auscultation anteriorly CVS:S1S2 regular Abdomen:soft-but diffusely tender-mostly in the lower abdomen. Extremities:no edema Neurology: Non focal Skin: no rash   Data Review:    CBC Recent Labs  Lab 03/14/20 1920 03/15/20 1443 03/16/20 0217  WBC 2.2*  --  9.2  HGB 11.1* 11.6* 10.3*  HCT 33.9* 36.6 32.0*  PLT 242  --  187  MCV 81.9  --  81.6  MCH 26.8  --  26.3  MCHC 32.7  --  32.2  RDW 13.9  --  14.2  LYMPHSABS 0.2*  --   --   MONOABS 0.0*  --   --   EOSABS 0.0  --   --   BASOSABS 0.0  --   --     Chemistries  Recent Labs  Lab 03/14/20 1920 03/16/20 0217  NA 137 135  K 3.6 4.1  CL 102 104  CO2 24 21*  GLUCOSE 126* 127*  BUN 13 20  CREATININE 0.67 2.00*  CALCIUM 8.2* 7.4*  AST 18  --   ALT 16  --   ALKPHOS 61  --   BILITOT 0.2*  --    ------------------------------------------------------------------------------------------------------------------ No results for input(s): CHOL, HDL, LDLCALC, TRIG, CHOLHDL, LDLDIRECT in the last 72 hours.  Lab Results  Component Value Date   HGBA1C 6.2 (H) 03/16/2020   ------------------------------------------------------------------------------------------------------------------ No results for input(s): TSH, T4TOTAL, T3FREE, THYROIDAB in the last 72 hours.  Invalid input(s):  FREET3 ------------------------------------------------------------------------------------------------------------------ No results for input(s): VITAMINB12, FOLATE, FERRITIN, TIBC, IRON, RETICCTPCT in the last 72 hours.  Coagulation profile Recent Labs  Lab 03/16/20 0217  INR 1.7*    No results for input(s): DDIMER in the last 72 hours.  Cardiac Enzymes No results for input(s): CKMB, TROPONINI, MYOGLOBIN in the last 168 hours.  Invalid input(s): CK ------------------------------------------------------------------------------------------------------------------ No results found for: BNP  Micro Results Recent Results (from the past 240 hour(s))  Wet prep, genital     Status: Abnormal   Collection Time: 03/06/20 11:43 PM  Result Value Ref Range Status   Yeast Wet Prep HPF POC NONE SEEN NONE SEEN Final   Trich, Wet Prep NONE SEEN NONE SEEN Final   Clue Cells Wet Prep HPF POC NONE SEEN NONE SEEN Final   WBC, Wet Prep HPF POC MANY (A) NONE SEEN Final   Sperm NONE SEEN  Final    Comment: Performed at [facility], [address], [place], [place] [address]  SARS Coronavirus 2 by RT PCR (hospital order, performed in [facility] hospital lab) Nasopharyngeal Peripheral     Status: Abnormal   Collection Time: 03/14/20  7:20 PM   Specimen: Peripheral; Nasopharyngeal  Result Value Ref Range Status   SARS Coronavirus 2 POSITIVE (A) NEGATIVE Final    Comment: RESULT CALLED TO, READ BACK BY AND VERIFIED WITH: NEAL KELLIE RN AT 2044 ON 03/14/20 BY I.[email or web address] (NOTE) SARS-CoV-2 target nucleic acids are DETECTED  SARS-CoV-2 RNA is generally detectable in upper respiratory specimens  during the acute phase of infection.  Positive results are indicative  of the presence of the identified virus, but do not rule out bacterial infection or co-infection with other pathogens not detected by the test.  Clinical correlation with patient history and  other diagnostic information is  necessary to determine patient infection status.  The expected result is negative.  Fact Sheet for Patients:   BoilerBrush.com.cy   Fact Sheet for Healthcare Providers:   https://pope.com/    This test is not yet approved or cleared by the [place] FDA and  has been authorized for detection and/or diagnosis of SARS-CoV-2 by FDA under an Emergency Use Authorization (EUA).  This EUA will remain in effect (meani ng this test can be used) for the duration of  the COVID-19 declaration under Section 564(b)(1) of the Act, 21 U.S.C. section 360-bbb-3(b)(1), unless the authorization is terminated or revoked sooner.  Performed at [facility], [address] [place], [place] [address]   Blood Culture (routine x 2)     Status: None (Preliminary result)   Collection Time: 03/14/20  7:30 PM   Specimen: BLOOD  Result Value Ref Range Status   Specimen Description   Final    BLOOD RIGHT ANTECUBITAL Performed at [facility], [address] [place], [place] [address]    Special Requests   Final    BOTTLES DRAWN AEROBIC AND ANAEROBIC Blood Culture adequate volume Performed at [facility], [address] [place], [place] [address]    Culture   Final    NO GROWTH < 24 HOURS Performed at [facility], [address], [place], [place] [address]    Report Status PENDING  Incomplete  Blood Culture (routine x 2)     Status: None (Preliminary result)   Collection Time: 03/15/20 11:57 AM   Specimen: BLOOD  Result Value Ref Range Status   Specimen Description BLOOD LEFT ANTECUBITAL  Final   Special Requests   Final    BOTTLES DRAWN AEROBIC ONLY Blood Culture adequate volume   Culture   Final    NO GROWTH < 12 HOURS Performed at [facility], [address], [place], [place] [address]    Report Status PENDING  Incomplete    Radiology Reports CT ABDOMEN PELVIS W CONTRAST  Result Date:  03/14/2020 CLINICAL DATA:  Right-sided abdominal pain EXAM: CT ABDOMEN AND PELVIS WITH CONTRAST TECHNIQUE: Multidetector CT imaging of the abdomen and pelvis was performed using the standard protocol following bolus administration of intravenous contrast. CONTRAST:  OMNIPAQUE IOHEXOL 300 MG/ML  SOLN COMPARISON:  03/06/2020 FINDINGS: Lower chest: Mild dependent atelectatic changes are noted bilaterally. Hepatobiliary: Liver and gallbladder are within normal limits. There is new free fluid surrounding the majority of the liver when compared with the prior exam. Pancreas: Unremarkable. No pancreatic ductal dilatation or surrounding inflammatory changes. Spleen: Normal in size without focal abnormality. Adrenals/Urinary Tract: Adrenal glands are  within normal limits. The kidneys demonstrate a normal enhancement pattern bilaterally. No renal calculi are seen. No obstructive changes are noted. The bladder is decompressed. Stomach/Bowel: The stomach is within normal limits. Mild fluid dilatation of the small bowel is noted without definitive obstructive changes. The colon is predominately decompressed. The appendix is well visualized and shows no inflammatory changes. Mild fluid is noted surrounding the small bowel loops consistent with enteritis. There are peripherally enhancing fluid collections identified deep within the pelvis. One of these is noted anterior to the rectum in the pelvic cul-de-sac measuring 5.8 x 3.3 cm best seen on image number 74 of series 2. These do not correspond to any bowel loops and are felt to represent developing abscesses. No definitive free air is seen. This previously represented some minimal free fluid within the pelvis. A second collection is noted just superior to the left measuring 4.5 cm best seen on image number 73 of series 2. Vascular/Lymphatic: No significant vascular findings are present. No enlarged abdominal or pelvic lymph nodes. Reproductive: Status post hysterectomy. No  adnexal masses. Other: As described above there are 2 areas of peripherally enhancing fluid collection deep within the pelvis consistent with developing abscesses. Some free fluid is also noted within the abdomen likely reactive in nature. Musculoskeletal: No acute or significant osseous findings. IMPRESSION: Peripherally enhancing fluid collections deep within the pelvis consistent with developing abscesses. Free fluid is also noted within the abdomen. These changes are new from the prior exam. Fluid-filled loops of small bowel with mild mucosal hyperemia consistent with enteritis and likely in part reactive to the changes in the pelvis. Normal-appearing appendix. Electronically Signed   By: Alcide Clever M.D.   On: 03/14/2020 21:32   CT ABDOMEN PELVIS W CONTRAST  Result Date: 03/06/2020 CLINICAL DATA:  Right lower quadrant abdominal pain. EXAM: CT ABDOMEN AND PELVIS WITH CONTRAST TECHNIQUE: Multidetector CT imaging of the abdomen and pelvis was performed using the standard protocol following bolus administration of intravenous contrast. CONTRAST:  OMNIPAQUE IOHEXOL 300 MG/ML  SOLN COMPARISON:  None. FINDINGS: Lower chest: Choose bilateral pleural effusions and basilar atelectasis. Heart is normal in size. Hepatobiliary: No focal liver abnormality is seen. No gallstones, gallbladder wall thickening, or biliary dilatation. Pancreas: No ductal dilatation or inflammation. Spleen: Normal in size without focal abnormality. Adrenals/Urinary Tract: Normal adrenal glands. No hydronephrosis or perinephric edema. Homogeneous renal enhancement with symmetric excretion on delayed phase imaging. Urinary bladder is partially distended without wall thickening. Stomach/Bowel: Bowel evaluation is limited in the absence of enteric contrast. Stomach is decompressed. Normal positioning of the duodenum and ligament of Treitz. There are scattered fluid-filled small bowel loops with occasional areas of wall thickening. No bowel  dilatation to suggest obstruction. Appendix is tentatively visualized and normal, series 2, image 64. No evidence of appendicitis. Liquid stool in the cecum and ascending colon. Remainder of the colon is decompressed. There is interposition of the splenic flexure posterior to the spleen under the hemidiaphragm. Scattered diverticula noted at the splenic flexure of the colon. No diverticulitis or acute colonic inflammation. Vascular/Lymphatic: The abdominal aorta is normal in caliber. Portal vein is patent. No portal venous or mesenteric gas. Multiple small retroperitoneal mesenteric nodes are not enlarged by size criteria. There is a small epitrochlear node measuring 8 mm. Reproductive: Hysterectomy with soft tissue prominence in the region of the cervix. Ovary is not definitively visualized on CT. Other: Small amount of scattered free fluid primarily in the pelvis. Mild edema of the mesentery and anterior  omental fat. No free air or focal fluid collection. Musculoskeletal: There are no acute or suspicious osseous abnormalities. IMPRESSION: 1. No evidence of appendicitis. 2. Scattered fluid-filled small bowel loops with occasional areas of wall thickening, suggesting enteritis. No obstruction. 3. Small amount of scattered free fluid in the pelvis, likely reactive. Mild edema of the mesentery and anterior omental fat, typically reactive. 4. Trace bilateral pleural effusions and basilar atelectasis. Electronically Signed   By: Narda RutherfordMelanie  Sanford M.D.   On: 03/06/2020 22:50   DG Chest Portable 1 View  Result Date: 03/14/2020 CLINICAL DATA:  COVID positive.  Fever and abdominal pain EXAM: PORTABLE CHEST 1 VIEW COMPARISON:  09/21/2015 FINDINGS: Heart size and pulmonary vascularity are normal. Lungs are clear. No airspace disease or consolidation. No pleural effusions. No pneumothorax. Mediastinal contours appear intact. IMPRESSION: No active disease. Electronically Signed   By: Burman NievesWilliam  Stevens M.D.   On: 03/14/2020  21:22

## 2020-03-16 NOTE — Progress Notes (Addendum)
Interventional Radiology Progress Note  CT of pelvis in prone position shows transit of free fluid into anterior pelvis and no significant focal contained abscess/fluid collection in posterior pelvis. The 4 cm focal fluid in left pelvis remains present posterior to bladder with no posterior percutaneous window available for safe aspiration or drainage of this area. No indication for CT guided aspiration or drainage currently.  Jodi Marble. Fredia Sorrow, M.D Pager:  [phone]

## 2020-03-16 NOTE — Progress Notes (Signed)
Pt's husband let RN know that lately when him and wife had been trying to have sexual intercourse, it felt like there was something sharp 'like barbs' inside her vagina. After these times trying to have intercourse she would have abdominal pain. He reported that after their last time having intercourse was when she had extreme abdominal pain and ended up being admitted to the hospital.  Husband addressed his concerns to this RN that he is afraid something could have been left inside her from her recent hysterectomy.This was explained to night shift and MD aware.

## 2020-03-16 NOTE — Progress Notes (Signed)
Spoke with on call MD Mansy concerning Pt's red MEWS. Pt vitals are close to baseline. 250 ml bolus ordered.

## 2020-03-16 NOTE — Sedation Documentation (Signed)
Procedure canceled per Dr. Fredia Sorrow. No collection to drain.

## 2020-03-16 NOTE — Progress Notes (Signed)
Pharmacy Antibiotic Note  Katherine Cabrera is a 45 y.o. female admitted on 03/14/2020 with sepsis with concern for intra-abominal infection.  Pharmacy has been consulted for vancomycin and Zosyn dosing.  Vancomycin dose will be adjusted based on worsening renal function today.  Scr jump from 0.69 > 2 overnight.  Predicted AUC with 1000mg  IV q24hours is 557.    Plan: Vancomycin dose adjusted to 1000mg  IV q24 hours  Zosyn 3.375g IV Q8H (4-hour infusion). Monitor renal function.  Height: 5\' 3"  (160 cm) Weight: 96.2 kg (212 lb) IBW/kg (Calculated) : 52.4  Temp (24hrs), Avg:97.7 F (36.5 C), Min:97.7 F (36.5 C), Max:97.7 F (36.5 C)  Recent Labs  Lab 03/14/20 1920 03/14/20 2207 03/15/20 1157 03/16/20 0217  WBC 2.2*  --   --  9.2  CREATININE 0.67  --   --  2.00*  LATICACIDVEN  --  1.6 1.8  --     Estimated Creatinine Clearance: 39.6 mL/min (A) (by C-G formula based on SCr of 2 mg/dL (H)).    No Known Allergies   Thank you for allowing pharmacy to be a part of this patient's care.  [id number], PharmD PGY-1 Acute Care Pharmacy Resident Office: [phone] 03/16/2020 1:50 PM

## 2020-03-17 ENCOUNTER — Inpatient Hospital Stay (HOSPITAL_COMMUNITY): Payer: BC Managed Care – PPO | Admitting: Anesthesiology

## 2020-03-17 ENCOUNTER — Inpatient Hospital Stay (HOSPITAL_COMMUNITY): Payer: BC Managed Care – PPO

## 2020-03-17 ENCOUNTER — Encounter (HOSPITAL_COMMUNITY): Payer: Self-pay | Admitting: Internal Medicine

## 2020-03-17 ENCOUNTER — Encounter (HOSPITAL_COMMUNITY)
Admission: EM | Disposition: A | Discharge status: Home / Self Care | Payer: Self-pay | Source: Home / Self Care | Attending: Internal Medicine

## 2020-03-17 DIAGNOSIS — N179 Acute kidney failure, unspecified: Secondary | ICD-10-CM

## 2020-03-17 DIAGNOSIS — Z90710 Acquired absence of both cervix and uterus: Secondary | ICD-10-CM

## 2020-03-17 DIAGNOSIS — Z9889 Other specified postprocedural states: Secondary | ICD-10-CM

## 2020-03-17 DIAGNOSIS — R188 Other ascites: Secondary | ICD-10-CM

## 2020-03-17 HISTORY — PX: LAPAROSCOPY: SHX197

## 2020-03-17 LAB — POCT I-STAT, CHEM 8
BUN: 30 mg/dL — ABNORMAL HIGH (ref 6–20)
Calcium, Ion: 0.96 mmol/L — ABNORMAL LOW (ref 1.15–1.40)
Chloride: 108 mmol/L (ref 98–111)
Creatinine, Ser: 2.8 mg/dL — ABNORMAL HIGH (ref 0.44–1.00)
Glucose, Bld: 113 mg/dL — ABNORMAL HIGH (ref 70–99)
HCT: 36 % (ref 36.0–46.0)
Hemoglobin: 12.2 g/dL (ref 12.0–15.0)
Potassium: 4 mmol/L (ref 3.5–5.1)
Sodium: 141 mmol/L (ref 135–145)
TCO2: 21 mmol/L — ABNORMAL LOW (ref 22–32)

## 2020-03-17 LAB — GLUCOSE, CAPILLARY
Glucose-Capillary: 106 mg/dL — ABNORMAL HIGH (ref 70–99)
Glucose-Capillary: 103 mg/dL — ABNORMAL HIGH (ref 70–99)
Glucose-Capillary: 107 mg/dL — ABNORMAL HIGH (ref 70–99)
Glucose-Capillary: 100 mg/dL — ABNORMAL HIGH (ref 70–99)
Glucose-Capillary: 135 mg/dL — ABNORMAL HIGH (ref 70–99)

## 2020-03-17 SURGERY — LAPAROSCOPY, DIAGNOSTIC
Anesthesia: General

## 2020-03-17 MED ORDER — PROPOFOL 10 MG/ML IV BOLUS
INTRAVENOUS | Status: AC
  Filled 2020-03-17: qty 20

## 2020-03-17 MED ORDER — LIDOCAINE 2% (20 MG/ML) 5 ML SYRINGE
INTRAMUSCULAR | Status: DC | PRN
  Administered 2020-03-17: 100 mg via INTRAVENOUS

## 2020-03-17 MED ORDER — FENTANYL CITRATE (PF) 250 MCG/5ML IJ SOLN
INTRAMUSCULAR | Status: AC
  Filled 2020-03-17: qty 5

## 2020-03-17 MED ORDER — PROPOFOL 10 MG/ML IV BOLUS
INTRAVENOUS | Status: DC | PRN
  Administered 2020-03-17: 150 mg via INTRAVENOUS

## 2020-03-17 MED ORDER — ONDANSETRON HCL 4 MG/2ML IJ SOLN
INTRAMUSCULAR | Status: DC | PRN
  Administered 2020-03-17: 4 mg via INTRAVENOUS

## 2020-03-17 MED ORDER — HYDROMORPHONE HCL 1 MG/ML IJ SOLN
0.5000 mg | INTRAMUSCULAR | Status: DC | PRN
  Administered 2020-03-18: 0.5 mg via INTRAVENOUS
  Administered 2020-03-19: 1 mg via INTRAVENOUS
  Filled 2020-03-17 (×2): qty 1

## 2020-03-17 MED ORDER — LIDOCAINE 2% (20 MG/ML) 5 ML SYRINGE
INTRAMUSCULAR | Status: AC
  Filled 2020-03-17: qty 5

## 2020-03-17 MED ORDER — CHLORHEXIDINE GLUCONATE 0.12 % MT SOLN
15.0000 mL | Freq: Once | OROMUCOSAL | Status: AC
  Administered 2020-03-17: 15 mL via OROMUCOSAL
  Filled 2020-03-17: qty 15

## 2020-03-17 MED ORDER — BUPIVACAINE HCL (PF) 0.25 % IJ SOLN
INTRAMUSCULAR | Status: AC
  Filled 2020-03-17: qty 30

## 2020-03-17 MED ORDER — LACTATED RINGERS IV SOLN: INTRAVENOUS | Status: DC

## 2020-03-17 MED ORDER — METHYLENE BLUE 0.5 % INJ SOLN
INTRAVENOUS | Status: AC
  Filled 2020-03-17: qty 10

## 2020-03-17 MED ORDER — ENOXAPARIN SODIUM 30 MG/0.3ML ~~LOC~~ SOLN
30.0000 mg | SUBCUTANEOUS | Status: DC
  Administered 2020-03-18 – 2020-03-23 (×6): 30 mg via SUBCUTANEOUS
  Filled 2020-03-17 (×6): qty 0.3

## 2020-03-17 MED ORDER — PHENYLEPHRINE HCL-NACL 10-0.9 MG/250ML-% IV SOLN
INTRAVENOUS | Status: DC | PRN
  Administered 2020-03-17: 50 ug/min via INTRAVENOUS

## 2020-03-17 MED ORDER — ROCURONIUM BROMIDE 10 MG/ML (PF) SYRINGE
PREFILLED_SYRINGE | INTRAVENOUS | Status: AC
  Filled 2020-03-17: qty 10

## 2020-03-17 MED ORDER — SODIUM CHLORIDE 0.9 % IR SOLN
Status: DC | PRN
  Administered 2020-03-17: 2000 mL

## 2020-03-17 MED ORDER — MIDAZOLAM HCL 2 MG/2ML IJ SOLN
INTRAMUSCULAR | Status: AC
  Filled 2020-03-17: qty 2

## 2020-03-17 MED ORDER — BUPIVACAINE HCL (PF) 0.25 % IJ SOLN
INTRAMUSCULAR | Status: DC | PRN
  Administered 2020-03-17: 6 mL

## 2020-03-17 MED ORDER — METHYLENE BLUE 0.5 % INJ SOLN
INTRAVENOUS | Status: DC | PRN
  Administered 2020-03-17: 10 mL

## 2020-03-17 MED ORDER — PROMETHAZINE HCL 25 MG/ML IJ SOLN: 6.2500 mg | INTRAMUSCULAR | Status: DC | PRN

## 2020-03-17 MED ORDER — MIDAZOLAM HCL 5 MG/5ML IJ SOLN
INTRAMUSCULAR | Status: DC | PRN
  Administered 2020-03-17: 2 mg via INTRAVENOUS

## 2020-03-17 MED ORDER — ARTIFICIAL TEARS OPHTHALMIC OINT
TOPICAL_OINTMENT | OPHTHALMIC | Status: AC
  Filled 2020-03-17: qty 3.5

## 2020-03-17 MED ORDER — CHLORHEXIDINE GLUCONATE CLOTH 2 % EX PADS
6.0000 | MEDICATED_PAD | Freq: Every day | CUTANEOUS | Status: DC
  Administered 2020-03-17 – 2020-03-22 (×6): 6 via TOPICAL

## 2020-03-17 MED ORDER — ONDANSETRON HCL 4 MG/2ML IJ SOLN
INTRAMUSCULAR | Status: AC
  Filled 2020-03-17: qty 2

## 2020-03-17 MED ORDER — MEPERIDINE HCL 25 MG/ML IJ SOLN: 6.2500 mg | INTRAMUSCULAR | Status: DC | PRN

## 2020-03-17 MED ORDER — DEXAMETHASONE SODIUM PHOSPHATE 10 MG/ML IJ SOLN
INTRAMUSCULAR | Status: AC
  Filled 2020-03-17: qty 1

## 2020-03-17 MED ORDER — DEXAMETHASONE SODIUM PHOSPHATE 10 MG/ML IJ SOLN
INTRAMUSCULAR | Status: DC | PRN
  Administered 2020-03-17: 10 mg via INTRAVENOUS

## 2020-03-17 MED ORDER — LINEZOLID 600 MG/300ML IV SOLN
600.0000 mg | Freq: Two times a day (BID) | INTRAVENOUS | Status: DC
  Filled 2020-03-17 (×2): qty 300

## 2020-03-17 MED ORDER — ALBUMIN HUMAN 5 % IV SOLN: INTRAVENOUS | Status: DC | PRN

## 2020-03-17 MED ORDER — ROCURONIUM BROMIDE 10 MG/ML (PF) SYRINGE
PREFILLED_SYRINGE | INTRAVENOUS | Status: DC | PRN
  Administered 2020-03-17: 70 mg via INTRAVENOUS

## 2020-03-17 MED ORDER — PIPERACILLIN-TAZOBACTAM 3.375 G IVPB
3.3750 g | Freq: Three times a day (TID) | INTRAVENOUS | Status: DC
  Administered 2020-03-17 – 2020-03-18 (×3): 3.375 g via INTRAVENOUS
  Filled 2020-03-17 (×3): qty 50

## 2020-03-17 MED ORDER — PHENYLEPHRINE 40 MCG/ML (10ML) SYRINGE FOR IV PUSH (FOR BLOOD PRESSURE SUPPORT)
PREFILLED_SYRINGE | INTRAVENOUS | Status: DC | PRN
  Administered 2020-03-17: 120 ug via INTRAVENOUS

## 2020-03-17 MED ORDER — LINEZOLID 600 MG/300ML IV SOLN
600.0000 mg | Freq: Two times a day (BID) | INTRAVENOUS | Status: DC
  Administered 2020-03-17 – 2020-03-18 (×2): 600 mg via INTRAVENOUS
  Filled 2020-03-17 (×2): qty 300

## 2020-03-17 MED ORDER — ORAL CARE MOUTH RINSE: 15.0000 mL | Freq: Once | OROMUCOSAL | Status: AC

## 2020-03-17 MED ORDER — HYDROMORPHONE HCL 1 MG/ML IJ SOLN: 0.2500 mg | INTRAMUSCULAR | Status: DC | PRN

## 2020-03-17 MED ORDER — FENTANYL CITRATE (PF) 250 MCG/5ML IJ SOLN
INTRAMUSCULAR | Status: DC | PRN
  Administered 2020-03-17 (×4): 25 ug via INTRAVENOUS
  Administered 2020-03-17: 100 ug via INTRAVENOUS

## 2020-03-17 MED ORDER — SUGAMMADEX SODIUM 200 MG/2ML IV SOLN
INTRAVENOUS | Status: DC | PRN
  Administered 2020-03-17: 200 mg via INTRAVENOUS

## 2020-03-17 MED ORDER — SUCCINYLCHOLINE CHLORIDE 20 MG/ML IJ SOLN
INTRAMUSCULAR | Status: DC | PRN
  Administered 2020-03-17: 140 mg via INTRAVENOUS

## 2020-03-17 SURGICAL SUPPLY — 59 items
BIOPATCH RED 1 DISK 7.0 (GAUZE/BANDAGES/DRESSINGS) ×2 IMPLANT
BLADE CLIPPER SURG (BLADE) IMPLANT
CANISTER SUCT 3000ML PPV (MISCELLANEOUS) ×2 IMPLANT
CHLORAPREP W/TINT 26 (MISCELLANEOUS) ×2 IMPLANT
COVER SURGICAL LIGHT HANDLE (MISCELLANEOUS) ×2 IMPLANT
COVER WAND RF STERILE (DRAPES) IMPLANT
DECANTER SPIKE VIAL GLASS SM (MISCELLANEOUS) IMPLANT
DERMABOND ADVANCED (GAUZE/BANDAGES/DRESSINGS) ×1
DERMABOND ADVANCED .7 DNX12 (GAUZE/BANDAGES/DRESSINGS) ×1 IMPLANT
DRAIN CHANNEL 19F RND (DRAIN) ×2 IMPLANT
DRAPE LAPAROSCOPIC ABDOMINAL (DRAPES) ×2 IMPLANT
DRAPE WARM FLUID 44X44 (DRAPES) ×2 IMPLANT
DRSG OPSITE POSTOP 4X10 (GAUZE/BANDAGES/DRESSINGS) IMPLANT
DRSG OPSITE POSTOP 4X8 (GAUZE/BANDAGES/DRESSINGS) IMPLANT
DRSG TEGADERM 4X4.75 (GAUZE/BANDAGES/DRESSINGS) ×2 IMPLANT
ELECT BLADE 6.5 EXT (BLADE) IMPLANT
ELECT CAUTERY BLADE 6.4 (BLADE) IMPLANT
ELECT REM PT RETURN 9FT ADLT (ELECTROSURGICAL) ×2
ELECTRODE REM PT RTRN 9FT ADLT (ELECTROSURGICAL) ×1 IMPLANT
EVACUATOR SILICONE 100CC (DRAIN) ×2 IMPLANT
GAUZE 4X4 16PLY RFD (DISPOSABLE) ×2 IMPLANT
GLOVE BIO SURGEON STRL SZ 6 (GLOVE) ×2 IMPLANT
GLOVE SURG UNDER LTX SZ6.5 (GLOVE) ×2 IMPLANT
GOWN STRL REUS W/ TWL LRG LVL3 (GOWN DISPOSABLE) ×3 IMPLANT
GOWN STRL REUS W/TWL LRG LVL3 (GOWN DISPOSABLE) ×3
HANDLE SUCTION POOLE (INSTRUMENTS) IMPLANT
KIT BASIN OR (CUSTOM PROCEDURE TRAY) ×2 IMPLANT
KIT SIGMOIDOSCOPE (SET/KITS/TRAYS/PACK) ×2 IMPLANT
KIT TURNOVER KIT B (KITS) ×2 IMPLANT
LEGGING LITHOTOMY PAIR STRL (DRAPES) ×2 IMPLANT
LIGASURE IMPACT 36 18CM CVD LR (INSTRUMENTS) IMPLANT
NEEDLE INSUFFLATION 14GA 120MM (NEEDLE) ×2 IMPLANT
NS IRRIG 1000ML POUR BTL (IV SOLUTION) ×4 IMPLANT
PACK GENERAL/GYN (CUSTOM PROCEDURE TRAY) ×2 IMPLANT
PAD ARMBOARD 7.5X6 YLW CONV (MISCELLANEOUS) ×4 IMPLANT
PENCIL SMOKE EVACUATOR (MISCELLANEOUS) IMPLANT
SCISSORS LAP 5X35 DISP (ENDOMECHANICALS) IMPLANT
SET IRRIG TUBING LAPAROSCOPIC (IRRIGATION / IRRIGATOR) ×2 IMPLANT
SET TUBE SMOKE EVAC HIGH FLOW (TUBING) ×2 IMPLANT
SLEEVE ENDOPATH XCEL 5M (ENDOMECHANICALS) ×6 IMPLANT
SPECIMEN JAR LARGE (MISCELLANEOUS) IMPLANT
SPONGE LAP 18X18 RF (DISPOSABLE) IMPLANT
STAPLER VISISTAT 35W (STAPLE) IMPLANT
SUCTION POOLE HANDLE (INSTRUMENTS)
SUT ETHILON 2 0 FS 18 (SUTURE) ×2 IMPLANT
SUT MNCRL AB 4-0 PS2 18 (SUTURE) ×2 IMPLANT
SUT PDS AB 1 TP1 96 (SUTURE) IMPLANT
SUT SILK 2 0 SH CR/8 (SUTURE) IMPLANT
SUT SILK 2 0 TIES 10X30 (SUTURE) IMPLANT
SUT SILK 3 0 SH CR/8 (SUTURE) IMPLANT
SUT SILK 3 0 TIES 10X30 (SUTURE) IMPLANT
SUT VIC AB 3-0 SH 18 (SUTURE) IMPLANT
TOWEL GREEN STERILE (TOWEL DISPOSABLE) ×2 IMPLANT
TOWEL GREEN STERILE FF (TOWEL DISPOSABLE) ×2 IMPLANT
TRAY FOLEY MTR SLVR 16FR STAT (SET/KITS/TRAYS/PACK) ×2 IMPLANT
TRAY LAPAROSCOPIC MC (CUSTOM PROCEDURE TRAY) ×2 IMPLANT
TROCAR XCEL BLUNT TIP 100MML (ENDOMECHANICALS) IMPLANT
TROCAR XCEL NON-BLD 11X100MML (ENDOMECHANICALS) IMPLANT
TROCAR XCEL NON-BLD 5MMX100MML (ENDOMECHANICALS) ×2 IMPLANT

## 2020-03-17 NOTE — Consult Note (Signed)
OBSTETRICS AND GYNECOLOGY ATTENDING CONSULT NOTE  Consult Date: 03/17/2020  Reason for Consult: Pelvic abscess s/p RATH in 12/2019 Consulting Provider: Dr. Romana Juniper, General Surgery   Brief History: Katherine Cabrera is a 45 y.o. female with history of DM, HTN s/p robotic hysterectomy and bilateral salpingectomy and umbilical hernia repair with biologic mesh 12/25/2019 at [facility], admitted with sepsis, enteritis with free fluid and pelvic abscesses, etiology unclear. Our service is being consulted for intraoperative exam under anesthesia, there is a concern about vaginal cuff perforation.  Assessment/Plan: Patient met and reviewed plan of pelvic exam under anesthesia. She consented to this, also consented to any other possible procedures needed from a GYN standpoint. Will go to OR when ready.  Thank you for involving [place] in the care of this patient.  Please call [phone] [facility] OB/GYN Consult Attending Monday-Friday 8am - 5pm) or [phone] [facility] OB/GYN Attending On Call all day, every day) for any gynecologic concerns at any time.    Total consultation time including face-to-face time with patient (>50% of time), reviewing chart and documentation: 30 minutes  Verita Schneiders, MD, [place], [facility] for [facility] [place] Phone: [phone]  ~~~~~~~~~~~~~~~~~~~~~~~~~~~~~~~~~~~~~~~~~~~~~~~~~~~~~~~~~~~~~~~~~~~~~~~~~~~~~~~~~   Patient Active Problem List   Diagnosis Date Noted  . Pelvic abscess in female 03/15/2020  . Transient hypotension 03/15/2020  . Enteritis 03/15/2020  . COVID-19 virus infection 03/15/2020  . Normocytic anemia 03/15/2020  . Type 2 diabetes mellitus with hyperlipidemia ([place]) 03/15/2020  . Sepsis ([place]) 03/14/2020    Past Medical History:  Diagnosis Date  . De Quervain's disease (radial styloid tenosynovitis)   . Diabetes mellitus without complication ([place])   . Hypertension      Past Surgical History:  Procedure Laterality Date  . ABDOMINAL HYSTERECTOMY    . CESAREAN SECTION    . HERNIA REPAIR      History reviewed. No pertinent family history.  Social History:  reports that she has never smoked. She has never used smokeless tobacco. She reports that she does not drink alcohol and does not use drugs.  Allergies: No Known Allergies  Medications:  I have reviewed the patient's current medications. Prior to Admission:  Medications Prior to Admission  Medication Sig Dispense Refill Last Dose  . atorvastatin (LIPITOR) 40 MG tablet Take 40 mg by mouth at bedtime.   Past Week at Unknown time  . dicyclomine (BENTYL) 20 MG tablet Take 1 tablet (20 mg total) by mouth 2 (two) times daily. 20 tablet 0 Past Week at Unknown time  . lisinopril (ZESTRIL) 2.5 MG tablet Take 2.5 mg by mouth daily.   Past Week at Unknown time  . metFORMIN (GLUCOPHAGE-XR) 500 MG 24 hr tablet Take 1,000 mg by mouth 2 (two) times daily.   Past Week at Unknown time  . Multiple Vitamin (MULTI-VITAMIN) tablet Take 1 tablet by mouth daily.   Past Week at Unknown time  . naproxen (NAPROSYN) 500 MG tablet Take 1 tablet (500 mg total) by mouth 2 (two) times daily. 30 tablet 0 Past Week at Unknown time  . Continuous Blood Gluc Sensor (FREESTYLE LIBRE 14 DAY SENSOR) MISC Apply 1 each topically every 14 (fourteen) days.   Unknown at Unknown time   Scheduled: . [MAR Hold] atorvastatin  40 mg Oral QHS  . [MAR Hold] enoxaparin (LOVENOX) injection  40 mg Subcutaneous Q24H  . [MAR Hold] insulin aspart  0-6 Units Subcutaneous TID WC  . [MAR Hold] midodrine  5 mg  Oral TID WC   Continuous: . sodium chloride 150 mL/hr at 03/16/20 1412  . [MAR Hold] famotidine (PEPCID) IV 20 mg (03/16/20 1720)  . lactated ringers    . [MAR Hold] piperacillin-tazobactam (ZOSYN)  IV 3.375 g (03/17/20 0007)  . [MAR Hold] vancomycin 1,000 mg (03/16/20 2234)   PRN:[MAR Hold] acetaminophen **OR** [MAR Hold] acetaminophen, [MAR  Hold] albuterol, [MAR Hold] chlorpheniramine-HYDROcodone, [MAR Hold] fentaNYL (SUBLIMAZE) injection, [MAR Hold] guaiFENesin-dextromethorphan, methylene blue, [MAR Hold] ondansetron **OR** [MAR Hold] ondansetron (ZOFRAN) IV, sodium chloride irrigation Anti-infectives (From admission, onward)   Start     Dose/Rate Route Frequency Ordered Stop   03/16/20 2300  [MAR Hold]  vancomycin (VANCOCIN) IVPB 1000 mg/200 mL premix        (MAR Hold since Mon 03/17/2020 at 0913.Hold Reason: Transfer to a Procedural area.)   1,000 mg 200 mL/hr over 60 Minutes Intravenous Every 24 hours 03/16/20 1347     03/15/20 2300  vancomycin (VANCOCIN) IVPB 1000 mg/200 mL premix  Status:  Discontinued        1,000 mg 200 mL/hr over 60 Minutes Intravenous Every 12 hours 03/15/20 1028 03/16/20 1347   03/15/20 0830  vancomycin (VANCOCIN) IVPB 1000 mg/200 mL premix       "Followed by" Linked Group Details   1,000 mg 200 mL/hr over 60 Minutes Intravenous  Once 03/15/20 0722 03/15/20 0940   03/15/20 0800  [MAR Hold]  piperacillin-tazobactam (ZOSYN) IVPB 3.375 g        (MAR Hold since Mon 03/17/2020 at 0913.Hold Reason: Transfer to a Procedural area.)   3.375 g 12.5 mL/hr over 240 Minutes Intravenous Every 8 hours 03/15/20 0722     03/15/20 0730  vancomycin (VANCOCIN) IVPB 1000 mg/200 mL premix       "Followed by" Linked Group Details   1,000 mg 200 mL/hr over 60 Minutes Intravenous  Once 03/15/20 0722 03/15/20 0835   03/14/20 2045  piperacillin-tazobactam (ZOSYN) IVPB 3.375 g        3.375 g 100 mL/hr over 30 Minutes Intravenous  Once 03/14/20 2032 03/14/20 2113      Labs and Imaging: Results for orders placed or performed during the hospital encounter of 03/14/20 (from the past 72 hour(s))  Comprehensive metabolic panel     Status: Abnormal   Collection Time: 03/14/20  7:20 PM  Result Value Ref Range   Sodium 137 135 - 145 mmol/L   Potassium 3.6 3.5 - 5.1 mmol/L   Chloride 102 98 - 111 mmol/L   CO2 24 22 - 32 mmol/L    Glucose, Bld 126 (H) 70 - 99 mg/dL    Comment: Glucose reference range applies only to samples taken after fasting for at least 8 hours.   BUN 13 6 - 20 mg/dL   Creatinine, Ser 0.67 0.44 - 1.00 mg/dL   Calcium 8.2 (L) 8.9 - 10.3 mg/dL   Total Protein 6.5 6.5 - 8.1 g/dL   Albumin 3.1 (L) 3.5 - 5.0 g/dL   AST 18 15 - 41 U/L   ALT 16 0 - 44 U/L   Alkaline Phosphatase 61 38 - 126 U/L   Total Bilirubin 0.2 (L) 0.3 - 1.2 mg/dL   GFR, Estimated >60 >60 mL/min    Comment: (NOTE) Calculated using the CKD-EPI Creatinine Equation (2021)    Anion gap 11 5 - 15    Comment: Performed at [facility], [address] [place], [place] [address]  CBC with Differential     Status: Abnormal  Collection Time: 03/14/20  7:20 PM  Result Value Ref Range   WBC 2.2 (L) 4.0 - 10.5 K/uL   RBC 4.14 3.87 - 5.11 MIL/uL   Hemoglobin 11.1 (L) 12.0 - 15.0 g/dL   HCT 33.9 (L) 36.0 - 46.0 %   MCV 81.9 80.0 - 100.0 fL   MCH 26.8 26.0 - 34.0 pg   MCHC 32.7 30.0 - 36.0 g/dL   RDW 13.9 11.5 - 15.5 %   Platelets 242 150 - 400 K/uL   nRBC 0.0 0.0 - 0.2 %   Neutrophils Relative % 89 %   Neutro Abs 2.0 1.7 - 7.7 K/uL   Lymphocytes Relative 9 %   Lymphs Abs 0.2 (L) 0.7 - 4.0 K/uL   Monocytes Relative 0 %   Monocytes Absolute 0.0 (L) 0.1 - 1.0 K/uL   Eosinophils Relative 1 %   Eosinophils Absolute 0.0 0.0 - 0.5 K/uL   Basophils Relative 1 %   Basophils Absolute 0.0 0.0 - 0.1 K/uL   WBC Morphology MORPHOLOGY UNREMARKABLE    RBC Morphology MORPHOLOGY UNREMARKABLE    Smear Review Normal platelet morphology    Immature Granulocytes 0 %   Abs Immature Granulocytes 0.00 0.00 - 0.07 K/uL    Comment: Performed at [facility], [address] [place], [place] [address]  Lipase, blood     Status: None   Collection Time: 03/14/20  7:20 PM  Result Value Ref Range   Lipase 22 11 - 51 U/L    Comment: Performed at [facility], [address] [place], [place] [address]  SARS Coronavirus 2  by RT PCR (hospital order, performed in [facility] hospital lab) Nasopharyngeal Peripheral     Status: Abnormal   Collection Time: 03/14/20  7:20 PM   Specimen: Peripheral; Nasopharyngeal  Result Value Ref Range   SARS Coronavirus 2 POSITIVE (A) NEGATIVE    Comment: RESULT CALLED TO, READ BACK BY AND VERIFIED WITH: NEAL KELLIE RN AT 2044 ON 03/14/20 BY I.SUGUT (NOTE) SARS-CoV-2 target nucleic acids are DETECTED  SARS-CoV-2 RNA is generally detectable in upper respiratory specimens  during the acute phase of infection.  Positive results are indicative  of the presence of the identified virus, but do not rule out bacterial infection or co-infection with other pathogens not detected by the test.  Clinical correlation with patient history and  other diagnostic information is necessary to determine patient infection status.  The expected result is negative.  Fact Sheet for Patients:   StrictlyIdeas.no   Fact Sheet for Healthcare Providers:   BankingDealers.co.za    This test is not yet approved or cleared by the [place] FDA and  has been authorized for detection and/or diagnosis of SARS-CoV-2 by FDA under an Emergency Use Authorization (EUA).  This EUA will remain in effect (meani ng this test can be used) for the duration of  the COVID-19 declaration under Section 564(b)(1) of the Act, 21 U.S.C. section 360-bbb-3(b)(1), unless the authorization is terminated or revoked sooner.  Performed at [facility], [address] [place], [place] [address]   Blood Culture (routine x 2)     Status: None (Preliminary result)   Collection Time: 03/14/20  7:30 PM   Specimen: BLOOD  Result Value Ref Range   Specimen Description      BLOOD RIGHT ANTECUBITAL Performed at [facility], [address] [place], [place] [address]    Special Requests  BOTTLES DRAWN AEROBIC AND ANAEROBIC Blood Culture adequate  volume Performed at [facility], [address] [place], [place] [address]    Culture      NO GROWTH 1 DAY Performed at [facility], [place] [address], [place], [place] [address]    Report Status PENDING   Lactic acid, plasma     Status: None   Collection Time: 03/14/20 10:07 PM  Result Value Ref Range   Lactic Acid, Venous 1.6 0.5 - 1.9 mmol/L    Comment: Performed at [facility], [address] [place], [place] [address]  Blood Culture (routine x 2)     Status: None (Preliminary result)   Collection Time: 03/15/20 11:57 AM   Specimen: BLOOD  Result Value Ref Range   Specimen Description BLOOD LEFT ANTECUBITAL    Special Requests      BOTTLES DRAWN AEROBIC ONLY Blood Culture adequate volume   Culture      NO GROWTH < 24 HOURS Performed at [facility], [place] [address] [place], [place] [address]    Report Status PENDING   Lactic acid, plasma     Status: None   Collection Time: 03/15/20 11:57 AM  Result Value Ref Range   Lactic Acid, Venous 1.8 0.5 - 1.9 mmol/L    Comment: Performed at [place] [address], [place], [place] [address]  HIV Antibody (routine testing w rflx)     Status: None   Collection Time: 03/15/20 11:57 AM  Result Value Ref Range   HIV Screen 4th Generation wRfx Non Reactive Non Reactive    Comment: Performed at [facility], [place] [address], [place], [place] [address]  Hemoglobin and hematocrit, blood     Status: Abnormal   Collection Time: 03/15/20  2:43 PM  Result Value Ref Range   Hemoglobin 11.6 (L) 12.0 - 15.0 g/dL   HCT 36.6 36.0 - 46.0 %    Comment: Performed at [facility], [place] [address], [place], [place] [address]  Type and screen [place]     Status: None   Collection Time: 03/15/20  2:50 PM  Result Value Ref Range   ABO/RH(D) B POS    Antibody Screen NEG    Sample Expiration      03/18/2020,2359 Performed at [facility], [place] [address]  [place], [place] [address]   ABO/Rh     Status: None   Collection Time: 03/15/20  5:47 PM  Result Value Ref Range   ABO/RH(D)      B POS Performed at [place] [address], [place], [place] [address]   Glucose, capillary     Status: Abnormal   Collection Time: 03/15/20  5:50 PM  Result Value Ref Range   Glucose-Capillary 100 (H) 70 - 99 mg/dL    Comment: Glucose reference range applies only to samples taken after fasting for at least 8 hours.  CBC     Status: Abnormal   Collection Time: 03/16/20  2:17 AM  Result Value Ref Range   WBC 9.2 4.0 - 10.5 K/uL   RBC 3.92 3.87 - 5.11 MIL/uL   Hemoglobin 10.3 (L) 12.0 - 15.0 g/dL   HCT 32.0 (L) 36.0 - 46.0 %   MCV 81.6 80.0 - 100.0 fL   MCH 26.3 26.0 - 34.0 pg   MCHC 32.2 30.0 - 36.0 g/dL   RDW 14.2 11.5 - 15.5 %  Platelets 187 150 - 400 K/uL   nRBC 0.0 0.0 - 0.2 %    Comment: Performed at [facility], [place] [address], [place], [place] [address]  Basic metabolic panel     Status: Abnormal   Collection Time: 03/16/20  2:17 AM  Result Value Ref Range   Sodium 135 135 - 145 mmol/L   Potassium 4.1 3.5 - 5.1 mmol/L   Chloride 104 98 - 111 mmol/L   CO2 21 (L) 22 - 32 mmol/L   Glucose, Bld 127 (H) 70 - 99 mg/dL    Comment: Glucose reference range applies only to samples taken after fasting for at least 8 hours.   BUN 20 6 - 20 mg/dL   Creatinine, Ser 2.00 (H) 0.44 - 1.00 mg/dL   Calcium 7.4 (L) 8.9 - 10.3 mg/dL   GFR, Estimated 31 (L) >60 mL/min    Comment: (NOTE) Calculated using the CKD-EPI Creatinine Equation (2021)    Anion gap 10 5 - 15    Comment: Performed at [place] [address] [place], [place] [address]  Protime-INR     Status: Abnormal   Collection Time: 03/16/20  2:17 AM  Result Value Ref Range   Prothrombin Time 19.4 (H) 11.4 - 15.2 seconds   INR 1.7 (H) 0.8 - 1.2    Comment: (NOTE) INR goal varies based on device and disease states. Performed at [facility], [place] [address],  [place], [place] [address]   Hemoglobin A1c     Status: Abnormal   Collection Time: 03/16/20  2:17 AM  Result Value Ref Range   Hgb A1c MFr Bld 6.2 (H) 4.8 - 5.6 %    Comment: (NOTE) Pre diabetes:          5.7%-6.4%  Diabetes:              >6.4%  Glycemic control for   <7.0% adults with diabetes    Mean Plasma Glucose 131.24 mg/dL    Comment: Performed at [place] [address] [place], [place] [address]  Glucose, capillary     Status: Abnormal   Collection Time: 03/16/20  8:47 AM  Result Value Ref Range   Glucose-Capillary 101 (H) 70 - 99 mg/dL    Comment: Glucose reference range applies only to samples taken after fasting for at least 8 hours.  Glucose, capillary     Status: None   Collection Time: 03/16/20  1:22 PM  Result Value Ref Range   Glucose-Capillary 88 70 - 99 mg/dL    Comment: Glucose reference range applies only to samples taken after fasting for at least 8 hours.  Glucose, capillary     Status: None   Collection Time: 03/16/20  6:16 PM  Result Value Ref Range   Glucose-Capillary 99 70 - 99 mg/dL    Comment: Glucose reference range applies only to samples taken after fasting for at least 8 hours.  Glucose, capillary     Status: Abnormal   Collection Time: 03/16/20  8:30 PM  Result Value Ref Range   Glucose-Capillary 100 (H) 70 - 99 mg/dL    Comment: Glucose reference range applies only to samples taken after fasting for at least 8 hours.  Glucose, capillary     Status: Abnormal   Collection Time: 03/17/20  7:34 AM  Result Value Ref Range   Glucose-Capillary 106 (H) 70 - 99 mg/dL    Comment: Glucose reference range applies only to samples taken  after fasting for at least 8 hours.  Glucose, capillary     Status: Abnormal   Collection Time: 03/17/20  8:38 AM  Result Value Ref Range   Glucose-Capillary 103 (H) 70 - 99 mg/dL    Comment: Glucose reference range applies only to samples taken after fasting for at least 8 hours.  I-STAT, chem 8     Status:  Abnormal   Collection Time: 03/17/20  9:30 AM  Result Value Ref Range   Sodium 141 135 - 145 mmol/L   Potassium 4.0 3.5 - 5.1 mmol/L   Chloride 108 98 - 111 mmol/L   BUN 30 (H) 6 - 20 mg/dL   Creatinine, Ser 2.80 (H) 0.44 - 1.00 mg/dL   Glucose, Bld 113 (H) 70 - 99 mg/dL    Comment: Glucose reference range applies only to samples taken after fasting for at least 8 hours.   Calcium, Ion 0.96 (L) 1.15 - 1.40 mmol/L   TCO2 21 (L) 22 - 32 mmol/L   Hemoglobin 12.2 12.0 - 15.0 g/dL   HCT 36.0 36.0 - 46.0 %    CT PELVIS WO CONTRAST  Result Date: 03/16/2020 CLINICAL DATA:  Development pelvic fluid collections after prior hysterectomy with suspicion of potential abscesses. One of these collections that is located in the posterior pelvis anterior to the rectum is potentially accessible to percutaneous aspiration/drainage. A deeper left-sided collection is not accessible to drainage given bowel loops and bladder anteriorly and major vessels and bony structures posteriorly. The patient presents for imaging prior to planned possible aspiration or catheter drainage of the posterior pelvic fluid collection. EXAM: CT PELVIS WITHOUT CONTRAST TECHNIQUE: Multidetector CT imaging of the pelvis was performed following the standard protocol without intravenous contrast. COMPARISON:  CT of the abdomen and pelvis on 03/14/2020 FINDINGS: Urinary Tract: The bladder is distended with excreted contrast from prior CT. Bowel: Pelvic bowel loops demonstrate some persistent dilatation of small bowel likely consistent with ileus. Component of partial obstruction is not excluded. Vascular/Lymphatic: No enlarged lymph nodes or vascular abnormalities by unenhanced CT. Reproductive:  Status post hysterectomy. Other: The previously noted posterior fluid collection anterior to the rectum is largely dissipated in the prone position likely due to anterior transit of free fluid. No significant discrete focal fluid collection is identified  to allow for percutaneous drainage. The deeper left-sided rounded pelvic collection does appear to remain present in the prone position and measures approximately 4 cm. This is located posterior to the bladder and there is no safe percutaneous window for access to this collection posteriorly due to major iliac vessels as well as the bony pelvis. Musculoskeletal: Unremarkable. IMPRESSION: 1. The previously noted posterior fluid collection anterior to the rectum is largely dissipated in the prone position likely due to anterior transit of free fluid. No significant discrete focal fluid collection is identified to allow for percutaneous drainage. The deeper left-sided rounded collection does appear to remain present in the prone position and measures approximately 4 cm. This is located posterior to the bladder and there is no safe percutaneous window for access to this collection posteriorly due to major iliac vessels as well as the bony pelvis. 2. Persistent dilatation of small bowel likely consistent with ileus. Component of partial obstruction is not excluded. Electronically Signed   By: Aletta Edouard M.D.   On: 03/16/2020 10:50   DG Abd Portable 1V  Result Date: 03/17/2020 CLINICAL DATA:  Abdominal pain EXAM: PORTABLE ABDOMEN - 1 VIEW COMPARISON:  CT abdomen and pelvis March 14, 2020 FINDINGS: There are loops of dilated small bowel without air-fluid levels. No free air. No abnormal calcifications. IMPRESSION: Dilated loops of small bowel raising concern for potential degree of bowel obstruction. Ileus and enteritis are differential considerations. Electronically Signed   By: Lowella Grip III M.D.   On: 03/17/2020 08:11

## 2020-03-17 NOTE — Transfer of Care (Signed)
Immediate Anesthesia Transfer of Care Note  Patient: Katherine Cabrera  Procedure(s) Performed: LAPAROSCOPY DIAGNOSTIC;  ABDOMINAL WASH OUT, PELVIC EXAM UNDER ANESTHESIA, RIGID SIGMOIDOSCOPY (N/A )  Patient Location: PACU  Anesthesia Type:General  Level of Consciousness: drowsy  Airway & Oxygen Therapy: Patient Spontanous Breathing and Patient connected to face mask oxygen  Post-op Assessment: Report given to RN and Post -op Vital signs reviewed and stable  Post vital signs: Reviewed and stable  Last Vitals:  Vitals Value Taken Time  BP 124/81 03/17/20 1205  Temp    Pulse 106 03/17/20 1210  Resp 14 03/17/20 1210  SpO2 100 % 03/17/20 1210  Vitals shown include unvalidated device data.  Last Pain:  Vitals:   03/17/20 0419  TempSrc: Oral  PainSc:       Patients Stated Pain Goal: 0 (03/16/20 2215)  Complications: No complications documented.

## 2020-03-17 NOTE — Plan of Care (Signed)
  Problem: Education: Goal: Knowledge of General Education information will improve Description Including pain rating scale, medication(s)/side effects and non-pharmacologic comfort measures Outcome: Progressing   

## 2020-03-17 NOTE — Plan of Care (Signed)

## 2020-03-17 NOTE — Anesthesia Preprocedure Evaluation (Addendum)
Anesthesia Evaluation  Patient identified by MRN, date of birth, ID band Patient awake    Reviewed: Allergy & Precautions, NPO status , Patient's Chart, lab work & pertinent test results  Airway Mallampati: III  TM Distance: >3 FB Neck ROM: Full    Dental  (+) Dental Advisory Given, Poor Dentition   Pulmonary neg pulmonary ROS,    Pulmonary exam normal breath sounds clear to auscultation       Cardiovascular hypertension,  Rhythm:Regular Rate:Tachycardia     Neuro/Psych negative neurological ROS     GI/Hepatic negative GI ROS, Neg liver ROS,   Endo/Other  diabetes  Renal/GU Renal disease     Musculoskeletal negative musculoskeletal ROS (+)   Abdominal   Peds  Hematology negative hematology ROS (+) anemia ,   Anesthesia Other Findings   Reproductive/Obstetrics                          Anesthesia Physical Anesthesia Plan  ASA: III and emergent  Anesthesia Plan: General   Post-op Pain Management:    Induction: Intravenous, Rapid sequence and Cricoid pressure planned  PONV Risk Score and Plan: 4 or greater and Ondansetron, Dexamethasone, Treatment may vary due to age or medical condition and Midazolam  Airway Management Planned: Oral ETT  Additional Equipment:   Intra-op Plan:   Post-operative Plan: Extubation in OR  Informed Consent: I have reviewed the patients History and Physical, chart, labs and discussed the procedure including the risks, benefits and alternatives for the proposed anesthesia with the patient or authorized representative who has indicated his/her understanding and acceptance.     Dental advisory given  Plan Discussed with: CRNA  Anesthesia Plan Comments:       Anesthesia Quick Evaluation

## 2020-03-17 NOTE — Op Note (Signed)
Operative Note  Katherine Cabrera  [id number]  [id number]  03/17/2020   Surgeon: Lady Deutscher ConnorMD  Assistant: Angelena Form MD  Procedure performed: Diagnostic laparoscopy, abdominal washout and lysis of inflammatory adhesions, drain placement, rigid sigmoidoscopy.  Intraoperative consult by Dr. Macon Large who performed a pelvic exam under anesthesia and instillation of the vaginal vault with methylene blue-see her separate operative note  Preop diagnosis: Peritonitis and sepsis with pelvic abscess and free intra-abdominal fluid, enteritis, approximately 3 months status post robotic hysterectomy/bilateral salpingectomy and umbilical hernia repair with biologic mesh.  Post-op diagnosis/intraop findings: Diffuse purulent peritonitis with organized abscess in the pelvis and inflammatory purulent rind localized to the pelvis, edematous and inflamed appearing left ovary.  No evidence of active vaginal cuff leak on direct inspection nor methylene blue instillation, no evidence of colonic injury or leak on rigid sigmoidoscopy with air leak test  Specimens: No Retained items: 82 French round Blake drain EBL: Minimal cc Complications: none  Description of procedure: After obtaining informed consent the patient was taken to the operating room and placed in dorsolithotomy position on operating room table wheregeneral endotracheal anesthesia was initiated, preoperative antibiotics were administered, SCDs applied, and a formal timeout was performed.  Foley catheter was inserted under direct visualization.  After a pelvic exam under anesthesia by Dr. Macon Large, the abdomen was prepped and draped in usual sterile fashion.  Peritoneal access was gained using an optical entry in the left upper quadrant.  Gross inspection demonstrated no injury from her injury.  The small bowel and stomach are quite dilated diffusely and there is purulent fluid throughout the abdomen with patchy areas of purulent rind and  inflammatory adhesions between loops of bowel in between the bowel and the anterior abdominal wall and pelvic wall.  Additional 5 mm trochars were placed under direct visualization after infiltration with local, and the inflammatory adhesions were gently bluntly dissected until the bowel could be mobilized out of the pelvis.  We encountered a pocket of thick purulent fluid which was evacuated and noted a purulent rind on the pelvic wall.  Continued blunt dissection exposed the vaginal cuff which appeared inflamed but intact.  The left ovary was visible and was quite edematous and appeared encased in purulent fluid.  The right ovary was not visible, there was no specific inflammation in the right lower quadrant or surrounding the cecum or terminal ileum in the region of the appendix.  Dr. Macon Large performed a digital vaginal exam to confirm the anatomy and then instilled the vaginal vault with methylene blue.  There was no appreciable leak.  Dr. Cliffton Asters then broke scrub and performed a rigid sigmoidoscopy, tensely insufflating the rectum and distal sigmoid colon while the pelvis was filled with irrigation.  There was no leak to suggest a perforation although the rectum and sigmoid could not be directly closely inspected due to overlying distended small bowel.  There was copious stool in the rectum which was nonbloody. At this point the abdomen was inspected and all visible purulent fluid was aspirated.  The abdomen was irrigated with 2 L of warm sterile saline.  A 19 French round Blake drain was introduced to the left lateral trocar site and directed down into the pelvis as well as up towards the right colic gutter.  This is secured at the skin with a 2-0 nylon.  The abdomen was then desufflated and the trochars removed.  Skin incisions were closed with subcuticular Monocryl and Dermabond.  The patient was then awakened, extubated and taken to  PACU in stable condition.   All counts were correct at the completion of  the case.

## 2020-03-17 NOTE — Progress Notes (Signed)
Pt had 50-83mls of bile colored emesis. Pts abdomen has been bothering her but no new pain. On call MD A Toniann Fail is made aware. No new orders at this time.

## 2020-03-17 NOTE — Progress Notes (Signed)
[facility] Progress Note     Subjective: CC-  Pain may be a bit worse this morning and she has started to have episodes of emesis.  Received fluid boluses yesterday for tachycardia and borderline blood pressure.  Labs not yet drawn today. Additional history noted-the patient has attempted intercourse twice since her hysterectomy on November 2.  First was in mid December and the last was in early January.  On both occasions she had vaginal bleeding and vaginal/pelvic pain.  This raises concern for cuff leak as etiology of her peritonitis..  Objective: Vital signs in last 24 hours: Temp:  [97.7 F (36.5 C)-100.3 F (37.9 C)] 97.7 F (36.5 C) (01/24 0419) Pulse Rate:  [103-117] 108 (01/24 0419) Resp:  [16-20] 20 (01/24 0419) BP: (119-133)/(80-88) 121/88 (01/24 0419) SpO2:  [94 %-99 %] 99 % (01/24 0419) Last BM Date: 03/16/20  Intake/Output from previous day: 01/23 0701 - 01/24 0700 In: 406.8 [IV Piggyback:406.8] Out: 575 [Urine:500; Emesis/NG output:75] Intake/Output this shift: No intake/output data recorded.  PE: Gen:  Alert, NAD, seems more uncomfortable this morning HEENT: EOM's intact, pupils equal and round Card:  Mild tachy low 100s Pulm:  CTAB, no W/R/R, rate and effort normal Abd: Soft, distended, diffusely tender with guarding Skin: no rashes noted, warm and dry  Lab Results:  Recent Labs    03/14/20 1920 03/15/20 1443 03/16/20 0217  WBC 2.2*  --  9.2  HGB 11.1* 11.6* 10.3*  HCT 33.9* 36.6 32.0*  PLT 242  --  187   BMET Recent Labs    03/14/20 1920 03/16/20 0217  NA 137 135  K 3.6 4.1  CL 102 104  CO2 24 21*  GLUCOSE 126* 127*  BUN 13 20  CREATININE 0.67 2.00*  CALCIUM 8.2* 7.4*   PT/INR Recent Labs    03/16/20 0217  LABPROT 19.4*  INR 1.7*   CMP     Component Value Date/Time   NA 135 03/16/2020 0217   K 4.1 03/16/2020 0217   CL 104 03/16/2020 0217   CO2 21 (L) 03/16/2020 0217   GLUCOSE 127 (H) 03/16/2020 0217   BUN 20  03/16/2020 0217   CREATININE 2.00 (H) 03/16/2020 0217   CALCIUM 7.4 (L) 03/16/2020 0217   PROT 6.5 03/14/2020 1920   ALBUMIN 3.1 (L) 03/14/2020 1920   AST 18 03/14/2020 1920   ALT 16 03/14/2020 1920   ALKPHOS 61 03/14/2020 1920   BILITOT 0.2 (L) 03/14/2020 1920   GFRNONAA 31 (L) 03/16/2020 0217   GFRAA >60 09/26/2014 0850   Lipase     Component Value Date/Time   LIPASE 22 03/14/2020 1920       Studies/Results: CT PELVIS WO CONTRAST  Result Date: 03/16/2020 CLINICAL DATA:  Development pelvic fluid collections after prior hysterectomy with suspicion of potential abscesses. One of these collections that is located in the posterior pelvis anterior to the rectum is potentially accessible to percutaneous aspiration/drainage. A deeper left-sided collection is not accessible to drainage given bowel loops and bladder anteriorly and major vessels and bony structures posteriorly. The patient presents for imaging prior to planned possible aspiration or catheter drainage of the posterior pelvic fluid collection. EXAM: CT PELVIS WITHOUT CONTRAST TECHNIQUE: Multidetector CT imaging of the pelvis was performed following the standard protocol without intravenous contrast. COMPARISON:  CT of the abdomen and pelvis on 03/14/2020 FINDINGS: Urinary Tract: The bladder is distended with excreted contrast from prior CT. Bowel: Pelvic bowel loops demonstrate some persistent dilatation of small bowel likely  consistent with ileus. Component of partial obstruction is not excluded. Vascular/Lymphatic: No enlarged lymph nodes or vascular abnormalities by unenhanced CT. Reproductive:  Status post hysterectomy. Other: The previously noted posterior fluid collection anterior to the rectum is largely dissipated in the prone position likely due to anterior transit of free fluid. No significant discrete focal fluid collection is identified to allow for percutaneous drainage. The deeper left-sided rounded pelvic collection does  appear to remain present in the prone position and measures approximately 4 cm. This is located posterior to the bladder and there is no safe percutaneous window for access to this collection posteriorly due to major iliac vessels as well as the bony pelvis. Musculoskeletal: Unremarkable. IMPRESSION: 1. The previously noted posterior fluid collection anterior to the rectum is largely dissipated in the prone position likely due to anterior transit of free fluid. No significant discrete focal fluid collection is identified to allow for percutaneous drainage. The deeper left-sided rounded collection does appear to remain present in the prone position and measures approximately 4 cm. This is located posterior to the bladder and there is no safe percutaneous window for access to this collection posteriorly due to major iliac vessels as well as the bony pelvis. 2. Persistent dilatation of small bowel likely consistent with ileus. Component of partial obstruction is not excluded. Electronically Signed   By: Irish Lack M.D.   On: 03/16/2020 10:50    Anti-infectives: Anti-infectives (From admission, onward)   Start     Dose/Rate Route Frequency Ordered Stop   03/16/20 2300  vancomycin (VANCOCIN) IVPB 1000 mg/200 mL premix        1,000 mg 200 mL/hr over 60 Minutes Intravenous Every 24 hours 03/16/20 1347     03/15/20 2300  vancomycin (VANCOCIN) IVPB 1000 mg/200 mL premix  Status:  Discontinued        1,000 mg 200 mL/hr over 60 Minutes Intravenous Every 12 hours 03/15/20 1028 03/16/20 1347   03/15/20 0830  vancomycin (VANCOCIN) IVPB 1000 mg/200 mL premix       "Followed by" Linked Group Details   1,000 mg 200 mL/hr over 60 Minutes Intravenous  Once 03/15/20 0722 03/15/20 0940   03/15/20 0800  piperacillin-tazobactam (ZOSYN) IVPB 3.375 g        3.375 g 12.5 mL/hr over 240 Minutes Intravenous Every 8 hours 03/15/20 0722     03/15/20 0730  vancomycin (VANCOCIN) IVPB 1000 mg/200 mL premix       "Followed  by" Linked Group Details   1,000 mg 200 mL/hr over 60 Minutes Intravenous  Once 03/15/20 0722 03/15/20 0835   03/14/20 2045  piperacillin-tazobactam (ZOSYN) IVPB 3.375 g        3.375 g 100 mL/hr over 30 Minutes Intravenous  Once 03/14/20 2032 03/14/20 2113       Assessment/Plan DM HLD 3 months s/p uncomplicated robotic hysterectomy and bilateral salpingectomy and umbilical hernia repair with biologic mesh 12/25/2019 Covid+ 03/01/20 AKI - Cr up 2 from 0.67-this morning's labs have not been drawn.  Sepsis Enteritis with free fluid and pelvic abscesses, etiology unclear -Fluid dissipated anteriorly and remaining pelvic collection was not accessible and therefore no interventional radiology drain could be placed yesterday - keep NPO - continue broad spectrum IV antibiotics -Continues to have pain, tender exam, tachycardia and now with emesis despite several days of bowel rest and broad-spectrum antibiotics.  -Given history of bleeding and pain with intercourse after hysterectomy I am concerned that she could have a small vaginal cuff leak which could  certainly explain the unusual timeline of events following her recent surgery -I recommend diagnostic laparoscopy to evaluate the bowel and confirm there is no occult injury or ischemia, washout intra-abdominal fluid and potentially drain placement.  I discussed with her that this may require conversion to open surgery depending on intraoperative findings.  I have spoken with the on-call gynecologist and requested that they consult intraoperatively to perform a pelvic exam under anesthesia to evaluate the vaginal cuff.   ID - zosyn/vancomycin FEN - IVF, NPO VTE - SCDs, lovenox Foley - none   LOS: 2 days    Berna Bue, MD [facility] 03/17/2020, 7:46 AM Please see Amion for pager number during day hours 7:00am-4:30pm

## 2020-03-17 NOTE — Progress Notes (Signed)
PROGRESS NOTE  Katherine Cabrera DOB: [birth year] DOA: 03/14/2020 PCP: Patient, No Pcp Per  Brief History   Patient is a 45 y.o. female with PMHx of HTN, HLD, DM-2, morbid obesity-recent history of hysterectomy/bilateral salpingo-oophorectomy/umbilical hernia repair in November 2021-at [facility] hospital-presenting with worsening abdominal pain-upon imaging studies-found to have pelvic abscess.  The patient was taken to the OR this morning for diagnostic laparoscopy, abdominal washout and lysis of inflammatory adhesions, drain placement, rigid sigmoidoscopy.  Intraoperative consult by Dr. Macon Large who performed a pelvic exam under anesthesia and instillation of the vaginal vault with methylene blue.  She is receiving IV Zyvox and zosyn.   The patient has tolerated the procedure well.   Consultants  . OB/Gyn . General Surgery . Interventional Radiology  Procedures  . Placement of abscess drain in pelvis by IR on 03/15/2020 . Diagnostic laparoscopy, abdominal washout and lysis of inflammatory adhesions, drain placement, rigid sigmoidoscopy.  Intraoperative consult by Dr. Macon Large who performed a pelvic exam under anesthesia and instillation of the vaginal vault with methylene blue.  Antibiotics   Anti-infectives (From admission, onward)   Start     Dose/Rate Route Frequency Ordered Stop   03/17/20 2000  linezolid (ZYVOX) IVPB 600 mg        600 mg 300 mL/hr over 60 Minutes Intravenous Every 12 hours 03/17/20 1437     03/17/20 1445  linezolid (ZYVOX) IVPB 600 mg  Status:  Discontinued        600 mg 300 mL/hr over 60 Minutes Intravenous Every 12 hours 03/17/20 1350 03/17/20 1437   03/17/20 1445  piperacillin-tazobactam (ZOSYN) IVPB 3.375 g        3.375 g 12.5 mL/hr over 240 Minutes Intravenous Every 8 hours 03/17/20 1437     03/16/20 2300  vancomycin (VANCOCIN) IVPB 1000 mg/200 mL premix  Status:  Discontinued        1,000 mg 200 mL/hr over 60 Minutes Intravenous  Every 24 hours 03/16/20 1347 03/17/20 1325   03/15/20 2300  vancomycin (VANCOCIN) IVPB 1000 mg/200 mL premix  Status:  Discontinued        1,000 mg 200 mL/hr over 60 Minutes Intravenous Every 12 hours 03/15/20 1028 03/16/20 1347   03/15/20 0830  vancomycin (VANCOCIN) IVPB 1000 mg/200 mL premix       "Followed by" Linked Group Details   1,000 mg 200 mL/hr over 60 Minutes Intravenous  Once 03/15/20 0722 03/15/20 0940   03/15/20 0800  piperacillin-tazobactam (ZOSYN) IVPB 3.375 g  Status:  Discontinued        3.375 g 12.5 mL/hr over 240 Minutes Intravenous Every 8 hours 03/15/20 0722 03/17/20 1437   03/15/20 0730  vancomycin (VANCOCIN) IVPB 1000 mg/200 mL premix       "Followed by" Linked Group Details   1,000 mg 200 mL/hr over 60 Minutes Intravenous  Once 03/15/20 0722 03/15/20 0835   03/14/20 2045  piperacillin-tazobactam (ZOSYN) IVPB 3.375 g        3.375 g 100 mL/hr over 30 Minutes Intravenous  Once 03/14/20 2032 03/14/20 2113    .  Subjective  The patient is resting comfortably. No new complaints.  Objective   Vitals:  Vitals:   03/17/20 1313 03/17/20 1525  BP: 135/75 128/85  Pulse: 96 100  Resp: 15 20  Temp: 98 F (36.7 C) 98.1 F (36.7 C)  SpO2: 94% 100%   Exam:  Constitutional:  . The patient is awake, alert, and oriented x 3. No acute distress. Respiratory:  .  No increased work of breathing. . No wheezes, rales, or rhonchi . No tactile fremitus Cardiovascular:  . Regular rate and rhythm . No murmurs, ectopy, or gallups. . No lateral PMI. No thrills. Abdomen:  . Abdomen is soft, non-tender, non-distended . No hernias, masses, or organomegaly . Normoactive bowel sounds.  Musculoskeletal:  . No cyanosis, clubbing, or edema Skin:  . No rashes, lesions, ulcers . palpation of skin: no induration or nodules Neurologic:  . CN 2-12 intact . Sensation all 4 extremities intact Psychiatric:  . Mental status o Mood, affect appropriate o Orientation to person,  place, time  . judgment and insight appear intact  I have personally reviewed the following:   Today's Data  . Vitals, BMP, EKG, Glucoses  Micro Data  . Blood culture: No growth  Imaging  . CT Pelvis  Cardiology Data  . EKG  Scheduled Meds: . atorvastatin  40 mg Oral QHS  . Chlorhexidine Gluconate Cloth  6 each Topical Daily  . [START ON 03/18/2020] enoxaparin (LOVENOX) injection  30 mg Subcutaneous Q24H  . insulin aspart  0-6 Units Subcutaneous TID WC  . midodrine  5 mg Oral TID WC   Continuous Infusions: . sodium chloride 75 mL/hr at 03/17/20 1457  . famotidine (PEPCID) IV 20 mg (03/16/20 1720)  . linezolid (ZYVOX) IV    . piperacillin-tazobactam (ZOSYN)  IV 3.375 g (03/17/20 1456)    Principal Problem:   Sepsis (HCC) Active Problems:   Pelvic abscess in female   Transient hypotension   Enteritis   COVID-19 virus infection   Normocytic anemia   Type 2 diabetes mellitus with hyperlipidemia (HCC)   Intraabdominal fluid collection   S/P hysterectomy   LOS: 2 days   A & P  The patient was taken to the OR this morning for diagnostic laparoscopy, abdominal washout and lysis of inflammatory adhesions, drain placement, rigid sigmoidoscopy.  Intraoperative consult by Dr. Macon Large who performed a pelvic exam under anesthesia and instillation of the vaginal vault with methylene blue. Sepsis physiology improving-she does not have a toxic appearance-but blood pressure soft-continue IV fluids-we will add low-dose midodrine-remains on broad-spectrum antibiotics.  General surgery/IR following with plans for CT-guided pelvic drain placement on 03/15/2020. The patient was taken to the OR this morning for diagnostic laparoscopy, abdominal washout and lysis of inflammatory adhesions, drain placement, rigid sigmoidoscopy.  Intraoperative consult by Dr. Macon Large who performed a pelvic exam under anesthesia and instillation of the vaginal vault with methylene blue. The patient is receiving IV  Zosyn and Zyvox currently.   AKI: Creatinine is increased from 2.00 to 2.80. BP is better, so will stop midodrine and increase IV fluids. Avoid nephrotoxic medications and hypotension. Monitor creatinine, electrolytes, and volume status.  Normocytic anemia: Due to acute illness and IV fluid dilution.  Follow CBC  HTN: The patient is normotensive without antihypertensives or midodrine. Monitor.  HLD: Continue statin  DM-2 (A1c 6.2 on 1/23): CBG stable-on SSI-oral hypoglycemic agents remain on hold,   Recent history of COVID-19 infection: Diagnosed on 12/31-see report below.  Asymptomatic currently-does not require isolation.  Morbid Obesity:Estimated body mass index is 37.55 kg/m as calculated from the following:   Height as of this encounter: 5\' 3"  (1.6 m).   Weight as of this encounter: 96.2 kg.   I have seen this patient myself. I have spent 36 minutes in her evaluation and care.  DVT Prophylaxis: Lovenox (held prior to surgery) CODE STATUS: Full Code Family Communication: None available Disposition: Status is: Inpatient  Remains inpatient appropriate because:IV treatments appropriate due to intensity of illness or inability to take PO   Dispo: The patient is from: Home              Anticipated d/c is to: Home              Anticipated d/c date is: 3 days              Patient currently is not medically stable to d/c.   Difficult to place patient No  Kimla Furth, DO [facility] Direct contact: see [email or web address]  7PM-7AM contact night coverage as above 03/17/2020, 3:51 PM  LOS: 2 days

## 2020-03-17 NOTE — Progress Notes (Signed)
Pharmacy Antibiotic Note  Katherine Cabrera is a 45 y.o. female admitted on 03/14/2020 with Diffuse purulent peritonitis with organized abscess in the pelvis .  Pharmacy has been consulted for Zosyn dosing.  Height: 5\' 3"  (160 cm) Weight: 96.2 kg (212 lb) IBW/kg (Calculated) : 52.4  Temp (24hrs), Avg:98.4 F (36.9 C), Min:97.1 F (36.2 C), Max:100.3 F (37.9 C)  Recent Labs  Lab 03/14/20 1920 03/14/20 2207 03/15/20 1157 03/16/20 0217 03/17/20 0930  WBC 2.2*  --   --  9.2  --   CREATININE 0.67  --   --  2.00* 2.80*  LATICACIDVEN  --  1.6 1.8  --   --     Estimated Creatinine Clearance: 28.3 mL/min (A) (by C-G formula based on SCr of 2.8 mg/dL (H)).    No Known Allergies  Antimicrobials this admission: 1/21 Zosyn >>  1/22 Vancomycin >>   Dose adjustments this admission: Vancomycin adjustment   Microbiology results: Pending   Plan:  - Patient has Diffuse purulent peritonitis with organized abscess in the pelvis originally on zosyn/vanco - Patient has has significant increase in Scr since admission 0.67 (1/21) to 2.8 (1/24)  - Continue Zosyn 3.375g IV q8h infused over 4 hours  - Recommended stopping vancomycin based on Scr increase and transition to Zyvox at this time  - Monitor patient's renal function and urine output  Thank you for allowing pharmacy to be a part of this patient's care.  01-02-1976 PharmD. BCPS  03/17/2020 1:51 PM

## 2020-03-17 NOTE — Op Note (Signed)
Alvy Beal Bozza PROCEDURE DATE: 03/14/2020 - 03/17/2020  PREOPERATIVE DIAGNOSES:  Pelvic abscess s/p RATH in 12/2019, sepsis, enteritis POSTOPERATIVE DIAGNOSES: The same PROCEDURE: Pelvic exam under anesthesia SURGEON:  Dr. Jaynie Collins  INDICATIONS: Katherine Cabrera is a 45 y.o. female with history of DM, HTN s/p robotic hysterectomy and bilateral salpingectomy and umbilical hernia repair with biologic mesh 12/25/2019 at [facility], admitted with sepsis, enteritis with free fluid and pelvic abscesses, etiology unclear. Our service is being consulted for intraoperative exam under anesthesia, there is a concern about vaginal cuff perforation. Written informed consent was obtained.    FINDINGS: Very erythematous vaginal cuff, but overall intact. No perforation noted.  No extravasation of methylene blue solution into peritoneal cavity when solution injected into vagina.  Microperforation cannot be totally excluded.  ANESTHESIA:    General ESTIMATED BLOOD LOSS: 0 ml (from this portion of procedure) SPECIMENS: None COMPLICATIONS: None immediate  PROCEDURE IN DETAIL:  The patient received intravenous antibiotics and had sequential compression devices applied to her lower extremities while in the preoperative area.  She was then taken to the operating room where general anesthesia was administered and was found to be adequate.  She was placed in the dorsal lithotomy/supine position, and was prepped and draped in a sterile manner.  A Foley catheter was inserted into her bladder and attached to constant drainage. After an adequate timeout was performed, attention was turned to pelvis where a speculum was placed in the vagina. The vaginal cuff was carefully examined, and the aforementioned findings were noted.  Dr. Fredricka Bonine [facility]) then proceeded with the laparoscopy procedure, for details refer to their operative report. No dehiscence was noted on laparoscopic exam but there was copious exudate around  the healed vaginal cuff, and this led to adhesions of bowel to this area.  The adhesions were lysed by Dr. Fredricka Bonine.  Under her visualization from above, a solution of methylene blue (10 ml in 50 ml of NS) was used the fill the vagina. No intraperitoneal leak was observed, but microperforation cannot be totally ruled out.  No other visceral injury was noted. No suturing or other intervention needed.   At this point, I scrubbed out of the procedure and Dr. Fredricka Bonine proceeded with the rest of the laparoscopic evaluation.  PLAN: Pelvic rest for at least eight more weeks.  If there is a microperforation, this will heal by secondary intention as the sepsis/enteritis improves.  Patient needs to follow up with primary GYN surgeon before being cleared to come off pelvic rest. Routine postoperative care.    We will be available for any GYN concerns/questions during her hospitalization.  Please call [phone] [facility] OB/GYN Consult Attending Monday-Friday 8am - 5pm) or [phone] [facility] OB/GYN Attending On Call all day, every day) for any gynecologic concerns at any time.    Jaynie Collins, MD, FACOG Obstetrician & Gynecologist, [facility] [employer], [facility]

## 2020-03-17 NOTE — Anesthesia Procedure Notes (Signed)
Procedure Name: Intubation Date/Time: 03/17/2020 10:05 AM Performed by: Quentin Ore, CRNA Pre-anesthesia Checklist: Patient identified, Emergency Drugs available, Suction available and Patient being monitored Patient Re-evaluated:Patient Re-evaluated prior to induction Oxygen Delivery Method: Circle system utilized Preoxygenation: Pre-oxygenation with 100% oxygen Induction Type: IV induction, Rapid sequence and Cricoid Pressure applied Laryngoscope Size: Glidescope and 3 Tube type: Oral Tube size: 7.0 mm Number of attempts: 1 Airway Equipment and Method: Stylet,  Oral airway and Video-laryngoscopy Placement Confirmation: ETT inserted through vocal cords under direct vision,  positive ETCO2 and breath sounds checked- equal and bilateral Secured at: 21 cm Tube secured with: Tape Dental Injury: Teeth and Oropharynx as per pre-operative assessment

## 2020-03-18 ENCOUNTER — Encounter (HOSPITAL_COMMUNITY): Payer: Self-pay | Admitting: Surgery

## 2020-03-18 DIAGNOSIS — D709 Neutropenia, unspecified: Secondary | ICD-10-CM | POA: Diagnosis not present

## 2020-03-18 DIAGNOSIS — R188 Other ascites: Secondary | ICD-10-CM | POA: Diagnosis not present

## 2020-03-18 DIAGNOSIS — D708 Other neutropenia: Secondary | ICD-10-CM | POA: Diagnosis not present

## 2020-03-18 DIAGNOSIS — N739 Female pelvic inflammatory disease, unspecified: Secondary | ICD-10-CM | POA: Diagnosis not present

## 2020-03-18 LAB — BLOOD CULTURE ID PANEL (REFLEXED) - BCID2

## 2020-03-18 LAB — CBC
HCT: 32.8 % — ABNORMAL LOW (ref 36.0–46.0)
Hemoglobin: 11.2 g/dL — ABNORMAL LOW (ref 12.0–15.0)
MCH: 27.1 pg (ref 26.0–34.0)
MCHC: 34.1 g/dL (ref 30.0–36.0)
MCV: 79.4 fL — ABNORMAL LOW (ref 80.0–100.0)
Platelets: 132 10*3/uL — ABNORMAL LOW (ref 150–400)
RBC: 4.13 MIL/uL (ref 3.87–5.11)
RDW: 14.6 % (ref 11.5–15.5)
WBC: 12 10*3/uL — ABNORMAL HIGH (ref 4.0–10.5)
nRBC: 0 % (ref 0.0–0.2)

## 2020-03-18 LAB — COMPREHENSIVE METABOLIC PANEL
ALT: 22 U/L (ref 0–44)
AST: 35 U/L (ref 15–41)
Albumin: 1.8 g/dL — ABNORMAL LOW (ref 3.5–5.0)
Alkaline Phosphatase: 67 U/L (ref 38–126)
Anion gap: 13 (ref 5–15)
BUN: 39 mg/dL — ABNORMAL HIGH (ref 6–20)
CO2: 16 mmol/L — ABNORMAL LOW (ref 22–32)
Calcium: 6.6 mg/dL — ABNORMAL LOW (ref 8.9–10.3)
Chloride: 109 mmol/L (ref 98–111)
Creatinine, Ser: 3.05 mg/dL — ABNORMAL HIGH (ref 0.44–1.00)
GFR, Estimated: 19 mL/min — ABNORMAL LOW (ref >60)
Glucose, Bld: 178 mg/dL — ABNORMAL HIGH (ref 70–99)
Potassium: 4 mmol/L (ref 3.5–5.1)
Sodium: 138 mmol/L (ref 135–145)
Total Bilirubin: 1.1 mg/dL (ref 0.3–1.2)
Total Protein: 5.3 g/dL — ABNORMAL LOW (ref 6.5–8.1)

## 2020-03-18 LAB — GLUCOSE, CAPILLARY
Glucose-Capillary: 144 mg/dL — ABNORMAL HIGH (ref 70–99)
Glucose-Capillary: 166 mg/dL — ABNORMAL HIGH (ref 70–99)
Glucose-Capillary: 170 mg/dL — ABNORMAL HIGH (ref 70–99)
Glucose-Capillary: 158 mg/dL — ABNORMAL HIGH (ref 70–99)

## 2020-03-18 MED ORDER — INFLUENZA VAC SPLIT QUAD 0.5 ML IM SUSY
0.5000 mL | PREFILLED_SYRINGE | INTRAMUSCULAR | Status: AC
  Administered 2020-03-22: 0.5 mL via INTRAMUSCULAR
  Filled 2020-03-18: qty 0.5

## 2020-03-18 MED ORDER — SODIUM CHLORIDE 0.9 % IV SOLN
100.0000 mg | Freq: Two times a day (BID) | INTRAVENOUS | Status: DC
  Administered 2020-03-18 – 2020-03-21 (×7): 100 mg via INTRAVENOUS
  Filled 2020-03-18 (×7): qty 100

## 2020-03-18 MED ORDER — SODIUM CHLORIDE 0.9 % IV SOLN
3.0000 g | Freq: Two times a day (BID) | INTRAVENOUS | Status: DC
  Administered 2020-03-18 – 2020-03-21 (×6): 3 g via INTRAVENOUS
  Filled 2020-03-18 (×3): qty 8
  Filled 2020-03-18: qty 0.07
  Filled 2020-03-18 (×3): qty 3

## 2020-03-18 NOTE — Progress Notes (Signed)
[facility] Progress Note  1 Day Post-Op  Subjective: CC-  Pain somewhat better, now more concerned with NG tube discomfort  Objective: Vital signs in last 24 hours: Temp:  [97.1 F (36.2 C)-98.2 F (36.8 C)] 98.2 F (36.8 C) (01/25 0414) Pulse Rate:  [92-108] 97 (01/25 0414) Resp:  [15-20] 20 (01/24 2302) BP: (103-135)/(68-85) 109/77 (01/25 0414) SpO2:  [94 %-100 %] 100 % (01/25 0414) Weight:  [96.2 kg] 96.2 kg (01/24 0926) Last BM Date: 03/16/20  Intake/Output from previous day: 01/24 0701 - 01/25 0700 In: 2975.7 [I.V.:2643.7; IV Piggyback:282] Out: 2225 [Urine:775; Emesis/NG output:520; Drains:280; Blood:150] Intake/Output this shift: No intake/output data recorded.  PE: Gen:  Alert, NAD, seems more comfortable this morning HEENT: EOM's intact, pupils equal and round Card:  Regular, 90s Pulm:  CTAB, no W/R/R, rate and effort normal Abd: Soft, distended, diffusely tender. Incisions c/d/i. JP output serosanguinous. NG output bilious. Urine output pale yellow and brisk in foley bag Skin: no rashes noted, warm and dry  Lab Results:  Recent Labs    03/16/20 0217 03/17/20 0930 03/18/20 0425  WBC 9.2  --  12.0*  HGB 10.3* 12.2 11.2*  HCT 32.0* 36.0 32.8*  PLT 187  --  132*   BMET Recent Labs    03/16/20 0217 03/17/20 0930 03/18/20 0425  NA 135 141 138  K 4.1 4.0 4.0  CL 104 108 109  CO2 21*  --  16*  GLUCOSE 127* 113* 178*  BUN 20 30* 39*  CREATININE 2.00* 2.80* 3.05*  CALCIUM 7.4*  --  6.6*   PT/INR Recent Labs    03/16/20 0217  LABPROT 19.4*  INR 1.7*   CMP     Component Value Date/Time   NA 138 03/18/2020 0425   K 4.0 03/18/2020 0425   CL 109 03/18/2020 0425   CO2 16 (L) 03/18/2020 0425   GLUCOSE 178 (H) 03/18/2020 0425   BUN 39 (H) 03/18/2020 0425   CREATININE 3.05 (H) 03/18/2020 0425   CALCIUM 6.6 (L) 03/18/2020 0425   PROT 5.3 (L) 03/18/2020 0425   ALBUMIN 1.8 (L) 03/18/2020 0425   AST 35 03/18/2020 0425   ALT 22  03/18/2020 0425   ALKPHOS 67 03/18/2020 0425   BILITOT 1.1 03/18/2020 0425   GFRNONAA 19 (L) 03/18/2020 0425   GFRAA >60 09/26/2014 0850   Lipase     Component Value Date/Time   LIPASE 22 03/14/2020 1920       Studies/Results: CT PELVIS WO CONTRAST  Result Date: 03/16/2020 CLINICAL DATA:  Development pelvic fluid collections after prior hysterectomy with suspicion of potential abscesses. One of these collections that is located in the posterior pelvis anterior to the rectum is potentially accessible to percutaneous aspiration/drainage. A deeper left-sided collection is not accessible to drainage given bowel loops and bladder anteriorly and major vessels and bony structures posteriorly. The patient presents for imaging prior to planned possible aspiration or catheter drainage of the posterior pelvic fluid collection. EXAM: CT PELVIS WITHOUT CONTRAST TECHNIQUE: Multidetector CT imaging of the pelvis was performed following the standard protocol without intravenous contrast. COMPARISON:  CT of the abdomen and pelvis on 03/14/2020 FINDINGS: Urinary Tract: The bladder is distended with excreted contrast from prior CT. Bowel: Pelvic bowel loops demonstrate some persistent dilatation of small bowel likely consistent with ileus. Component of partial obstruction is not excluded. Vascular/Lymphatic: No enlarged lymph nodes or vascular abnormalities by unenhanced CT. Reproductive:  Status post hysterectomy. Other: The previously noted posterior fluid collection anterior  to the rectum is largely dissipated in the prone position likely due to anterior transit of free fluid. No significant discrete focal fluid collection is identified to allow for percutaneous drainage. The deeper left-sided rounded pelvic collection does appear to remain present in the prone position and measures approximately 4 cm. This is located posterior to the bladder and there is no safe percutaneous window for access to this collection  posteriorly due to major iliac vessels as well as the bony pelvis. Musculoskeletal: Unremarkable. IMPRESSION: 1. The previously noted posterior fluid collection anterior to the rectum is largely dissipated in the prone position likely due to anterior transit of free fluid. No significant discrete focal fluid collection is identified to allow for percutaneous drainage. The deeper left-sided rounded collection does appear to remain present in the prone position and measures approximately 4 cm. This is located posterior to the bladder and there is no safe percutaneous window for access to this collection posteriorly due to major iliac vessels as well as the bony pelvis. 2. Persistent dilatation of small bowel likely consistent with ileus. Component of partial obstruction is not excluded. Electronically Signed   By: Irish Lack M.D.   On: 03/16/2020 10:50   DG Abd Portable 1V  Result Date: 03/17/2020 CLINICAL DATA:  Abdominal pain EXAM: PORTABLE ABDOMEN - 1 VIEW COMPARISON:  CT abdomen and pelvis March 14, 2020 FINDINGS: There are loops of dilated small bowel without air-fluid levels. No free air. No abnormal calcifications. IMPRESSION: Dilated loops of small bowel raising concern for potential degree of bowel obstruction. Ileus and enteritis are differential considerations. Electronically Signed   By: Bretta Bang III M.D.   On: 03/17/2020 08:11    Anti-infectives: Anti-infectives (From admission, onward)   Start     Dose/Rate Route Frequency Ordered Stop   03/17/20 2000  linezolid (ZYVOX) IVPB 600 mg        600 mg 300 mL/hr over 60 Minutes Intravenous Every 12 hours 03/17/20 1437     03/17/20 1445  linezolid (ZYVOX) IVPB 600 mg  Status:  Discontinued        600 mg 300 mL/hr over 60 Minutes Intravenous Every 12 hours 03/17/20 1350 03/17/20 1437   03/17/20 1445  piperacillin-tazobactam (ZOSYN) IVPB 3.375 g        3.375 g 12.5 mL/hr over 240 Minutes Intravenous Every 8 hours 03/17/20 1437      03/16/20 2300  vancomycin (VANCOCIN) IVPB 1000 mg/200 mL premix  Status:  Discontinued        1,000 mg 200 mL/hr over 60 Minutes Intravenous Every 24 hours 03/16/20 1347 03/17/20 1325   03/15/20 2300  vancomycin (VANCOCIN) IVPB 1000 mg/200 mL premix  Status:  Discontinued        1,000 mg 200 mL/hr over 60 Minutes Intravenous Every 12 hours 03/15/20 1028 03/16/20 1347   03/15/20 0830  vancomycin (VANCOCIN) IVPB 1000 mg/200 mL premix       "Followed by" Linked Group Details   1,000 mg 200 mL/hr over 60 Minutes Intravenous  Once 03/15/20 0722 03/15/20 0940   03/15/20 0800  piperacillin-tazobactam (ZOSYN) IVPB 3.375 g  Status:  Discontinued        3.375 g 12.5 mL/hr over 240 Minutes Intravenous Every 8 hours 03/15/20 0722 03/17/20 1437   03/15/20 0730  vancomycin (VANCOCIN) IVPB 1000 mg/200 mL premix       "Followed by" Linked Group Details   1,000 mg 200 mL/hr over 60 Minutes Intravenous  Once 03/15/20 0722 03/15/20 0835  03/14/20 2045  piperacillin-tazobactam (ZOSYN) IVPB 3.375 g        3.375 g 100 mL/hr over 30 Minutes Intravenous  Once 03/14/20 2032 03/14/20 2113       Assessment/Plan DM HLD 3 months s/p uncomplicated robotic hysterectomy and bilateral salpingectomy and umbilical hernia repair with biologic mesh 12/25/2019 Covid+ 03/01/20 AKI - Cr up now 3 from baseline 0.67- BUN 39, concern for intrinsic renal on top of prerenal  Sepsis Enteritis with free fluid and pelvic abscesses, etiology unclear -Fluid dissipated anteriorly and remaining pelvic collection was not accessible and therefore no interventional radiology drain could be placed 1/22 - keep NPO - continue broad spectrum IV antibiotics -Continues to have pain, tender exam, tachycardia and now with emesis despite several days of bowel rest and broad-spectrum antibiotics.  -Given history of bleeding and pain with intercourse after hysterectomy I am concerned that she could have a small vaginal cuff leak which could  certainly explain the unusual timeline of events following her recent surgery -s/p dx lap, abdominal washout and drain placement. Could not elicit vaginal cuff leak but abscess in pelvis suggestive of the same. No evidence of bowel injury.   ID - zosyn/vancomycin FEN - IVF, NPO VTE - SCDs, lovenox Foley - continue  Continue NG, JP, foley for now. Mobilize. Continue fluid resus. Consider nephrology c/s if creatinine does not improve by tomorrow.    LOS: 3 days    Berna Bue, MD [facility] Surgery 03/18/2020, 7:40 AM Please see Amion for pager number during day hours 7:00am-4:30pm

## 2020-03-18 NOTE — Progress Notes (Signed)
Pharmacy Antibiotic Note  Katherine Cabrera is a 45 y.o. female admitted on 03/14/2020 with chief complaint of chills and abdominal pain. Patient recently underwent hysterectomy in November 2021 at [facility] and found to have enhancing fluid collections on admission. She underwent abdominal washout with drain placement 1/24; unfortunately no OR cultures were drawn. Blood cultures from [facility] now positive for GPC in chains in the anaerobic bottle (only 1 set drawn) - likely anaerobic given timing and lack of ID on BCID. ID now following; deescalating linezolid and piperacillin/tazobactam to ampicillin/sulbactam and doxycycline for empiric pelvic/abdominal organism coverage given recent surgery.  Pharmacy has been consulted for Unasyn dosing.  Patient currently with AKI; Scr 3.05 this AM with CrCl ~26 ml/min. Will start patient on Unasyn 3g q12hr and follow closely for dose adjustments. Will follow along for patient's ability to take oral medications and hopefully switch to oral doxycycline as appropriate.  Plan: Discontinue pip/tazo and linezolid Initiate amp/sulb IV 3g q12hr and doxycycline 100mg  IV BID Follow blood cultures, further workup, and ID/surgery recommendations  Height: 5\' 3"  (160 cm) Weight: 96.2 kg (212 lb) IBW/kg (Calculated) : 52.4  Temp (24hrs), Avg:98 F (36.7 C), Min:97.1 F (36.2 C), Max:98.2 F (36.8 C)  Recent Labs  Lab 03/14/20 1920 03/14/20 2207 03/15/20 1157 03/16/20 0217 03/17/20 0930 03/18/20 0425  WBC 2.2*  --   --  9.2  --  12.0*  CREATININE 0.67  --   --  2.00* 2.80* 3.05*  LATICACIDVEN  --  1.6 1.8  --   --   --     Estimated Creatinine Clearance: 26 mL/min (A) (by C-G formula based on SCr of 3.05 mg/dL (H)).    No Known Allergies  Antimicrobials this admission:  Zosyn 1/22>>1/25 Vanc 1/22>> 1/24 Zyvox 1/24 >>1/25 Unasyn 1/25>> Doxy 1/25>>  Microbiology results: 1/21 Bcx GPC in chains (anaerobic bottle only) - no ID on BCID  Thank you for  allowing pharmacy to be a part of this patient's care.  2/24, PharmD PGY2 ID Pharmacy Resident [phone]  03/18/2020 12:04 PM

## 2020-03-18 NOTE — Anesthesia Postprocedure Evaluation (Signed)
Anesthesia Post Note  Patient: Katherine Cabrera  Procedure(s) Performed: LAPAROSCOPY DIAGNOSTIC;  ABDOMINAL WASH OUT, PELVIC EXAM UNDER ANESTHESIA, RIGID SIGMOIDOSCOPY (N/A )     Patient location during evaluation: PACU Anesthesia Type: General Level of consciousness: sedated and patient cooperative Pain management: pain level controlled Vital Signs Assessment: post-procedure vital signs reviewed and stable Respiratory status: spontaneous breathing Cardiovascular status: stable Anesthetic complications: no   No complications documented.  Last Vitals:  Vitals:   03/17/20 2302 03/18/20 0414  BP: 103/68 109/77  Pulse: 96 97  Resp: 20   Temp: 36.7 C 36.8 C  SpO2: 100% 100%    Last Pain:  Vitals:   03/18/20 0844  TempSrc:   PainSc: 4                  Lewie Loron

## 2020-03-18 NOTE — Progress Notes (Signed)
Ambulated patient approximately 150 ft around the unit with rolling walker. Patient tolerated activity well, and stated it felt good to be up and moving around. Patient returned to bedside recliner. NG returned to low int. Suction. Will continue to monitor.

## 2020-03-18 NOTE — Progress Notes (Signed)
Assisted patient OOB x1 assist to bedside recliner. Patient tolerated activity well. Patient current has NG to low int. Suction, left sided JP drain and foley catheter in place. VS currently stable. Patient states she is more comfortable OOB. Will continue to monitor.  *Family called and pt gave update.

## 2020-03-18 NOTE — Progress Notes (Addendum)
PHARMACY - PHYSICIAN COMMUNICATION CRITICAL VALUE ALERT - BLOOD CULTURE IDENTIFICATION (BCID)  Katherine Cabrera is an 45 y.o. female who presented to [facility] on 03/14/2020 with a chief complaint of chills and abdominal pain. Patient recently underwent hysterectomy and salpingo-oophorectomy in November 2021. In ED, CT abdomen/pelvis demonstrated enhancing fluid collections within pelvis and fluid-filled loops in the small bowel. Case was discussed with patient's gynecologist (Dr. Janae Sauce at [facility]) who thought this was not a post-operative complication. Patient underwent abdominal washout with drain placement here on 1/24.  Patient's initial blood cultures from [facility] on 1/21 are now positive for GPCs in chains in 1/2 bottles (only 1 set drawn) - only in the anaerobic bottle. BCID is negative for any organism. Based on lack of ID on BCID and timing of positive culture, likely anaerobic organism. Discussed with ID who plans to see patient today. Currently on pip/tazo and linezolid - this combination should likely cover unidentified organism in bcx.  Assessment: Bcx 1/2 bottles + GPC in chains - BCID did not detect organism  Name of physician (or Provider) Contacted: Dr. Drue Second, Dr. Gerri Lins  Current antibiotics: Linezolid and piperacillin/tazobactam  Changes to prescribed antibiotics recommended:  Patient is on recommended antibiotics - No changes needed  ID to see patient  Results for orders placed or performed during the hospital encounter of 03/14/20  Blood Culture ID Panel (Reflexed) (Collected: 03/14/2020  7:30 PM)  Result Value Ref Range   Enterococcus faecalis NOT DETECTED NOT DETECTED   Enterococcus Faecium NOT DETECTED NOT DETECTED   Listeria monocytogenes NOT DETECTED NOT DETECTED   Staphylococcus species NOT DETECTED NOT DETECTED   Staphylococcus aureus (BCID) NOT DETECTED NOT DETECTED   Staphylococcus epidermidis NOT DETECTED NOT DETECTED   Staphylococcus lugdunensis NOT DETECTED  NOT DETECTED   Streptococcus species NOT DETECTED NOT DETECTED   Streptococcus agalactiae NOT DETECTED NOT DETECTED   Streptococcus pneumoniae NOT DETECTED NOT DETECTED   Streptococcus pyogenes NOT DETECTED NOT DETECTED   A.calcoaceticus-baumannii NOT DETECTED NOT DETECTED   Bacteroides fragilis NOT DETECTED NOT DETECTED   Enterobacterales NOT DETECTED NOT DETECTED   Enterobacter cloacae complex NOT DETECTED NOT DETECTED   Escherichia coli NOT DETECTED NOT DETECTED   Klebsiella aerogenes NOT DETECTED NOT DETECTED   Klebsiella oxytoca NOT DETECTED NOT DETECTED   Klebsiella pneumoniae NOT DETECTED NOT DETECTED   Proteus species NOT DETECTED NOT DETECTED   Salmonella species NOT DETECTED NOT DETECTED   Serratia marcescens NOT DETECTED NOT DETECTED   Haemophilus influenzae NOT DETECTED NOT DETECTED   Neisseria meningitidis NOT DETECTED NOT DETECTED   Pseudomonas aeruginosa NOT DETECTED NOT DETECTED   Stenotrophomonas maltophilia NOT DETECTED NOT DETECTED   Candida albicans NOT DETECTED NOT DETECTED   Candida auris NOT DETECTED NOT DETECTED   Candida glabrata NOT DETECTED NOT DETECTED   Candida krusei NOT DETECTED NOT DETECTED   Candida parapsilosis NOT DETECTED NOT DETECTED   Candida tropicalis NOT DETECTED NOT DETECTED   Cryptococcus neoformans/gattii NOT DETECTED NOT DETECTED   Margarite Gouge, PharmD PGY2 ID Pharmacy Resident [phone]  03/18/2020  9:59 AM

## 2020-03-18 NOTE — Consult Note (Addendum)
[name]  INPATIENT CONSULTATION  Reason for Consultation: AKI Requesting Provider: Dr. Gerri LinsSwayze  HPI: Katherine Cabrera is an 45 y.o. female with DM, HTN, recent COVID infection (1/8), recent enteritis (1/13 ED visit) who is currently admitted with pelvic abscess and seen for evaluation and management of AKI>    Underwent umbilical hernia repair, hysterectomy, BL salpingo-oophorectomy with lysis of adhesions in November 2021.  Had covid 2.5 weeks ago.  03/06/20 seen in ED c/o abd pain tx with dicyclomine and naproxen.  Returned 03/14/20 with fevers, cont abd pain and had CT A/P with contrast showing developing abscesses.  BPs low 80/40s initially, febrile, leukopenic with WBC 2.2.   Treated with zosyn and linezolid, midodrine and IVF for hypotension. Had been taking home lisinopril 2.5 daily and BID napoxyn; low po intake.  Cr 1/21 was 0.7 1/23 2.0, yesterday 2.8, today 1/25 3.05.  UOP 500 > 775 > 315 as of 10am today.   OR yesterday for washout had transient MAP into the ~50 range but otherwise BPs ok here.   She currently is asking about when she can have po intake - hasn't passed gas get and has NG to suction with cannister with good volume.  Otherwise she denies current complaints.   PMH: Past Medical History:  Diagnosis Date  . De Quervain's disease (radial styloid tenosynovitis)   . Diabetes mellitus without complication (HCC)   . Hypertension    PSH: Past Surgical History:  Procedure Laterality Date  . ABDOMINAL HYSTERECTOMY    . CESAREAN SECTION    . HERNIA REPAIR    . LAPAROSCOPY N/A 03/17/2020   Procedure: LAPAROSCOPY DIAGNOSTIC;  ABDOMINAL WASH OUT, PELVIC EXAM UNDER ANESTHESIA, RIGID SIGMOIDOSCOPY;  Surgeon: Berna Bueonnor, Chelsea A, MD;  Location: [facility] OR;  Service: General;  Laterality: N/A;    Past Medical History:  Diagnosis Date  . De Quervain's disease (radial styloid tenosynovitis)   . Diabetes mellitus without complication (HCC)   . Hypertension      Medications:  I have reviewed the patient's current medications.  Medications Prior to Admission  Medication Sig Dispense Refill  . atorvastatin (LIPITOR) 40 MG tablet Take 40 mg by mouth at bedtime.    . dicyclomine (BENTYL) 20 MG tablet Take 1 tablet (20 mg total) by mouth 2 (two) times daily. 20 tablet 0  . lisinopril (ZESTRIL) 2.5 MG tablet Take 2.5 mg by mouth daily.    . metFORMIN (GLUCOPHAGE-XR) 500 MG 24 hr tablet Take 1,000 mg by mouth 2 (two) times daily.    . Multiple Vitamin (MULTI-VITAMIN) tablet Take 1 tablet by mouth daily.    . naproxen (NAPROSYN) 500 MG tablet Take 1 tablet (500 mg total) by mouth 2 (two) times daily. 30 tablet 0  . Continuous Blood Gluc Sensor (FREESTYLE LIBRE 14 DAY SENSOR) MISC Apply 1 each topically every 14 (fourteen) days.      ALLERGIES:  No Known Allergies  FAM HX: History reviewed. No pertinent family history.  Social History:   reports that she has never smoked. She has never used smokeless tobacco. She reports that she does not drink alcohol and does not use drugs.  ROS: 12 system ROS per HPI above  Blood pressure 109/77, pulse 97, temperature 98.2 F (36.8 C), temperature source Oral, resp. rate 20, height 5\' 3"  (1.6 m), weight 96.2 kg, last menstrual period 05/07/2019, SpO2 100 %. PHYSICAL EXAM: Gen: overweight, appears somewhat uncomfortable but nontoxic  Eyes: anicteic ENT: MM dry, NG tube in place, bilious fluid  in can Neck: supple, no JVD CV:  RRR, no rub Abd:  Soft, mod TTP Lungs; clear GU: foley draining clear yellow/amber urine Extr:  No edema Neuro: nonfocal Skin: no rashes   Results for orders placed or performed during the hospital encounter of 03/14/20 (from the past 48 hour(s))  Glucose, capillary     Status: None   Collection Time: 03/16/20  1:22 PM  Result Value Ref Range   Glucose-Capillary 88 70 - 99 mg/dL    Comment: Glucose reference range applies only to samples taken after fasting for at least 8  hours.  Glucose, capillary     Status: None   Collection Time: 03/16/20  6:16 PM  Result Value Ref Range   Glucose-Capillary 99 70 - 99 mg/dL    Comment: Glucose reference range applies only to samples taken after fasting for at least 8 hours.  Glucose, capillary     Status: Abnormal   Collection Time: 03/16/20  8:30 PM  Result Value Ref Range   Glucose-Capillary 100 (H) 70 - 99 mg/dL    Comment: Glucose reference range applies only to samples taken after fasting for at least 8 hours.  Glucose, capillary     Status: Abnormal   Collection Time: 03/17/20  7:34 AM  Result Value Ref Range   Glucose-Capillary 106 (H) 70 - 99 mg/dL    Comment: Glucose reference range applies only to samples taken after fasting for at least 8 hours.  Glucose, capillary     Status: Abnormal   Collection Time: 03/17/20  8:38 AM  Result Value Ref Range   Glucose-Capillary 103 (H) 70 - 99 mg/dL    Comment: Glucose reference range applies only to samples taken after fasting for at least 8 hours.  I-STAT, chem 8     Status: Abnormal   Collection Time: 03/17/20  9:30 AM  Result Value Ref Range   Sodium 141 135 - 145 mmol/L   Potassium 4.0 3.5 - 5.1 mmol/L   Chloride 108 98 - 111 mmol/L   BUN 30 (H) 6 - 20 mg/dL   Creatinine, Ser 9.62 (H) 0.44 - 1.00 mg/dL   Glucose, Bld 836 (H) 70 - 99 mg/dL    Comment: Glucose reference range applies only to samples taken after fasting for at least 8 hours.   Calcium, Ion 0.96 (L) 1.15 - 1.40 mmol/L   TCO2 21 (L) 22 - 32 mmol/L   Hemoglobin 12.2 12.0 - 15.0 g/dL   HCT 62.9 47.6 - 54.6 %  Glucose, capillary     Status: Abnormal   Collection Time: 03/17/20 12:07 PM  Result Value Ref Range   Glucose-Capillary 107 (H) 70 - 99 mg/dL    Comment: Glucose reference range applies only to samples taken after fasting for at least 8 hours.   Comment 1 Notify RN   Glucose, capillary     Status: Abnormal   Collection Time: 03/17/20  3:27 PM  Result Value Ref Range    Glucose-Capillary 100 (H) 70 - 99 mg/dL    Comment: Glucose reference range applies only to samples taken after fasting for at least 8 hours.  Glucose, capillary     Status: Abnormal   Collection Time: 03/17/20  8:48 PM  Result Value Ref Range   Glucose-Capillary 135 (H) 70 - 99 mg/dL    Comment: Glucose reference range applies only to samples taken after fasting for at least 8 hours.  CBC     Status: Abnormal   Collection Time:  03/18/20  4:25 AM  Result Value Ref Range   WBC 12.0 (H) 4.0 - 10.5 K/uL   RBC 4.13 3.87 - 5.11 MIL/uL   Hemoglobin 11.2 (L) 12.0 - 15.0 g/dL   HCT 13.0 (L) 86.5 - 78.4 %   MCV 79.4 (L) 80.0 - 100.0 fL   MCH 27.1 26.0 - 34.0 pg   MCHC 34.1 30.0 - 36.0 g/dL   RDW 69.6 29.5 - 28.4 %   Platelets 132 (L) 150 - 400 K/uL   nRBC 0.0 0.0 - 0.2 %    Comment: Performed at [facility], [address], [place], [place] [address]  Comprehensive metabolic panel     Status: Abnormal   Collection Time: 03/18/20  4:25 AM  Result Value Ref Range   Sodium 138 135 - 145 mmol/L   Potassium 4.0 3.5 - 5.1 mmol/L   Chloride 109 98 - 111 mmol/L   CO2 16 (L) 22 - 32 mmol/L   Glucose, Bld 178 (H) 70 - 99 mg/dL    Comment: Glucose reference range applies only to samples taken after fasting for at least 8 hours.   BUN 39 (H) 6 - 20 mg/dL   Creatinine, Ser 0.10 (H) 0.44 - 1.00 mg/dL   Calcium 6.6 (L) 8.9 - 10.3 mg/dL   Total Protein 5.3 (L) 6.5 - 8.1 g/dL   Albumin 1.8 (L) 3.5 - 5.0 g/dL   AST 35 15 - 41 U/L   ALT 22 0 - 44 U/L   Alkaline Phosphatase 67 38 - 126 U/L   Total Bilirubin 1.1 0.3 - 1.2 mg/dL   GFR, Estimated 19 (L) >60 mL/min    Comment: (NOTE) Calculated using the CKD-EPI Creatinine Equation (2021)    Anion gap 13 5 - 15    Comment: Performed at [facility], [address], [place], [place] [address]  Glucose, capillary     Status: Abnormal   Collection Time: 03/18/20  8:03 AM  Result Value Ref Range   Glucose-Capillary 144 (H) 70 - 99 mg/dL     Comment: Glucose reference range applies only to samples taken after fasting for at least 8 hours.    CT PELVIS WO CONTRAST  Result Date: 03/16/2020 CLINICAL DATA:  Development pelvic fluid collections after prior hysterectomy with suspicion of potential abscesses. One of these collections that is located in the posterior pelvis anterior to the rectum is potentially accessible to percutaneous aspiration/drainage. A deeper left-sided collection is not accessible to drainage given bowel loops and bladder anteriorly and major vessels and bony structures posteriorly. The patient presents for imaging prior to planned possible aspiration or catheter drainage of the posterior pelvic fluid collection. EXAM: CT PELVIS WITHOUT CONTRAST TECHNIQUE: Multidetector CT imaging of the pelvis was performed following the standard protocol without intravenous contrast. COMPARISON:  CT of the abdomen and pelvis on 03/14/2020 FINDINGS: Urinary Tract: The bladder is distended with excreted contrast from prior CT. Bowel: Pelvic bowel loops demonstrate some persistent dilatation of small bowel likely consistent with ileus. Component of partial obstruction is not excluded. Vascular/Lymphatic: No enlarged lymph nodes or vascular abnormalities by unenhanced CT. Reproductive:  Status post hysterectomy. Other: The previously noted posterior fluid collection anterior to the rectum is largely dissipated in the prone position likely due to anterior transit of free fluid. No significant discrete focal fluid collection is identified to allow for percutaneous drainage. The deeper left-sided rounded pelvic collection does appear to remain present in the prone position and measures  approximately 4 cm. This is located posterior to the bladder and there is no safe percutaneous window for access to this collection posteriorly due to major iliac vessels as well as the bony pelvis. Musculoskeletal: Unremarkable. IMPRESSION: 1. The previously noted  posterior fluid collection anterior to the rectum is largely dissipated in the prone position likely due to anterior transit of free fluid. No significant discrete focal fluid collection is identified to allow for percutaneous drainage. The deeper left-sided rounded collection does appear to remain present in the prone position and measures approximately 4 cm. This is located posterior to the bladder and there is no safe percutaneous window for access to this collection posteriorly due to major iliac vessels as well as the bony pelvis. 2. Persistent dilatation of small bowel likely consistent with ileus. Component of partial obstruction is not excluded. Electronically Signed   By: Irish Lack M.D.   On: 03/16/2020 10:50   DG Abd Portable 1V  Result Date: 03/17/2020 CLINICAL DATA:  Abdominal pain EXAM: PORTABLE ABDOMEN - 1 VIEW COMPARISON:  CT abdomen and pelvis March 14, 2020 FINDINGS: There are loops of dilated small bowel without air-fluid levels. No free air. No abnormal calcifications. IMPRESSION: Dilated loops of small bowel raising concern for potential degree of bowel obstruction. Ileus and enteritis are differential considerations. Electronically Signed   By: Bretta Bang III M.D.   On: 03/17/2020 08:11    Assessment/Plan **sepsis secondary to pelvic abscesses:  On antibiotic coverage and s/p surgical wash out.  Management per primary.  I don't think she has AIN so no need to change abx at this time.   **AKI: Suspect multifactorial with hypovolemia, hypotension with concurrent ACEi, NSAID, contrast.  CT without evidence of obstruction; has foley catheter now.  Cont to hold ACEi, maintain euvolemia (continue MIVF for now), avoid nephrotoxins and hypotension as able.   **Metabolic acidosis: secondary to AKI; mild.  Follow, can supplement bicarb if not improving.    **HTN:  BP currently acceptable without meds.    **DM: well controlled, no metformin for now.   Will follow. Call with  issues.   Tyler Pita 03/18/2020, 9:53 AM

## 2020-03-18 NOTE — Progress Notes (Signed)
PROGRESS NOTE  Shevonne Wolf IRS:[id number] DOB: 1975-08-01 DOA: 03/14/2020 PCP: Patient, No Pcp Per  Brief History   Patient is a 45 y.o. female with PMHx of HTN, HLD, DM-2, morbid obesity-recent history of hysterectomy/bilateral salpingo-oophorectomy/umbilical hernia repair in November 2021-at [facility] hospital-presenting with worsening abdominal pain-upon imaging studies-found to have pelvic abscess.  The patient was taken to the OR this morning for diagnostic laparoscopy, abdominal washout and lysis of inflammatory adhesions, drain placement, rigid sigmoidoscopy.  Intraoperative consult by Dr. Macon Large who performed a pelvic exam under anesthesia and instillation of the vaginal vault with methylene blue. No leak was identified.  The patient has tolerated the procedure well.   She is receiving IV Zyvox and zosyn. No cultures were obtained inter-operatively, but infectious disease has been consulted. The patient has been changed over to doxycycline and Unasyn IV until the patient is able to oral antibiotics.   Consultants  . OB/Gyn . General Surgery . Interventional Radiology  Procedures  . Placement of abscess drain in pelvis by IR on 03/15/2020 . Diagnostic laparoscopy, abdominal washout and lysis of inflammatory adhesions, drain placement, rigid sigmoidoscopy.  Intraoperative consult by Dr. Macon Large who performed a pelvic exam under anesthesia and instillation of the vaginal vault with methylene blue.  Antibiotics   Anti-infectives (From admission, onward)   Start     Dose/Rate Route Frequency Ordered Stop   03/18/20 1400  doxycycline (VIBRAMYCIN) 100 mg in sodium chloride 0.9 % 250 mL IVPB        100 mg 125 mL/hr over 120 Minutes Intravenous Every 12 hours 03/18/20 1159     03/18/20 1400  Ampicillin-Sulbactam (UNASYN) 3 g in sodium chloride 0.9 % 100 mL IVPB        3 g 200 mL/hr over 30 Minutes Intravenous Every 12 hours 03/18/20 1201     03/17/20 2000  linezolid (ZYVOX)  IVPB 600 mg  Status:  Discontinued        600 mg 300 mL/hr over 60 Minutes Intravenous Every 12 hours 03/17/20 1437 03/18/20 1159   03/17/20 1445  linezolid (ZYVOX) IVPB 600 mg  Status:  Discontinued        600 mg 300 mL/hr over 60 Minutes Intravenous Every 12 hours 03/17/20 1350 03/17/20 1437   03/17/20 1445  piperacillin-tazobactam (ZOSYN) IVPB 3.375 g  Status:  Discontinued        3.375 g 12.5 mL/hr over 240 Minutes Intravenous Every 8 hours 03/17/20 1437 03/18/20 1159   03/16/20 2300  vancomycin (VANCOCIN) IVPB 1000 mg/200 mL premix  Status:  Discontinued        1,000 mg 200 mL/hr over 60 Minutes Intravenous Every 24 hours 03/16/20 1347 03/17/20 1325   03/15/20 2300  vancomycin (VANCOCIN) IVPB 1000 mg/200 mL premix  Status:  Discontinued        1,000 mg 200 mL/hr over 60 Minutes Intravenous Every 12 hours 03/15/20 1028 03/16/20 1347   03/15/20 0830  vancomycin (VANCOCIN) IVPB 1000 mg/200 mL premix       "Followed by" Linked Group Details   1,000 mg 200 mL/hr over 60 Minutes Intravenous  Once 03/15/20 0722 03/15/20 0940   03/15/20 0800  piperacillin-tazobactam (ZOSYN) IVPB 3.375 g  Status:  Discontinued        3.375 g 12.5 mL/hr over 240 Minutes Intravenous Every 8 hours 03/15/20 0722 03/17/20 1437   03/15/20 0730  vancomycin (VANCOCIN) IVPB 1000 mg/200 mL premix       "Followed by" Linked Group Details   1,000  mg 200 mL/hr over 60 Minutes Intravenous  Once 03/15/20 0722 03/15/20 0835   03/14/20 2045  piperacillin-tazobactam (ZOSYN) IVPB 3.375 g        3.375 g 100 mL/hr over 30 Minutes Intravenous  Once 03/14/20 2032 03/14/20 2113     Subjective  The patient is resting comfortably. No new complaints. NGT in place. She states that she has not had flatus or BM.  Objective   Vitals:  Vitals:   03/17/20 2302 03/18/20 0414  BP: 103/68 109/77  Pulse: 96 97  Resp: 20   Temp: 98.1 F (36.7 C) 98.2 F (36.8 C)  SpO2: 100% 100%   Exam:  Constitutional:  . The patient is  awake, alert, and oriented x 3. No acute distress. Respiratory:  . No increased work of breathing. . No wheezes, rales, or rhonchi . No tactile fremitus Cardiovascular:  . Regular rate and rhythm . No murmurs, ectopy, or gallups. . No lateral PMI. No thrills. Abdomen:  . Abdomen is soft, tender, and slightly distended . No hernias, masses, or organomegaly . Hypoactive bowel sounds.  Musculoskeletal:  . No cyanosis, clubbing, or edema Skin:  . No rashes, lesions, ulcers . palpation of skin: no induration or nodules Neurologic:  . CN 2-12 intact . Sensation all 4 extremities intact Psychiatric:  . Mental status o Mood, affect appropriate o Orientation to person, place, time  . judgment and insight appear intact  I have personally reviewed the following:   Today's Data  . Vitals, BMP, EKG, Glucoses  Micro Data  . Blood culture: No growth  Imaging  . CT Pelvis  Cardiology Data  . EKG  Scheduled Meds: . Chlorhexidine Gluconate Cloth  6 each Topical Daily  . enoxaparin (LOVENOX) injection  30 mg Subcutaneous Q24H  . insulin aspart  0-6 Units Subcutaneous TID WC   Continuous Infusions: . sodium chloride 125 mL/hr at 03/18/20 0750  . ampicillin-sulbactam (UNASYN) IV 3 g (03/18/20 1344)  . doxycycline (VIBRAMYCIN) IV    . famotidine (PEPCID) IV 20 mg (03/18/20 0750)    Principal Problem:   Sepsis (HCC) Active Problems:   Pelvic abscess in female   Transient hypotension   Enteritis   COVID-19 virus infection   Normocytic anemia   Type 2 diabetes mellitus with hyperlipidemia (HCC)   Intraabdominal fluid collection   S/P hysterectomy   LOS: 3 days   A & P  The patient was taken to the OR this morning for diagnostic laparoscopy, abdominal washout and lysis of inflammatory adhesions, drain placement, rigid sigmoidoscopy.  Intraoperative consult by Dr. Macon Large who performed a pelvic exam under anesthesia and instillation of the vaginal vault with methylene blue.  Sepsis physiology improving-she does not have a toxic appearance-but blood pressure soft-continue IV fluids-we will add low-dose midodrine-remains on broad-spectrum antibiotics.  General surgery/IR following with plans for CT-guided pelvic drain placement on 03/15/2020. The patient was taken to the OR this morning for diagnostic laparoscopy, abdominal washout and lysis of inflammatory adhesions, drain placement, rigid sigmoidoscopy.  Intraoperative consult by Dr. Macon Large who performed a pelvic exam under anesthesia and instillation of the vaginal vault with methylene blue. The patient is receiving IV Zosyn and Zyvox currently.   AKI: Creatinine is increased from 2.00 to 3.0. BP is better, so will stop midodrine and increase IV fluids. Avoid nephrotoxic medications and hypotension. Monitor creatinine, electrolytes, and volume status. Nephrology is consulted due to continued increase of creatinine.   Normocytic anemia: Due to acute illness and IV fluid  dilution.  Follow CBC  HTN: The patient is normotensive without antihypertensives or midodrine. Monitor.  HLD: Continue statin  DM-2 (A1c 6.2 on 1/23): CBG stable-on SSI-oral hypoglycemic agents remain on hold,   Recent history of COVID-19 infection: Diagnosed on 12/31-see report below.  Asymptomatic currently-does not require isolation.  Morbid Obesity:Estimated body mass index is 37.55 kg/m as calculated from the following:   Height as of this encounter: 5\' 3"  (1.6 m).   Weight as of this encounter: 96.2 kg.   I have seen this patient myself. I have spent 36 minutes in her evaluation and care.  DVT Prophylaxis: Lovenox (held prior to surgery) CODE STATUS: Full Code Family Communication: None available Disposition: Status is: Inpatient  Remains inpatient appropriate because:IV treatments appropriate due to intensity of illness or inability to take PO   Dispo: The patient is from: Home              Anticipated d/c is to: Home               Anticipated d/c date is: 3 days              Patient currently is not medically stable to d/c.   Difficult to place patient No  Simone Tuckey, DO [facility] Direct contact: see [email or web address]  7PM-7AM contact night coverage as above 03/18/2020, 2:45 PM  LOS: 2 days

## 2020-03-18 NOTE — Consult Note (Signed)
[facility]    Date of Admission:  03/14/2020     Total days of antibiotics 5  Zosyn   Linezolid   Vancomycin 1/22 >> 1/23        Reason for Consult: Pelvic infection     [facility]  Primary Care Provider: Patient, No Pcp Per    Assessment: Katherine Cabrera is a 45 y.o. female admitted for management of pelvic abscess. Previously underwent hysterectomy and hernia repair in November 2021. Recover was going well and started noticing some vaginal bleeding following sexual intercourse with her husband. She then contracted COVID, ultimately mild case, at the beginning of January. This eventually progressed to lower abdominal pain initially thought to be enteritis that later evolved to large pelvic abscesses. She is now S/P laparotomy to clean out the abdomen and pelvis and has had 1 JP drain in place. No cultures were sent during procedure. There is suspicion on behalf of surgery team this may have stemmed from a vaginal cuff leak given inflamed appearance, but ultimately intact on inspection. She developed AKI on vancomycin and zosyn and now continues on linezolid and zosyn.   She did today have gram positive cocci in chains growing in anaerobic blood bottle we were notified of today. Nothing triggered on BCID. ?peptostreptococcus maybe. Suspect anaerobe - Will change to Doxycycline + Unasyn IV for broad coverage (until she can take POs). Would not suspect pseudomonas contributing.      Plan: 1. Change to IV Doxycycline + Unasyn 2. Follow for conversion to PO therapy  3. Surgery following  4. JP remains - follow output and repeat imaging for length of therapy recs.   Will follow up with the patient again in AM.       Principal Problem:   Sepsis (HCC) Active Problems:   Pelvic abscess in female   Transient hypotension   Enteritis   COVID-19 virus infection   Normocytic anemia   Type 2 diabetes mellitus with hyperlipidemia (HCC)    Intraabdominal fluid collection   S/P hysterectomy   . Chlorhexidine Gluconate Cloth  6 each Topical Daily  . enoxaparin (LOVENOX) injection  30 mg Subcutaneous Q24H  . insulin aspart  0-6 Units Subcutaneous TID WC    HPI: Katherine SimplerLakisha Maskell is a 45 y.o. female transferred to [facility] for chills and abdominal pain.   She underwent a hysterectomy, b/l salpingo-oopherectomy with lysis of adhesions in November 2021. Recovery went pretty well. Waited the 6 weeks prior to any sexual intercourse with her husband - noted some bright red vaginal bleeding the one time they had intercourse; this was a few weeks ago and it continued intermittently since. Some mild pain during sex but none since, however vaginal bleeding did persist. She did have COVID infection (mild) recently with symptoms first starting 12/30. Her symptoms have nearly all resolved from this. No new symptoms but was upset she may have "gotten it again" with positive test during current admission.   Went for care on 1/13 following COVID diagnosis for evaluation of abdominal pain. CT scan revealed fluid around the abdomen c/w enteritis possibly. Given dicyclomine and naproxen and sent home. This seemed to improve her symptoms and she returned to work as usual.  This past Friday night she started noticing some mild lower abdominal pain. Worked normal day that day and otherwise went to bed. She woke up trying to have a BM and noted  severe shaking chills the next morning. Her husband helped her get off the toilet but this persisted while in bed. EMS was called to her house and noted high fever 103.6 F. Leukopenia 2.2K noted and lactate 1.6.  CT of the abdomen revealed peripherally enhancing fluid collections deep in the pelvis c/w developing abscesses. Free fluid noted in the abdomen new from CT scan 10 days prior. 1/23 pelvic scan noted with some fluid collections amenable to IR drain but others that were not. Dr.  Macon Large (GYN) and Dr. Eustaquio Boyden (GEN) took the patient to the operating room 1/24 to evaluate - very inflamed vaginal cuff that did not appear to have dehiscence, but microperforation not excluded. Dilated small bowel and stomach loops noted. Inflammatory adhesions noted prior to encountering thick purulent fluid with pelvic rind to the pelvic wall. Largely edematous left ovary. Unfortunately no cultures were taken in the OR. Was started on IV vancomycin + zosyn but had AKI and changed to linezolid and zosyn.   She has much improved abdominal pain. Most trouble for her presently is the NG tube pain and discomfort. She has not had any fevers or chills. Making urine "she thinks" with foley in place. Hopeful she can have something to drink soon.    Review of Systems: Review of Systems  Constitutional: Positive for chills, fever and malaise/fatigue.  Respiratory: Negative for cough and shortness of breath.   Cardiovascular: Negative for chest pain.  Gastrointestinal: Positive for abdominal pain, nausea and vomiting. Negative for constipation and diarrhea.  Genitourinary: Negative for dysuria.  Musculoskeletal: Negative for myalgias.  Skin: Negative for rash.    Past Medical History:  Diagnosis Date  . De Quervain's disease (radial styloid tenosynovitis)   . Diabetes mellitus without complication (HCC)   . Hypertension     Social History   Tobacco Use  . Smoking status: Never Smoker  . Smokeless tobacco: Never Used  Substance Use Topics  . Alcohol use: No  . Drug use: No    History reviewed. No pertinent family history. No Known Allergies  OBJECTIVE: Blood pressure 109/77, pulse 97, temperature 98.2 F (36.8 C), temperature source Oral, resp. rate 20, height 5\' 3"  (1.6 m), weight 96.2 kg, last menstrual period 05/07/2019, SpO2 100 %.  Physical Exam Constitutional:      Appearance: She is well-developed. She is obese.     Comments: Sitting upright in chair. Appears uncomfortable.    Neurological:     Mental Status: She is alert.     Lab Results Lab Results  Component Value Date   WBC 12.0 (H) 03/18/2020   HGB 11.2 (L) 03/18/2020   HCT 32.8 (L) 03/18/2020   MCV 79.4 (L) 03/18/2020   PLT 132 (L) 03/18/2020    Lab Results  Component Value Date   CREATININE 3.05 (H) 03/18/2020   BUN 39 (H) 03/18/2020   NA 138 03/18/2020   K 4.0 03/18/2020   CL 109 03/18/2020   CO2 16 (L) 03/18/2020    Lab Results  Component Value Date   ALT 22 03/18/2020   AST 35 03/18/2020   ALKPHOS 67 03/18/2020   BILITOT 1.1 03/18/2020     Microbiology: Recent Results (from the past 240 hour(s))  SARS Coronavirus 2 by RT PCR (hospital order, performed in [facility] hospital lab) Nasopharyngeal Peripheral     Status: Abnormal   Collection Time: 03/14/20  7:20 PM   Specimen: Peripheral; Nasopharyngeal  Result Value Ref Range Status   SARS Coronavirus 2 POSITIVE (A)  NEGATIVE Final    Comment: RESULT CALLED TO, READ BACK BY AND VERIFIED WITH: NEAL KELLIE RN AT 2044 ON 03/14/20 BY I.SUGUT (NOTE) SARS-CoV-2 target nucleic acids are DETECTED  SARS-CoV-2 RNA is generally detectable in upper respiratory specimens  during the acute phase of infection.  Positive results are indicative  of the presence of the identified virus, but do not rule out bacterial infection or co-infection with other pathogens not detected by the test.  Clinical correlation with patient history and  other diagnostic information is necessary to determine patient infection status.  The expected result is negative.  Fact Sheet for Patients:   BoilerBrush.com.cy   Fact Sheet for Healthcare Providers:   https://pope.com/    This test is not yet approved or cleared by the [place] FDA and  has been authorized for detection and/or diagnosis of SARS-CoV-2 by FDA under an Emergency Use Authorization (EUA).  This EUA will remain in effect (meani ng this test can  be used) for the duration of  the COVID-19 declaration under Section 564(b)(1) of the Act, 21 U.S.C. section 360-bbb-3(b)(1), unless the authorization is terminated or revoked sooner.  Performed at [facility], [address] [place], [place] [address]   Blood Culture (routine x 2)     Status: None (Preliminary result)   Collection Time: 03/14/20  7:30 PM   Specimen: BLOOD  Result Value Ref Range Status   Specimen Description   Final    BLOOD RIGHT ANTECUBITAL Performed at [facility], [address] [place], [place] [address]    Special Requests   Final    BOTTLES DRAWN AEROBIC AND ANAEROBIC Blood Culture adequate volume Performed at [facility], [address] [place], [place] [address]    Culture  Setup Time   Final    GRAM POSITIVE COCCI IN CHAINS ANAEROBIC BOTTLE ONLY Organism ID to follow CRITICAL RESULT CALLED TO, READ BACK BY AND VERIFIED WITH: A. Artis Flock PharmD 9:30 03/18/20 ([email or web address])    Culture   Final    NO GROWTH 2 DAYS Performed at [facility], [address], [place], [place] [address]    Report Status PENDING  Incomplete  Blood Culture ID Panel (Reflexed)     Status: None   Collection Time: 03/14/20  7:30 PM  Result Value Ref Range Status   Enterococcus faecalis NOT DETECTED NOT DETECTED Final   Enterococcus Faecium NOT DETECTED NOT DETECTED Final   Listeria monocytogenes NOT DETECTED NOT DETECTED Final   Staphylococcus species NOT DETECTED NOT DETECTED Final   Staphylococcus aureus (BCID) NOT DETECTED NOT DETECTED Final   Staphylococcus epidermidis NOT DETECTED NOT DETECTED Final   Staphylococcus lugdunensis NOT DETECTED NOT DETECTED Final   Streptococcus species NOT DETECTED NOT DETECTED Final   Streptococcus agalactiae NOT DETECTED NOT DETECTED Final   Streptococcus pneumoniae NOT DETECTED NOT DETECTED Final   Streptococcus pyogenes NOT DETECTED NOT DETECTED Final   A.calcoaceticus-baumannii NOT DETECTED NOT  DETECTED Final   Bacteroides fragilis NOT DETECTED NOT DETECTED Final   Enterobacterales NOT DETECTED NOT DETECTED Final   Enterobacter cloacae complex NOT DETECTED NOT DETECTED Final   Escherichia coli NOT DETECTED NOT DETECTED Final   Klebsiella aerogenes NOT DETECTED NOT DETECTED Final   Klebsiella oxytoca NOT DETECTED NOT DETECTED Final   Klebsiella pneumoniae NOT DETECTED NOT DETECTED Final   Proteus species NOT DETECTED NOT DETECTED Final   Salmonella species NOT DETECTED NOT DETECTED Final   Serratia  marcescens NOT DETECTED NOT DETECTED Final   Haemophilus influenzae NOT DETECTED NOT DETECTED Final   Neisseria meningitidis NOT DETECTED NOT DETECTED Final   Pseudomonas aeruginosa NOT DETECTED NOT DETECTED Final   Stenotrophomonas maltophilia NOT DETECTED NOT DETECTED Final   Candida albicans NOT DETECTED NOT DETECTED Final   Candida auris NOT DETECTED NOT DETECTED Final   Candida glabrata NOT DETECTED NOT DETECTED Final   Candida krusei NOT DETECTED NOT DETECTED Final   Candida parapsilosis NOT DETECTED NOT DETECTED Final   Candida tropicalis NOT DETECTED NOT DETECTED Final   Cryptococcus neoformans/gattii NOT DETECTED NOT DETECTED Final    Comment: Performed at [facility], [address], [place], [place] [address]  Blood Culture (routine x 2)     Status: None (Preliminary result)   Collection Time: 03/15/20 11:57 AM   Specimen: BLOOD  Result Value Ref Range Status   Specimen Description BLOOD LEFT ANTECUBITAL  Final   Special Requests   Final    BOTTLES DRAWN AEROBIC ONLY Blood Culture adequate volume   Culture   Final    NO GROWTH 2 DAYS Performed at [facility], [address] [place], [place] [address]    Report Status PENDING  Incomplete    Rexene Alberts, MSN, NP-C [facility] Cell: [phone] Pager: [phone]  03/18/2020 12:58 PM

## 2020-03-19 DIAGNOSIS — D708 Other neutropenia: Secondary | ICD-10-CM | POA: Diagnosis not present

## 2020-03-19 DIAGNOSIS — R188 Other ascites: Secondary | ICD-10-CM | POA: Diagnosis not present

## 2020-03-19 DIAGNOSIS — N739 Female pelvic inflammatory disease, unspecified: Secondary | ICD-10-CM | POA: Diagnosis not present

## 2020-03-19 DIAGNOSIS — D709 Neutropenia, unspecified: Secondary | ICD-10-CM | POA: Diagnosis not present

## 2020-03-19 LAB — CBC
HCT: 37.6 % (ref 36.0–46.0)
Hemoglobin: 12.1 g/dL (ref 12.0–15.0)
MCH: 26 pg (ref 26.0–34.0)
MCHC: 32.2 g/dL (ref 30.0–36.0)
MCV: 80.7 fL (ref 80.0–100.0)
Platelets: 158 10*3/uL (ref 150–400)
RBC: 4.66 MIL/uL (ref 3.87–5.11)
RDW: 14.8 % (ref 11.5–15.5)
WBC: 10.8 10*3/uL — ABNORMAL HIGH (ref 4.0–10.5)
nRBC: 0 % (ref 0.0–0.2)

## 2020-03-19 LAB — GLUCOSE, CAPILLARY
Glucose-Capillary: 127 mg/dL — ABNORMAL HIGH (ref 70–99)
Glucose-Capillary: 111 mg/dL — ABNORMAL HIGH (ref 70–99)
Glucose-Capillary: 101 mg/dL — ABNORMAL HIGH (ref 70–99)
Glucose-Capillary: 98 mg/dL (ref 70–99)

## 2020-03-19 LAB — BASIC METABOLIC PANEL
Anion gap: 12 (ref 5–15)
BUN: 47 mg/dL — ABNORMAL HIGH (ref 6–20)
CO2: 17 mmol/L — ABNORMAL LOW (ref 22–32)
Calcium: 6.6 mg/dL — ABNORMAL LOW (ref 8.9–10.3)
Chloride: 114 mmol/L — ABNORMAL HIGH (ref 98–111)
Creatinine, Ser: 2.98 mg/dL — ABNORMAL HIGH (ref 0.44–1.00)
GFR, Estimated: 19 mL/min — ABNORMAL LOW (ref >60)
Glucose, Bld: 156 mg/dL — ABNORMAL HIGH (ref 70–99)
Potassium: 4 mmol/L (ref 3.5–5.1)
Sodium: 143 mmol/L (ref 135–145)

## 2020-03-19 LAB — MAGNESIUM: Magnesium: 1.9 mg/dL (ref 1.7–2.4)

## 2020-03-19 MED ORDER — TRAMADOL HCL 50 MG PO TABS
50.0000 mg | ORAL_TABLET | Freq: Four times a day (QID) | ORAL | Status: DC | PRN
  Administered 2020-03-19 – 2020-03-22 (×4): 50 mg via ORAL
  Filled 2020-03-19 (×5): qty 1

## 2020-03-19 MED ORDER — HYDROMORPHONE HCL 1 MG/ML IJ SOLN: 0.5000 mg | INTRAMUSCULAR | Status: DC | PRN

## 2020-03-19 MED ORDER — SODIUM CHLORIDE 0.9 % IV BOLUS
500.0000 mL | Freq: Once | INTRAVENOUS | Status: AC
  Administered 2020-03-19: 500 mL via INTRAVENOUS

## 2020-03-19 MED ORDER — SODIUM BICARBONATE 650 MG PO TABS
1300.0000 mg | ORAL_TABLET | Freq: Two times a day (BID) | ORAL | Status: DC
  Administered 2020-03-19 – 2020-03-22 (×7): 1300 mg via ORAL
  Filled 2020-03-19 (×7): qty 2

## 2020-03-19 NOTE — Progress Notes (Signed)
[facility]  Date of Admission:  03/14/2020      Total days of antibiotics 6  Doxy + Unasyn 1/25 >> current    Zosyn 1/21 - 1/24  Linezolid 1/24 - 1/25  Vancomycin 1/22 - 1/23          ASSESSMENT: Katherine Cabrera is a 45 y.o. female with pelvic abscesses now s/p wash out from 1/24. She is improving clinically. No further fevers, WBc only mildly elevated (was neutropenic when she came in). Improving clinically and had a BM this morning and tolerating clamped NGT at the moment. JP drain with mostly serosanguinous drainage, some fibrinous debris noted in the bulb. Continue IV doxy + unasyn for the moment until she can take POs. Will plan to convert to oral doxycycline + augmentin to complete course of treatment. Duration pending given JP in place still with at least moderate output, although mostly thin/translucent fluid.   AKI - seems to have hit plateau and on the way down now.   S/P COVID infection recently - recovered and no further isolation needed.    PLAN: 1. Continue IV doxy and unasyn  2. Follow for JP output 3. Follow for readiness to convert to PO  4. Further recs to follow for duration     Principal Problem:   Sepsis (HCC) Active Problems:   Pelvic abscess in female   Transient hypotension   Enteritis   COVID-19 virus infection   Normocytic anemia   Type 2 diabetes mellitus with hyperlipidemia (HCC)   Intraabdominal fluid collection   S/P hysterectomy   . Chlorhexidine Gluconate Cloth  6 each Topical Daily  . enoxaparin (LOVENOX) injection  30 mg Subcutaneous Q24H  . influenza vac split quadrivalent PF  0.5 mL Intramuscular Tomorrow-1000  . insulin aspart  0-6 Units Subcutaneous TID WC    SUBJECTIVE: Feeling better. Having some ice chips. NGT is clamped and hopeful she will get it out this afternoon.   Getting foley out now. No new complaints. No nausea with abx.    Review of Systems: Review of Systems   Constitutional: Negative for chills and fever.  HENT: Negative for tinnitus.   Eyes: Negative for blurred vision and photophobia.  Respiratory: Negative for cough and sputum production.   Cardiovascular: Negative for chest pain.  Gastrointestinal: Positive for abdominal pain (mild at surgery site ). Negative for diarrhea, nausea and vomiting.  Genitourinary: Negative for dysuria.  Skin: Negative for rash.  Neurological: Negative for headaches.    No Known Allergies  OBJECTIVE: Vitals:   03/18/20 0414 03/18/20 1723 03/18/20 2248 03/19/20 0600  BP: 109/77 98/69 111/74 120/83  Pulse: 97 70 84 83  Resp:  16 18 18   Temp: 98.2 F (36.8 C) 98.3 F (36.8 C) 98.1 F (36.7 C) 98.7 F (37.1 C)  TempSrc: Oral     SpO2: 100% 100% 100% 99%  Weight:      Height:       Body mass index is 37.55 kg/m.  Physical Exam Constitutional:      Appearance: She is well-developed. She is obese.     Comments: Resting in bed ready to get foley out. Appears comfortable.   HENT:     Head:     Comments: NGT in place. Clamped. Some clear bilious drainage in canister from overnight  Cardiovascular:     Rate and Rhythm: Normal rate and regular rhythm.  Pulmonary:     Effort: Pulmonary effort  is normal.     Breath sounds: Normal breath sounds.  Abdominal:     Palpations: Abdomen is soft.  Skin:    General: Skin is warm and dry.  Neurological:     Mental Status: She is alert.     Lab Results Lab Results  Component Value Date   WBC 10.8 (H) 03/19/2020   HGB 12.1 03/19/2020   HCT 37.6 03/19/2020   MCV 80.7 03/19/2020   PLT 158 03/19/2020    Lab Results  Component Value Date   CREATININE 2.98 (H) 03/19/2020   BUN 47 (H) 03/19/2020   NA 143 03/19/2020   K 4.0 03/19/2020   CL 114 (H) 03/19/2020   CO2 17 (L) 03/19/2020    Lab Results  Component Value Date   ALT 22 03/18/2020   AST 35 03/18/2020   ALKPHOS 67 03/18/2020   BILITOT 1.1 03/18/2020     Microbiology: BCx 1/21 >> GS GPC  in chains, anaerobic bottle only, no growth yet   Rexene Alberts, MSN, NP-C [facility]  [email or web address] Pager: [phone] Office: [phone] [facility] Main Line: [phone]

## 2020-03-19 NOTE — Plan of Care (Signed)

## 2020-03-19 NOTE — Plan of Care (Signed)
?  Problem: Clinical Measurements: ?Goal: Respiratory complications will improve ?Outcome: Progressing ?  ?Problem: Clinical Measurements: ?Goal: Cardiovascular complication will be avoided ?Outcome: Progressing ?  ?Problem: Coping: ?Goal: Level of anxiety will decrease ?Outcome: Progressing ?  ?Problem: Pain Managment: ?Goal: General experience of comfort will improve ?Outcome: Progressing ?  ?Problem: Safety: ?Goal: Ability to remain free from injury will improve ?Outcome: Progressing ?  ?

## 2020-03-19 NOTE — Progress Notes (Signed)
[facility] Progress Note   Subjective:   Had BM last night, seems to be feeling a bit better I/Os yesterday 3.7 / 2.3, UOP 1.1, NG 1.0L  Labs this AM with BUN 47, Cr 2.98 from 39 / 3.05.   Objective Vitals:   03/18/20 0414 03/18/20 1723 03/18/20 2248 03/19/20 0600  BP: 109/77 98/69 111/74 120/83  Pulse: 97 70 84 83  Resp:  16 18 18   Temp: 98.2 F (36.8 C) 98.3 F (36.8 C) 98.1 F (36.7 C) 98.7 F (37.1 C)  TempSrc: Oral     SpO2: 100% 100% 100% 99%  Weight:      Height:       Physical Exam General: sitting up in bed Heart:RRR Lungs: clear Abdomen: soft, mod TTP; drain in place Extremities: no edema GU: foley with clear yellow urine  Additional Objective Labs: Basic Metabolic Panel: Recent Labs  Lab 03/16/20 0217 03/17/20 0930 03/18/20 0425 03/19/20 0345  NA 135 141 138 143  K 4.1 4.0 4.0 4.0  CL 104 108 109 114*  CO2 21*  --  16* 17*  GLUCOSE 127* 113* 178* 156*  BUN 20 30* 39* 47*  CREATININE 2.00* 2.80* 3.05* 2.98*  CALCIUM 7.4*  --  6.6* 6.6*   Liver Function Tests: Recent Labs  Lab 03/14/20 1920 03/18/20 0425  AST 18 35  ALT 16 22  ALKPHOS 61 67  BILITOT 0.2* 1.1  PROT 6.5 5.3*  ALBUMIN 3.1* 1.8*   Recent Labs  Lab 03/14/20 1920  LIPASE 22   CBC: Recent Labs  Lab 03/14/20 1920 03/15/20 1443 03/16/20 0217 03/17/20 0930 03/18/20 0425 03/19/20 0345  WBC 2.2*  --  9.2  --  12.0* 10.8*  NEUTROABS 2.0  --   --   --   --   --   HGB 11.1*   < > 10.3* 12.2 11.2* 12.1  HCT 33.9*   < > 32.0* 36.0 32.8* 37.6  MCV 81.9  --  81.6  --  79.4* 80.7  PLT 242  --  187  --  132* 158   < > = values in this interval not displayed.   Blood Culture    Component Value Date/Time   SDES BLOOD LEFT ANTECUBITAL 03/15/2020 1157   SPECREQUEST  03/15/2020 1157    BOTTLES DRAWN AEROBIC ONLY Blood Culture adequate volume   CULT  03/15/2020 1157    NO GROWTH 3 DAYS Performed at [facility], [address] [place], [place]  [place]    REPTSTATUS PENDING 03/15/2020 1157    Cardiac Enzymes: No results for input(s): CKTOTAL, CKMB, CKMBINDEX, TROPONINI in the last 168 hours. CBG: Recent Labs  Lab 03/18/20 0803 03/18/20 1143 03/18/20 1604 03/18/20 2250 03/19/20 0802  GLUCAP 144* 166* 170* 158* 127*   Iron Studies: No results for input(s): IRON, TIBC, TRANSFERRIN, FERRITIN in the last 72 hours. @lablastinr3 @ Studies/Results: No results found. Medications: . sodium chloride 125 mL/hr at 03/18/20 0750  . ampicillin-sulbactam (UNASYN) IV 3 g (03/19/20 0336)  . doxycycline (VIBRAMYCIN) IV 100 mg (03/19/20 0336)  . famotidine (PEPCID) IV 20 mg (03/18/20 0750)   . Chlorhexidine Gluconate Cloth  6 each Topical Daily  . enoxaparin (LOVENOX) injection  30 mg Subcutaneous Q24H  . influenza vac split quadrivalent PF  0.5 mL Intramuscular Tomorrow-1000  . insulin aspart  0-6 Units Subcutaneous TID WC    Assessment/Plan: **sepsis secondary to pelvic abscesses:  On antibiotic coverage and s/p surgical wash out.  Management  per primary.  I don't think she has AIN so no need to change abx at this time.   **AKI: Suspect multifactorial with hypovolemia, hypotension with concurrent ACEi, NSAID, contrast.  CT without evidence of obstruction; has foley catheter now. UA with small blood, suspect from foley, no protein.  Cont to hold ACEi, maintain euvolemia (continue MIVF for now given NPO), avoid nephrotoxins and hypotension as able.  Renal function is stable today and should improve.   **Metabolic acidosis: secondary to AKI; mild.  Follow, can supplement bicarb if not improving.    **HTN:  BP currently acceptable without meds.    **DM: well controlled, no metformin for now.   Will follow. Call with issues.   Estill Bakes MD 03/19/2020, 8:25 AM  [facility] Pager: [phone]

## 2020-03-19 NOTE — Evaluation (Signed)
Occupational Therapy Evaluation Patient Details Name: Katherine Cabrera MRN: [id number] DOB: 10-20-1975 Today's Date: 03/19/2020    History of Present Illness Katherine Cabrera is a 45 y.o. female admitted for management of pelvic abscess. Previously underwent hysterectomy and hernia repair in November 2021. Recover was going well and started noticing some bleeding. PMHx:  COVID 03/01/20, T2DM, hypotension.   Clinical Impression   Pt PTA: Pt independent with ADL and working; pt does not drive. Pt currently, minA overall for ADL and minguardA for mobility in room with no AD. Pt with fair balance at sink only at times requiring single UE for support. Pt would benefit from continued OT skilled services for assist with LB ADL. OT following acutely.      Follow Up Recommendations  No OT follow up;Supervision - Intermittent    Equipment Recommendations  3 in 1 bedside commode    Recommendations for Other Services       Precautions / Restrictions Precautions Precautions: Fall;Other (comment) Precaution Comments: NGT (clamped), JP drain Restrictions Weight Bearing Restrictions: No      Mobility Bed Mobility Overal bed mobility: Needs Assistance Bed Mobility: Supine to Sit;Sit to Supine     Supine to sit: Min assist Sit to supine: Min guard   General bed mobility comments: MinA to scoot to EOB    Transfers Overall transfer level: Needs assistance Equipment used: None Transfers: Sit to/from Stand Sit to Stand: Supervision         General transfer comment: supervisionA for safety    Balance Overall balance assessment: Mild deficits observed, not formally tested                                         ADL either performed or assessed with clinical judgement   ADL Overall ADL's : Needs assistance/impaired Eating/Feeding: Modified independent   Grooming: Supervision/safety;Standing   Upper Body Bathing: Supervision/ safety;Standing   Lower Body Bathing:  Minimal assistance;Sitting/lateral leans;Sit to/from stand   Upper Body Dressing : Supervision/safety;Standing   Lower Body Dressing: Minimal assistance;Sitting/lateral leans;Sit to/from stand   Toilet Transfer: Min guard   Toileting- Architect and Hygiene: Minimal assistance;Sitting/lateral lean;Sit to/from stand       Functional mobility during ADLs: Min guard General ADL Comments: Pt limited by pain, drains and decreased activity tolerance. Pt requiring increased assist for LB ADL.     Vision Baseline Vision/History: Wears glasses Wears Glasses: At all times Patient Visual Report: No change from baseline Vision Assessment?: No apparent visual deficits     Perception     Praxis      Pertinent Vitals/Pain Pain Assessment: Faces Faces Pain Scale: Hurts little more Pain Location: back, drains and NGT Pain Descriptors / Indicators: Discomfort Pain Intervention(s): Monitored during session;Repositioned     Hand Dominance Right   Extremity/Trunk Assessment Upper Extremity Assessment Upper Extremity Assessment: Generalized weakness   Lower Extremity Assessment Lower Extremity Assessment: Generalized weakness   Cervical / Trunk Assessment Cervical / Trunk Assessment: Normal   Communication Communication Communication: No difficulties   Cognition Arousal/Alertness: Awake/alert Behavior During Therapy: WFL for tasks assessed/performed Overall Cognitive Status: Within Functional Limits for tasks assessed                                     General Comments  Pt on RA >90% O2.  Exercises     Shoulder Instructions      Home Living Family/patient expects to be discharged to:: Private residence Living Arrangements: Spouse/significant other;Children Available Help at Discharge: Family;Available 24 hours/day Type of Home: House Home Access: Stairs to enter [employer] of Steps: 4 Entrance Stairs-Rails: Can reach both Home  Layout: One level     Bathroom Shower/Tub: Producer, television/film/video: Standard                Prior Functioning/Environment Level of Independence: Independent        Comments: does not drive; working        OT Problem List: Decreased strength;Decreased activity tolerance;Impaired balance (sitting and/or standing);Decreased safety awareness;Pain;Increased edema      OT Treatment/Interventions: Self-care/ADL training;Therapeutic exercise;Energy conservation;DME and/or AE instruction;Therapeutic activities;Patient/family education;Balance training    OT Goals(Current goals can be found in the care plan section) Acute Rehab OT Goals Patient Stated Goal: to get to feeling better OT Goal Formulation: With patient Time For Goal Achievement: 04/02/20 Potential to Achieve Goals: Good ADL Goals Pt Will Perform Lower Body Dressing: with supervision;sitting/lateral leans;sit to/from stand Pt Will Perform Toileting - Clothing Manipulation and hygiene: with supervision;sitting/lateral leans;sit to/from stand Additional ADL Goal #1: pt will tolerate x10 mins of OOB ADL with 2 seated rest break in order to increase independence with ADL.  OT Frequency: Min 2X/week   Barriers to D/C:            Co-evaluation              AM-PAC OT "6 Clicks" Daily Activity     Outcome Measure Help from another person eating meals?: None Help from another person taking care of personal grooming?: A Little Help from another person toileting, which includes using toliet, bedpan, or urinal?: A Little Help from another person bathing (including washing, rinsing, drying)?: A Little Help from another person to put on and taking off regular upper body clothing?: None Help from another person to put on and taking off regular lower body clothing?: A Little 6 Click Score: 20   End of Session Nurse Communication: Mobility status  Activity Tolerance: Patient limited by pain Patient left: in  bed;with call bell/phone within reach  OT Visit Diagnosis: Unsteadiness on feet (R26.81);Muscle weakness (generalized) (M62.81);Pain Pain - part of body:  (back and at drain sites)                Time: 9741-6384 OT Time Calculation (min): 21 min Charges:  OT General Charges $OT Visit: 1 Visit OT Evaluation $OT Eval Moderate Complexity: 1 Mod  Flora Lipps, OTR/L Acute Rehabilitation Services Pager: [phone] Office: [phone]   Katherine Cabrera C 03/19/2020, 5:45 PM

## 2020-03-19 NOTE — Progress Notes (Signed)
Tele called and said that pt. Was SVT 200's. Pt. Resting in bed, states she was moving around. Pt. Denies any any symptoms. BP: 103/72 HR:99 O2: 100 on room air, RR: 18 Temp: 98.3 axillary. MD paged and notified. New orders given. Gave meds. Will continue to monitor.

## 2020-03-19 NOTE — Progress Notes (Signed)
[facility] Progress Note  2 Days Post-Op  Subjective: CC-  Pain continues to improve. Reports bowel movement this morning. She has had about 3 cups of ice chips.   Objective: Vital signs in last 24 hours: Temp:  [98.1 F (36.7 C)-98.7 F (37.1 C)] 98.7 F (37.1 C) (01/26 0600) Pulse Rate:  [70-84] 83 (01/26 0600) Resp:  [16-18] 18 (01/26 0600) BP: (98-120)/(69-83) 120/83 (01/26 0600) SpO2:  [99 %-100 %] 99 % (01/26 0600) Last BM Date: 03/18/20  Intake/Output from previous day: 01/25 0701 - 01/26 0700 In: 3705.9 [P.O.:120; I.V.:2814.4; IV Piggyback:771.5] Out: 2290 [Urine:1115; Emesis/NG output:1050; Drains:125] Intake/Output this shift: No intake/output data recorded.  PE: Gen:  Alert, NAD, appears comfortable HEENT: EOM's intact, pupils equal and round Card:  Regular, 80s Pulm:  CTAB, no W/R/R, rate and effort normal Abd: Soft, minimally distended, nontender except around drain as expected. Incisions c/d/i. JP output serosanguinous, slightly murky. NG output clear/ light green. Urine output pale yellow and brisk in foley bag Skin: no rashes noted, warm and dry  Lab Results:  Recent Labs    03/18/20 0425 03/19/20 0345  WBC 12.0* 10.8*  HGB 11.2* 12.1  HCT 32.8* 37.6  PLT 132* 158   BMET Recent Labs    03/18/20 0425 03/19/20 0345  NA 138 143  K 4.0 4.0  CL 109 114*  CO2 16* 17*  GLUCOSE 178* 156*  BUN 39* 47*  CREATININE 3.05* 2.98*  CALCIUM 6.6* 6.6*   PT/INR No results for input(s): LABPROT, INR in the last 72 hours. CMP     Component Value Date/Time   NA 143 03/19/2020 0345   K 4.0 03/19/2020 0345   CL 114 (H) 03/19/2020 0345   CO2 17 (L) 03/19/2020 0345   GLUCOSE 156 (H) 03/19/2020 0345   BUN 47 (H) 03/19/2020 0345   CREATININE 2.98 (H) 03/19/2020 0345   CALCIUM 6.6 (L) 03/19/2020 0345   PROT 5.3 (L) 03/18/2020 0425   ALBUMIN 1.8 (L) 03/18/2020 0425   AST 35 03/18/2020 0425   ALT 22 03/18/2020 0425   ALKPHOS 67 03/18/2020  0425   BILITOT 1.1 03/18/2020 0425   GFRNONAA 19 (L) 03/19/2020 0345   GFRAA >60 09/26/2014 0850   Lipase     Component Value Date/Time   LIPASE 22 03/14/2020 1920       Studies/Results: No results found.  Anti-infectives: Anti-infectives (From admission, onward)   Start     Dose/Rate Route Frequency Ordered Stop   03/18/20 1400  doxycycline (VIBRAMYCIN) 100 mg in sodium chloride 0.9 % 250 mL IVPB        100 mg 125 mL/hr over 120 Minutes Intravenous Every 12 hours 03/18/20 1159     03/18/20 1400  Ampicillin-Sulbactam (UNASYN) 3 g in sodium chloride 0.9 % 100 mL IVPB        3 g 200 mL/hr over 30 Minutes Intravenous Every 12 hours 03/18/20 1201     03/17/20 2000  linezolid (ZYVOX) IVPB 600 mg  Status:  Discontinued        600 mg 300 mL/hr over 60 Minutes Intravenous Every 12 hours 03/17/20 1437 03/18/20 1159   03/17/20 1445  linezolid (ZYVOX) IVPB 600 mg  Status:  Discontinued        600 mg 300 mL/hr over 60 Minutes Intravenous Every 12 hours 03/17/20 1350 03/17/20 1437   03/17/20 1445  piperacillin-tazobactam (ZOSYN) IVPB 3.375 g  Status:  Discontinued        3.375 g 12.5  mL/hr over 240 Minutes Intravenous Every 8 hours 03/17/20 1437 03/18/20 1159   03/16/20 2300  vancomycin (VANCOCIN) IVPB 1000 mg/200 mL premix  Status:  Discontinued        1,000 mg 200 mL/hr over 60 Minutes Intravenous Every 24 hours 03/16/20 1347 03/17/20 1325   03/15/20 2300  vancomycin (VANCOCIN) IVPB 1000 mg/200 mL premix  Status:  Discontinued        1,000 mg 200 mL/hr over 60 Minutes Intravenous Every 12 hours 03/15/20 1028 03/16/20 1347   03/15/20 0830  vancomycin (VANCOCIN) IVPB 1000 mg/200 mL premix       "Followed by" Linked Group Details   1,000 mg 200 mL/hr over 60 Minutes Intravenous  Once 03/15/20 0722 03/15/20 0940   03/15/20 0800  piperacillin-tazobactam (ZOSYN) IVPB 3.375 g  Status:  Discontinued        3.375 g 12.5 mL/hr over 240 Minutes Intravenous Every 8 hours 03/15/20 0722  03/17/20 1437   03/15/20 0730  vancomycin (VANCOCIN) IVPB 1000 mg/200 mL premix       "Followed by" Linked Group Details   1,000 mg 200 mL/hr over 60 Minutes Intravenous  Once 03/15/20 0722 03/15/20 0835   03/14/20 2045  piperacillin-tazobactam (ZOSYN) IVPB 3.375 g        3.375 g 100 mL/hr over 30 Minutes Intravenous  Once 03/14/20 2032 03/14/20 2113       Assessment/Plan DM HLD 3 months s/p uncomplicated robotic hysterectomy and bilateral salpingectomy and umbilical hernia repair with biologic mesh 12/25/2019 Covid+ 03/01/20 AKI - Cr plateaud this AM 2.98 from baseline 0.67- BUN 47, concern for intrinsic renal on top of prerenal, appreciate nephrology care  Sepsis- blood cx GPC in chains- abx changed from linezolid/zosyn to unasyn/doxy on 1/25. Appreciate ID care.   Enteritis with free fluid and pelvic abscesses, etiology unclear -Fluid dissipated anteriorly and remaining pelvic collection was not accessible and therefore no interventional radiology drain could be placed 1/22 -Given history of bleeding and pain with intercourse after hysterectomy I am concerned that she could have a small vaginal cuff leak which could certainly explain the unusual timeline of events following her recent surgery -s/p dx lap, abdominal washout and drain placement. Could not elicit vaginal cuff leak but abscess in pelvis suggestive of the same. No evidence of bowel injury.   -She is much improved today ID - as above. WBC normalized, afebrile, tachycardia resolved FEN - IVF, NPO VTE - SCDs, lovenox Foley - continue  Continue JP to bulb suction, continue IV abx. Clamp NG today, possible remove this afternoon. Remove foley.  Mobilize. .    LOS: 4 days    Berna Bue, MD [facility] Surgery 03/19/2020, 9:01 AM Please see Amion for pager number during day hours 7:00am-4:30pm

## 2020-03-19 NOTE — Plan of Care (Signed)
  Problem: Education: Goal: Knowledge of General Education information will improve Description Including pain rating scale, medication(s)/side effects and non-pharmacologic comfort measures Outcome: Progressing   

## 2020-03-19 NOTE — Plan of Care (Signed)
  Problem: Education: Goal: Knowledge of General Education information will improve Description: Including pain rating scale, medication(s)/side effects and non-pharmacologic comfort measures 03/19/2020 1903 by Ross Ludwig, LPN Outcome: Progressing 03/19/2020 1042 by Ross Ludwig, LPN Outcome: Progressing

## 2020-03-19 NOTE — Progress Notes (Addendum)
PROGRESS NOTE    Katherine Cabrera  XAJ:[id number] DOB: 06-18-1975 DOA: 03/14/2020 PCP: Patient, No Pcp Per   Brief Narrative: 45 year old with past medical history significant for hypertension, hyperlipidemia, diabetes type 2, morbid obesity recent history of hysterectomy/bilateral 7 Pingel oophorectomy/umbilical hernia repair November 2021 at [facility] who presents with worsening abdominal pain up on imaging study found to have pelvic abscess.  The patient was taken to the OR on 1/24 for diagnostic laparoscopy and abdominal washout and lysis of inflammatory adhesions, drain placement, rigid sigmoidoscopy.  Intraoperative consult by Dr. Macon Large who performed a pelvic exam under anesthesia and instillation of vaginal vault with methylene blue.  No leak  was identified.  Patient was  receiving IV Zyvox and Zosyn.  ID  has been consulted, The patient has been changed over to Doxy and IV Unasyn.   Assessment & Plan:   Principal Problem:   Sepsis (HCC) Active Problems:   Pelvic abscess in female   Transient hypotension   Enteritis   COVID-19 virus infection   Normocytic anemia   Type 2 diabetes mellitus with hyperlipidemia (HCC)   Intraabdominal fluid collection   S/P hysterectomy  1-Sepsis Secondary to Pelvic Abscess and Enteritis -Patient presented with fever 103, tachycardia, tachypnea white blood cell 2.2 leukopenia. -CT peripheral enhancing fluid collection in the pelvis just above developing abscess and enteritis. -Underwent to the OR on 1/24 for diagnostic laparoscopy and abdominal washout and lysis of inflammatory adhesions, drain placement, rigid sigmoidoscopy.  Intraoperative consult by Dr. Macon Large who performed a pelvic exam under anesthesia and instillation of vaginal vault with methylene blue.  No leek  was identified. -Continue with IV doxy and Unasyn.  -follow ID antibiotics for length of treatment.  -surgery planning to remove NG tube today.   2-AKI;  suspect related to ACE, Hypotension, sepsis.  Improving with IV fluids.  Avoid nephrotoxins.   Normocytic anemia:  Follow trend.  Hb stable.   Hypertension; hold BP medications. SBP 100 range.  Holding lisinopril due to AKI.   SVT; will give IV bolus.  Electrolytes normal.  SBP in 100, for this reason wont start BB.   Hyperlipidemia: holding meds while NPO.   Diabetes type 2 SSI  Metabolic acidosis;  Started sodium Bicarb./   Calculated calcium for albumin; 8.0  Recent history of Covid infection: Diagnosed 12/31 Not require isolation currently.  Morbid obesity, estimated BMI 37.5   Estimated body mass index is 37.55 kg/m as calculated from the following:   Height as of this encounter: 5\' 3"  (1.6 m).   Weight as of this encounter: 96.2 kg.   DVT prophylaxis: Lovenox Code Status: Full code Family Communication: Care discussed with patient.  Disposition Plan:  Status is: Inpatient  Remains inpatient appropriate because:IV treatments appropriate due to intensity of illness or inability to take PO   Dispo: The patient is from: Home              Anticipated d/c is to: Home              Anticipated d/c date is: 3 days              Patient currently is not medically stable to d/c.   Difficult to place patient No        Consultants:   OB gynecology  General surgery  Interventional radiology  Procedures:   Placement abscess drain in pelvis by IR on 03/15/2020  Diagnostic laparoscopy, abdominal washout and lysis of inflammatory addition, drain  placement, rigid sigmoidoscopy   Antimicrobials:    Subjective: She denies dyspnea,. Chest pain.  Denies nausea. Had BM today   Objective: Vitals:   03/18/20 0414 03/18/20 1723 03/18/20 2248 03/19/20 0600  BP: 109/77 98/69 111/74 120/83  Pulse: 97 70 84 83  Resp:  16 18 18   Temp: 98.2 F (36.8 C) 98.3 F (36.8 C) 98.1 F (36.7 C) 98.7 F (37.1 C)  TempSrc: Oral     SpO2: 100% 100% 100% 99%  Weight:       Height:        Intake/Output Summary (Last 24 hours) at 03/19/2020 0731 Last data filed at 03/19/2020 03/21/2020 Gross per 24 hour  Intake 3645.91 ml  Output 2290 ml  Net 1355.91 ml   Filed Weights   03/14/20 1841 03/17/20 0926  Weight: 96.2 kg 96.2 kg    Examination:  General exam: Appears calm and comfortable  Respiratory system: Clear to auscultation. Respiratory effort normal. Cardiovascular system: S1 & S2 heard, RRR. No JVD, murmurs, rubs, gallops or clicks. No pedal edema. Gastrointestinal system: Abdomen is nondistended, soft and nontender. No organomegaly or masses felt. Normal bowel sounds heard. JP drain in lower quadrant.  Central nervous system: Alert and oriented. Follows command Extremities: Symmetric 5 x 5 power.    Data Reviewed: I have personally reviewed following labs and imaging studies  CBC: Recent Labs  Lab 03/14/20 1920 03/15/20 1443 03/16/20 0217 03/17/20 0930 03/18/20 0425 03/19/20 0345  WBC 2.2*  --  9.2  --  12.0* 10.8*  NEUTROABS 2.0  --   --   --   --   --   HGB 11.1* 11.6* 10.3* 12.2 11.2* 12.1  HCT 33.9* 36.6 32.0* 36.0 32.8* 37.6  MCV 81.9  --  81.6  --  79.4* 80.7  PLT 242  --  187  --  132* 158   Basic Metabolic Panel: Recent Labs  Lab 03/14/20 1920 03/16/20 0217 03/17/20 0930 03/18/20 0425 03/19/20 0345  NA 137 135 141 138 143  K 3.6 4.1 4.0 4.0 4.0  CL 102 104 108 109 114*  CO2 24 21*  --  16* 17*  GLUCOSE 126* 127* 113* 178* 156*  BUN 13 20 30* 39* 47*  CREATININE 0.67 2.00* 2.80* 3.05* 2.98*  CALCIUM 8.2* 7.4*  --  6.6* 6.6*  MG  --   --   --   --  1.9   GFR: Estimated Creatinine Clearance: 26.6 mL/min (A) (by C-G formula based on SCr of 2.98 mg/dL (H)). Liver Function Tests: Recent Labs  Lab 03/14/20 1920 03/18/20 0425  AST 18 35  ALT 16 22  ALKPHOS 61 67  BILITOT 0.2* 1.1  PROT 6.5 5.3*  ALBUMIN 3.1* 1.8*   Recent Labs  Lab 03/14/20 1920  LIPASE 22   No results for input(s): AMMONIA in the last 168  hours. Coagulation Profile: Recent Labs  Lab 03/16/20 0217  INR 1.7*   Cardiac Enzymes: No results for input(s): CKTOTAL, CKMB, CKMBINDEX, TROPONINI in the last 168 hours. BNP (last 3 results) No results for input(s): PROBNP in the last 8760 hours. HbA1C: No results for input(s): HGBA1C in the last 72 hours. CBG: Recent Labs  Lab 03/17/20 2048 03/18/20 0803 03/18/20 1143 03/18/20 1604 03/18/20 2250  GLUCAP 135* 144* 166* 170* 158*   Lipid Profile: No results for input(s): CHOL, HDL, LDLCALC, TRIG, CHOLHDL, LDLDIRECT in the last 72 hours. Thyroid Function Tests: No results for input(s): TSH, T4TOTAL, FREET4, T3FREE, THYROIDAB in  the last 72 hours. Anemia Panel: No results for input(s): VITAMINB12, FOLATE, FERRITIN, TIBC, IRON, RETICCTPCT in the last 72 hours. Sepsis Labs: Recent Labs  Lab 03/14/20 2207 03/15/20 1157  LATICACIDVEN 1.6 1.8    Recent Results (from the past 240 hour(s))  SARS Coronavirus 2 by RT PCR (hospital order, performed in [facility] hospital lab) Nasopharyngeal Peripheral     Status: Abnormal   Collection Time: 03/14/20  7:20 PM   Specimen: Peripheral; Nasopharyngeal  Result Value Ref Range Status   SARS Coronavirus 2 POSITIVE (A) NEGATIVE Final    Comment: RESULT CALLED TO, READ BACK BY AND VERIFIED WITH: NEAL KELLIE RN AT 2044 ON 03/14/20 BY I.[email or web address] (NOTE) SARS-CoV-2 target nucleic acids are DETECTED  SARS-CoV-2 RNA is generally detectable in upper respiratory specimens  during the acute phase of infection.  Positive results are indicative  of the presence of the identified virus, but do not rule out bacterial infection or co-infection with other pathogens not detected by the test.  Clinical correlation with patient history and  other diagnostic information is necessary to determine patient infection status.  The expected result is negative.  Fact Sheet for Patients:   BoilerBrush.com.cy   Fact Sheet for Healthcare  Providers:   https://pope.com/    This test is not yet approved or cleared by the [place] FDA and  has been authorized for detection and/or diagnosis of SARS-CoV-2 by FDA under an Emergency Use Authorization (EUA).  This EUA will remain in effect (meani ng this test can be used) for the duration of  the COVID-19 declaration under Section 564(b)(1) of the Act, 21 U.S.C. section 360-bbb-3(b)(1), unless the authorization is terminated or revoked sooner.  Performed at [facility], [address] [place], [place] [address]   Blood Culture (routine x 2)     Status: None (Preliminary result)   Collection Time: 03/14/20  7:30 PM   Specimen: BLOOD  Result Value Ref Range Status   Specimen Description   Final    BLOOD RIGHT ANTECUBITAL Performed at [facility], [address] [place], [place] [address]    Special Requests   Final    BOTTLES DRAWN AEROBIC AND ANAEROBIC Blood Culture adequate volume Performed at [facility], [address] [place], [place] [address]    Culture  Setup Time   Final    GRAM POSITIVE COCCI IN CHAINS ANAEROBIC BOTTLE ONLY CRITICAL RESULT CALLED TO, READ BACK BY AND VERIFIED WITH: A. Artis Flock PharmD 9:30 03/18/20 ([email or web address])    Culture   Final    NO GROWTH 3 DAYS Performed at [facility], [address], [place], [place] [address]    Report Status PENDING  Incomplete  Blood Culture ID Panel (Reflexed)     Status: None   Collection Time: 03/14/20  7:30 PM  Result Value Ref Range Status   Enterococcus faecalis NOT DETECTED NOT DETECTED Final   Enterococcus Faecium NOT DETECTED NOT DETECTED Final   Listeria monocytogenes NOT DETECTED NOT DETECTED Final   Staphylococcus species NOT DETECTED NOT DETECTED Final   Staphylococcus aureus (BCID) NOT DETECTED NOT DETECTED Final   Staphylococcus epidermidis NOT DETECTED NOT DETECTED Final   Staphylococcus lugdunensis NOT DETECTED NOT DETECTED Final    Streptococcus species NOT DETECTED NOT DETECTED Final   Streptococcus agalactiae NOT DETECTED NOT DETECTED Final   Streptococcus pneumoniae NOT DETECTED NOT DETECTED Final   Streptococcus pyogenes NOT DETECTED NOT DETECTED Final  A.calcoaceticus-baumannii NOT DETECTED NOT DETECTED Final   Bacteroides fragilis NOT DETECTED NOT DETECTED Final   Enterobacterales NOT DETECTED NOT DETECTED Final   Enterobacter cloacae complex NOT DETECTED NOT DETECTED Final   Escherichia coli NOT DETECTED NOT DETECTED Final   Klebsiella aerogenes NOT DETECTED NOT DETECTED Final   Klebsiella oxytoca NOT DETECTED NOT DETECTED Final   Klebsiella pneumoniae NOT DETECTED NOT DETECTED Final   Proteus species NOT DETECTED NOT DETECTED Final   Salmonella species NOT DETECTED NOT DETECTED Final   Serratia marcescens NOT DETECTED NOT DETECTED Final   Haemophilus influenzae NOT DETECTED NOT DETECTED Final   Neisseria meningitidis NOT DETECTED NOT DETECTED Final   Pseudomonas aeruginosa NOT DETECTED NOT DETECTED Final   Stenotrophomonas maltophilia NOT DETECTED NOT DETECTED Final   Candida albicans NOT DETECTED NOT DETECTED Final   Candida auris NOT DETECTED NOT DETECTED Final   Candida glabrata NOT DETECTED NOT DETECTED Final   Candida krusei NOT DETECTED NOT DETECTED Final   Candida parapsilosis NOT DETECTED NOT DETECTED Final   Candida tropicalis NOT DETECTED NOT DETECTED Final   Cryptococcus neoformans/gattii NOT DETECTED NOT DETECTED Final    Comment: Performed at [facility], [address], [place], [place] [address]  Blood Culture (routine x 2)     Status: None (Preliminary result)   Collection Time: 03/15/20 11:57 AM   Specimen: BLOOD  Result Value Ref Range Status   Specimen Description BLOOD LEFT ANTECUBITAL  Final   Special Requests   Final    BOTTLES DRAWN AEROBIC ONLY Blood Culture adequate volume   Culture   Final    NO GROWTH 3 DAYS Performed at [facility], [address]  [place], [place] [address]    Report Status PENDING  Incomplete         Radiology Studies: DG Abd Portable 1V  Result Date: 03/17/2020 CLINICAL DATA:  Abdominal pain EXAM: PORTABLE ABDOMEN - 1 VIEW COMPARISON:  CT abdomen and pelvis March 14, 2020 FINDINGS: There are loops of dilated small bowel without air-fluid levels. No free air. No abnormal calcifications. IMPRESSION: Dilated loops of small bowel raising concern for potential degree of bowel obstruction. Ileus and enteritis are differential considerations. Electronically Signed   By: Bretta Bang III M.D.   On: 03/17/2020 08:11        Scheduled Meds: . Chlorhexidine Gluconate Cloth  6 each Topical Daily  . enoxaparin (LOVENOX) injection  30 mg Subcutaneous Q24H  . influenza vac split quadrivalent PF  0.5 mL Intramuscular Tomorrow-1000  . insulin aspart  0-6 Units Subcutaneous TID WC   Continuous Infusions: . sodium chloride 125 mL/hr at 03/18/20 0750  . ampicillin-sulbactam (UNASYN) IV 3 g (03/19/20 0336)  . doxycycline (VIBRAMYCIN) IV 100 mg (03/19/20 0336)  . famotidine (PEPCID) IV 20 mg (03/18/20 0750)     LOS: 4 days    Time spent: 35 minutes.     Alba Cory, MD [facility]   If 7PM-7AM, please contact night-coverage [email or web address]  03/19/2020, 7:31 AM

## 2020-03-20 DIAGNOSIS — N739 Female pelvic inflammatory disease, unspecified: Secondary | ICD-10-CM | POA: Diagnosis not present

## 2020-03-20 DIAGNOSIS — D709 Neutropenia, unspecified: Secondary | ICD-10-CM | POA: Diagnosis not present

## 2020-03-20 DIAGNOSIS — R188 Other ascites: Secondary | ICD-10-CM | POA: Diagnosis not present

## 2020-03-20 DIAGNOSIS — D708 Other neutropenia: Secondary | ICD-10-CM | POA: Diagnosis not present

## 2020-03-20 LAB — BASIC METABOLIC PANEL
Anion gap: 13 (ref 5–15)
BUN: 41 mg/dL — ABNORMAL HIGH (ref 6–20)
CO2: 17 mmol/L — ABNORMAL LOW (ref 22–32)
Calcium: 6.3 mg/dL — CL (ref 8.9–10.3)
Chloride: 116 mmol/L — ABNORMAL HIGH (ref 98–111)
Creatinine, Ser: 2.64 mg/dL — ABNORMAL HIGH (ref 0.44–1.00)
GFR, Estimated: 22 mL/min — ABNORMAL LOW (ref >60)
Glucose, Bld: 117 mg/dL — ABNORMAL HIGH (ref 70–99)
Potassium: 3.9 mmol/L (ref 3.5–5.1)
Sodium: 146 mmol/L — ABNORMAL HIGH (ref 135–145)

## 2020-03-20 LAB — CBC
HCT: 32.7 % — ABNORMAL LOW (ref 36.0–46.0)
Hemoglobin: 11 g/dL — ABNORMAL LOW (ref 12.0–15.0)
MCH: 26.8 pg (ref 26.0–34.0)
MCHC: 33.6 g/dL (ref 30.0–36.0)
MCV: 79.8 fL — ABNORMAL LOW (ref 80.0–100.0)
Platelets: 157 10*3/uL (ref 150–400)
RBC: 4.1 MIL/uL (ref 3.87–5.11)
RDW: 15.2 % (ref 11.5–15.5)
WBC: 9.2 10*3/uL (ref 4.0–10.5)
nRBC: 0 % (ref 0.0–0.2)

## 2020-03-20 LAB — CULTURE, BLOOD (ROUTINE X 2)
Culture: NO GROWTH
Special Requests: ADEQUATE
Special Requests: ADEQUATE

## 2020-03-20 LAB — GLUCOSE, CAPILLARY
Glucose-Capillary: 123 mg/dL — ABNORMAL HIGH (ref 70–99)
Glucose-Capillary: 145 mg/dL — ABNORMAL HIGH (ref 70–99)
Glucose-Capillary: 210 mg/dL — ABNORMAL HIGH (ref 70–99)
Glucose-Capillary: 170 mg/dL — ABNORMAL HIGH (ref 70–99)

## 2020-03-20 LAB — MAGNESIUM: Magnesium: 1.8 mg/dL (ref 1.7–2.4)

## 2020-03-20 MED ORDER — SODIUM CHLORIDE 0.45 % IV SOLN: INTRAVENOUS | Status: DC

## 2020-03-20 MED ORDER — MAGNESIUM SULFATE 2 GM/50ML IV SOLN
2.0000 g | Freq: Once | INTRAVENOUS | Status: AC
  Administered 2020-03-20: 2 g via INTRAVENOUS
  Filled 2020-03-20: qty 50

## 2020-03-20 NOTE — Progress Notes (Signed)
Physical Therapy Evaluation Patient Details Name: Katherine Cabrera MRN: [id number] DOB: 1975/03/01 Today's Date: 03/20/2020   History of Present Illness  Katherine Cabrera is a 45 y.o. female admitted for management of pelvic abscess. Previously underwent hysterectomy and hernia repair in November 2021. Recover was going well and started noticing some bleeding. PMHx:  COVID 03/01/20, T2DM, hypotension.  Clinical Impression   Pt admitted with above diagnosis. Patient was independent prior to complications from surgery. She currently is experiencing pain 6/10 which is limiting her independence with all mobility.  Pt currently with functional limitations due to the deficits listed below (see PT Problem List). Pt will benefit from skilled PT to increase their independence and safety with mobility to allow discharge to the venue listed below.  Anticipate good progress.      Follow Up Recommendations No PT follow up;Supervision for mobility/OOB    Equipment Recommendations  Rolling walker with 5" wheels    Recommendations for Other Services       Precautions / Restrictions Precautions Precautions: Fall;Other (comment) Precaution Comments: JP drain      Mobility  Bed Mobility Overal bed mobility: Needs Assistance Bed Mobility: Supine to Sit     Supine to sit: Min guard     General bed mobility comments: HOB elevated and use of rail min guard assist; would need assist if HOB flat    Transfers Overall transfer level: Needs assistance Equipment used: None;Rolling walker (2 wheeled) Transfers: Sit to/from [employer] Sit to Stand: Min guard Stand pivot transfers: Min guard       General transfer comment: no device to pivot to recliner; to walk used RW  Ambulation/Gait Ambulation/Gait assistance: Land (Feet): 30 Feet Assistive device: Rolling walker (2 wheeled) Gait Pattern/deviations: Step-through pattern;Decreased stride length     General  Gait Details: slow due to pain and pain meds  Stairs            Wheelchair Mobility    Modified Rankin (Stroke Patients Only)       Balance Overall balance assessment: Mild deficits observed, not formally tested                                           Pertinent Vitals/Pain Pain Assessment: 0-10 Pain Score: 6  Pain Location: left side Pain Descriptors / Indicators: Discomfort Pain Intervention(s): Limited activity within patient's tolerance;Monitored during session;Premedicated before session;Repositioned    Home Living Family/patient expects to be discharged to:: Private residence Living Arrangements: Spouse/significant other;Children Available Help at Discharge: Family;Available 24 hours/day;Friend(s) (best friend is an Charity fundraiser) Type of Home: House Home Access: Stairs to enter Entrance Stairs-Rails: Can reach both Entrance Stairs-Number of Steps: 4 Home Layout: One level Home Equipment: None      Prior Function Level of Independence: Independent         Comments: does not drive; working     Hand Dominance   Dominant Hand: Right    Extremity/Trunk Assessment   Upper Extremity Assessment Upper Extremity Assessment: Generalized weakness    Lower Extremity Assessment Lower Extremity Assessment: Generalized weakness    Cervical / Trunk Assessment Cervical / Trunk Assessment: Normal  Communication   Communication: No difficulties  Cognition Arousal/Alertness: Awake/alert Behavior During Therapy: WFL for tasks assessed/performed Overall Cognitive Status: Within Functional Limits for tasks assessed  General Comments: slightly slow processing, likely med related      General Comments      Exercises General Exercises - Lower Extremity Ankle Circles/Pumps: AROM;Both;15 reps;Seated Long Arc Quad: AROM;Both;5 reps;Seated Heel Raises: AROM;Both;5 reps;Seated   Assessment/Plan    PT  Assessment Patient needs continued PT services  PT Problem List Decreased strength;Decreased activity tolerance;Decreased balance;Decreased mobility;Decreased knowledge of use of DME;Obesity;Pain       PT Treatment Interventions DME instruction;Gait training;Stair training;Functional mobility training;Therapeutic activities;Therapeutic exercise;Patient/family education    PT Goals (Current goals can be found in the Care Plan section)  Acute Rehab PT Goals Patient Stated Goal: to get to feeling better PT Goal Formulation: With patient Time For Goal Achievement: 04/03/20 Potential to Achieve Goals: Good    Frequency Min 3X/week   Barriers to discharge        Co-evaluation               AM-PAC PT "6 Clicks" Mobility  Outcome Measure Help needed turning from your back to your side while in a flat bed without using bedrails?: A Little Help needed moving from lying on your back to sitting on the side of a flat bed without using bedrails?: A Little Help needed moving to and from a bed to a chair (including a wheelchair)?: A Little Help needed standing up from a chair using your arms (e.g., wheelchair or bedside chair)?: A Little Help needed to walk in hospital room?: A Little Help needed climbing 3-5 steps with a railing? : A Little 6 Click Score: 18    End of Session   Activity Tolerance: Patient tolerated treatment well Patient left: in chair;with call bell/phone within reach (chair alarm pad under pt, no alarm box to plug into) Nurse Communication: Mobility status;Other (comment) (no alarm box) PT Visit Diagnosis: Pain;Difficulty in walking, not elsewhere classified (R26.2);Muscle weakness (generalized) (M62.81) Pain - Right/Left: Left Pain - part of body:  (side)    Time: 1343-1410 PT Time Calculation (min) (ACUTE ONLY): 27 min   Charges:   PT Evaluation $PT Eval Low Complexity: 1 Low PT Treatments $Therapeutic Activity: 8-22 mins         Jerolyn Center, PT Pager  [phone]   Zena Amos 03/20/2020, 2:28 PM

## 2020-03-20 NOTE — Plan of Care (Signed)

## 2020-03-20 NOTE — Progress Notes (Signed)
[facility] Progress Note   Subjective:   NG out, tol clear diet now.  No new issues.  UOP yesterday recorded.  Foley out - no difficulty voiding.   Objective Vitals:   03/19/20 0600 03/19/20 1400 03/19/20 2155 03/20/20 0521  BP: 120/83 103/72 127/83 123/84  Pulse: 83 99 84 89  Resp: 18 18 20 20   Temp: 98.7 F (37.1 C) 98.3 F (36.8 C) 98 F (36.7 C) 98.5 F (36.9 C)  TempSrc:  Axillary Oral Oral  SpO2: 99% 100% 95% 97%  Weight:      Height:       Physical Exam General: sitting up in bed Heart:RRR Lungs: clear Abdomen: soft, mod TTP; drain in place Extremities: no edema GU: foley out  Additional Objective Labs: Basic Metabolic Panel: Recent Labs  Lab 03/18/20 0425 03/19/20 0345 03/20/20 0456  NA 138 143 146*  K 4.0 4.0 3.9  CL 109 114* 116*  CO2 16* 17* 17*  GLUCOSE 178* 156* 117*  BUN 39* 47* 41*  CREATININE 3.05* 2.98* 2.64*  CALCIUM 6.6* 6.6* 6.3*   Liver Function Tests: Recent Labs  Lab 03/14/20 1920 03/18/20 0425  AST 18 35  ALT 16 22  ALKPHOS 61 67  BILITOT 0.2* 1.1  PROT 6.5 5.3*  ALBUMIN 3.1* 1.8*   Recent Labs  Lab 03/14/20 1920  LIPASE 22   CBC: Recent Labs  Lab 03/14/20 1920 03/15/20 1443 03/16/20 0217 03/17/20 0930 03/18/20 0425 03/19/20 0345 03/20/20 0456  WBC 2.2*  --  9.2  --  12.0* 10.8* 9.2  NEUTROABS 2.0  --   --   --   --   --   --   HGB 11.1*   < > 10.3*   < > 11.2* 12.1 11.0*  HCT 33.9*   < > 32.0*   < > 32.8* 37.6 32.7*  MCV 81.9  --  81.6  --  79.4* 80.7 79.8*  PLT 242  --  187  --  132* 158 157   < > = values in this interval not displayed.   Blood Culture    Component Value Date/Time   SDES BLOOD LEFT ANTECUBITAL 03/15/2020 1157   SPECREQUEST  03/15/2020 1157    BOTTLES DRAWN AEROBIC ONLY Blood Culture adequate volume   CULT  03/15/2020 1157    NO GROWTH 4 DAYS Performed at [facility], [address], [place], [place]    REPTSTATUS PENDING 03/15/2020 1157     Cardiac Enzymes: No results for input(s): CKTOTAL, CKMB, CKMBINDEX, TROPONINI in the last 168 hours. CBG: Recent Labs  Lab 03/19/20 1203 03/19/20 1627 03/19/20 2153 03/20/20 0816 03/20/20 1146  GLUCAP 111* 101* 98 123* 145*   Iron Studies: No results for input(s): IRON, TIBC, TRANSFERRIN, FERRITIN in the last 72 hours. @lablastinr3 @ Studies/Results: No results found. Medications: . sodium chloride 125 mL/hr at 03/20/20 0844  . ampicillin-sulbactam (UNASYN) IV 3 g (03/20/20 0137)  . doxycycline (VIBRAMYCIN) IV 100 mg (03/20/20 0214)  . famotidine (PEPCID) IV 20 mg (03/20/20 1006)   . Chlorhexidine Gluconate Cloth  6 each Topical Daily  . enoxaparin (LOVENOX) injection  30 mg Subcutaneous Q24H  . influenza vac split quadrivalent PF  0.5 mL Intramuscular Tomorrow-1000  . insulin aspart  0-6 Units Subcutaneous TID WC  . sodium bicarbonate  1,300 mg Oral BID    Assessment/Plan: **sepsis secondary to pelvic abscesses:  On antibiotic coverage and s/p surgical wash out.  Management per primary.  I don't think she has AIN so no need to change abx at this time.   **AKI: Suspect multifactorial with hypovolemia, hypotension with concurrent ACEi, NSAID, contrast.  Cr peak 3.  CT without evidence of obstruction.  Foley out and no voiding issues. UA with small blood, suspect from foley, no protein.  Cont to hold ACEi, maintain euvolemia (continue MIVF for now given po intake not robust; hopefully can stop tomorrow.), avoid nephrotoxins and hypotension as able.  Renal function is improved to 2.6 today  **Metabolic acidosis: secondary to AKI; mild.  Follow, can supplement bicarb if not improving.    **HTN:  BP currently acceptable without meds.    **DM: well controlled, no metformin for now.   **HypoCa:  Corrects to 8.1.   Will follow. Call with issues.   Estill Bakes MD 03/20/2020, 11:52 AM  [facility] Pager: [phone]

## 2020-03-20 NOTE — Discharge Instructions (Addendum)
[facility]  Please arrive at least 30 min before your appointment to complete your check in paperwork.  If you are unable to arrive 30 min prior to your appointment time we may have to cancel or reschedule you. LAPAROSCOPIC SURGERY: POST OP INSTRUCTIONS Always review your discharge instruction sheet given to you by the facility where your surgery was performed. IF YOU HAVE DISABILITY OR FAMILY LEAVE FORMS, YOU MUST BRING THEM TO THE OFFICE FOR PROCESSING.   DO NOT GIVE THEM TO YOUR DOCTOR.  PAIN CONTROL  1. First take acetaminophen (Tylenol) AND/or ibuprofen (Advil) to control your pain after surgery.  Follow directions on package.  Taking acetaminophen (Tylenol) and/or ibuprofen (Advil) regularly after surgery will help to control your pain and lower the amount of prescription pain medication you may need.  You should not take more than 4,000 mg (4 grams) of acetaminophen (Tylenol) in 24 hours.  You should not take ibuprofen (Advil), aleve, motrin, naprosyn or other NSAIDS if you have a history of stomach ulcers or chronic kidney disease.  2. A prescription for pain medication may be given to you upon discharge.  Take your pain medication as prescribed, if you still have uncontrolled pain after taking acetaminophen (Tylenol) or ibuprofen (Advil). 3. Use ice packs to help control pain. 4. If you need a refill on your pain medication, please contact your pharmacy.  They will contact our office to request authorization. Prescriptions will not be filled after 5pm or on week-ends.  HOME MEDICATIONS 5. Take your usually prescribed medications unless otherwise directed.  DIET 6. You should follow a light diet the first few days after arrival home.  Be sure to include lots of fluids daily. Avoid fatty, fried foods.   CONSTIPATION 7. It is common to experience some constipation after surgery and if you are taking pain medication.  Increasing fluid intake and taking a stool  softener (such as Colace) will usually help or prevent this problem from occurring.  A mild laxative (Milk of Magnesia or Miralax) should be taken according to package instructions if there are no bowel movements after 48 hours.  WOUND/INCISION CARE 8. Most patients will experience some swelling and bruising in the area of the incisions.  Ice packs will help.  Swelling and bruising can take several days to resolve.  9. Unless discharge instructions indicate otherwise, follow guidelines below  a. STERI-STRIPS - you may remove your outer bandages 48 hours after surgery, and you may shower at that time.  You have steri-strips (small skin tapes) in place directly over the incision.  These strips should be left on the skin for 7-10 days.   b. DERMABOND/SKIN GLUE - you may shower in 24 hours.  The glue will flake off over the next 2-3 weeks. 10. Any sutures or staples will be removed at the office during your follow-up visit.  ACTIVITIES 11. You may resume regular (light) daily activities beginning the next day--such as daily self-care, walking, climbing stairs--gradually increasing activities as tolerated.  You may have sexual intercourse when it is comfortable.  Refrain from any heavy lifting or straining until approved by your doctor. a. You may drive when you are no longer taking prescription pain medication, you can comfortably wear a seatbelt, and you can safely maneuver your car and apply brakes.  FOLLOW-UP 12. You should see your doctor in the office for a follow-up appointment approximately 2-3 weeks after your surgery.  You should have been given your post-op/follow-up appointment when   your surgery was scheduled.  If you did not receive a post-op/follow-up appointment, make sure that you call for this appointment within a day or two after you arrive home to insure a convenient appointment time.  OTHER INSTRUCTIONS  WHEN TO CALL YOUR DOCTOR: 1. Fever over 101.0 2. Inability to  urinate 3. Continued bleeding from incision. 4. Increased pain, redness, or drainage from the incision. 5. Increasing abdominal pain  The clinic staff is available to answer your questions during regular business hours.  Please don't hesitate to call and ask to speak to one of the nurses for clinical concerns.  If you have a medical emergency, go to the nearest emergency room or call 911.  A surgeon from [facility] is always on call at the hospital. [address], [address], [place], [place]  [address] ? [address] [address], [place], [place]   [address] [phone] ? [phone] ? FAX [phone]     Surgical Drain Home Care Surgical drains are used to remove extra fluid that normally builds up in a surgical wound after surgery. A surgical drain helps to heal a surgical wound. Different kinds of surgical drains include:  Active drains. These drains use suction to pull drainage away from the surgical wound. Drainage flows through a tube to a container outside of the body. With these drains, you need to keep the bulb or the drainage container flat (compressed) at all times, except while you empty it. Flattening the bulb or container creates suction.  Passive drains. These drains allow fluid to drain naturally, by gravity. Drainage flows through a tube to a bandage (dressing) or a container outside of the body. Passive drains do not need to be emptied. A drain is placed during surgery. Right after surgery, drainage is usually bright red and a little thicker than water. The drainage may gradually turn yellow or pink and become thinner. It is likely that your health care provider will remove the drain when the drainage stops or when the amount decreases to 1-2 Tbsp (15-30 mL) during a 24-hour period. Supplies needed:  Tape.  Germ-free cleaning solution (sterile saline).  Cotton swabs.  Split gauze drain sponge: 4 x 4 inches (10 x 10 cm).  Gauze square: 4 x 4 inches (10 x 10  cm). How to care for your surgical drain Care for your drain as told by your health care provider. This is important to help prevent infection. If your drain is placed at your back, or any other hard-to-reach area, ask another person to assist you in performing the following tasks: General care  Keep the skin around the drain dry and covered with a dressing at all times.  Check your drain area every day for signs of infection. Check for: ? Redness, swelling, or pain. ? Pus or a bad smell. ? Cloudy drainage. ? Tenderness or pressure at the drain exit site. Changing the dressing Follow instructions from your health care provider about how to change your dressing. Change your dressing at least once a day. Change it more often if needed to keep the dressing dry. Make sure you: 1. Gather your supplies. 2. Wash your hands with soap and water before you change your dressing. If soap and water are not available, use hand sanitizer. 3. Remove the old dressing. Avoid using scissors to do that. 4. Wash your hands with soap and water again after removing the old dressing. 5. Use sterile saline to clean your skin around the drain. You may   need to use a cotton swab to clean the skin. 6. Place the tube through the slit in a drain sponge. Place the drain sponge so that it covers your wound. 7. Place the gauze square or another drain sponge on top of the drain sponge that is on the wound. Make sure the tube is between those layers. 8. Tape the dressing to your skin. 9. Tape the drainage tube to your skin 1-2 inches (2.5-5 cm) below the place where the tube enters your body. Taping keeps the tube from pulling on any stitches (sutures) that you have. 10. Wash your hands with soap and water. 11. Write down the color of your drainage and how often you change your dressing. How to empty your active drain 1. Make sure that you have a measuring cup that you can empty your drainage into. 2. Wash your hands with  soap and water. If soap and water are not available, use hand sanitizer. 3. Loosen any pins or clips that hold the tube in place. 4. If your health care provider tells you to strip the tube to prevent clots and tube blockages: ? Hold the tube at the skin with one hand. Use your other hand to pinch the tubing with your thumb and first finger. ? Gently move your fingers down the tube while squeezing very lightly. This clears any drainage, clots, or tissue from the tube. ? You may need to do this several times each day to keep the tube clear. Do not pull on the tube. 5. Open the bulb cap or the drain plug. Do not touch the inside of the cap or the bottom of the plug. 6. Turn the device upside down and gently squeeze. 7. Empty all of the drainage into the measuring cup. 8. Compress the bulb or the container and replace the cap or the plug. To compress the bulb or the container, squeeze it firmly in the middle while you close the cap or plug the container. 9. Write down the amount of drainage that you have in each 24-hour period. If you have less than 2 Tbsp (30 mL) of drainage during 24 hours, contact your health care provider. 10. Flush the drainage down the toilet. 11. Wash your hands with soap and water.   Contact a health care provider if:  You have redness, swelling, or pain around your drain area.  You have pus or a bad smell coming from your drain area.  You have a fever or chills.  The skin around your drain is warm to the touch.  The amount of drainage that you have is increasing instead of decreasing.  You have drainage that is cloudy.  There is a sudden stop or a sudden decrease in the amount of drainage that you have.  Your drain tube falls out.  Your active drain does not stay compressed after you empty it. Summary  Surgical drains are used to remove extra fluid that normally builds up in a surgical wound after surgery.  Different kinds of surgical drains include active  drains and passive drains. Active drains use suction to pull drainage away from the surgical wound, and passive drains allow fluid to drain naturally.  It is important to care for your drain to prevent infection. If your drain is placed at your back, or any other hard-to-reach area, ask another person to assist you.  Contact your health care provider if you have redness, swelling, or pain around your drain area. This information is not intended   to replace advice given to you by your health care provider. Make sure you discuss any questions you have with your health care provider. Document Revised: 03/15/2018 Document Reviewed: 03/15/2018 Elsevier Patient Education  2021 [employer]  

## 2020-03-20 NOTE — Progress Notes (Signed)
Progress Note  3 Days Post-Op  Subjective: Patient reports tolerating NGT being clamped overnight and having bowel function. She reports abdomen is sore but pain is well controlled. She has been ambulating in room to bathroom and sat up in chair yesterday. She has had some leakage around drain.   Objective: Vital signs in last 24 hours: Temp:  [98 F (36.7 C)-98.5 F (36.9 C)] 98.5 F (36.9 C) (01/27 0521) Pulse Rate:  [84-99] 89 (01/27 0521) Resp:  [18-20] 20 (01/27 0521) BP: (103-127)/(72-84) 123/84 (01/27 0521) SpO2:  [95 %-100 %] 97 % (01/27 0521) Last BM Date: 03/18/20  Intake/Output from previous day: 01/26 0701 - 01/27 0700 In: -  Out: 452 [Urine:300; Drains:152] Intake/Output this shift: No intake/output data recorded.  PE: General: pleasant, WD, obese female who is laying in bed in NAD Heart: regular, rate, and rhythm.   Lungs: CTAB, no wheezes, rhonchi, or rales noted.  Respiratory effort nonlabored Abd: soft, appropriately ttp, mildly distended, +BS, drain with SS fluid, incisions c/d/i MS: all 4 extremities are symmetrical with no cyanosis, clubbing, or edema. Skin: warm and dry with no masses, lesions, or rashes Neuro: Cranial nerves 2-12 grossly intact, sensation is normal throughout Psych: A&Ox3 with an appropriate affect.    Lab Results:  Recent Labs    03/19/20 0345 03/20/20 0456  WBC 10.8* 9.2  HGB 12.1 11.0*  HCT 37.6 32.7*  PLT 158 157   BMET Recent Labs    03/19/20 0345 03/20/20 0456  NA 143 146*  K 4.0 3.9  CL 114* 116*  CO2 17* 17*  GLUCOSE 156* 117*  BUN 47* 41*  CREATININE 2.98* 2.64*  CALCIUM 6.6* 6.3*   PT/INR No results for input(s): LABPROT, INR in the last 72 hours. CMP     Component Value Date/Time   NA 146 (H) 03/20/2020 0456   K 3.9 03/20/2020 0456   CL 116 (H) 03/20/2020 0456   CO2 17 (L) 03/20/2020 0456   GLUCOSE 117 (H) 03/20/2020 0456   BUN 41 (H) 03/20/2020 0456   CREATININE 2.64 (H) 03/20/2020 0456    CALCIUM 6.3 (LL) 03/20/2020 0456   PROT 5.3 (L) 03/18/2020 0425   ALBUMIN 1.8 (L) 03/18/2020 0425   AST 35 03/18/2020 0425   ALT 22 03/18/2020 0425   ALKPHOS 67 03/18/2020 0425   BILITOT 1.1 03/18/2020 0425   GFRNONAA 22 (L) 03/20/2020 0456   GFRAA >60 09/26/2014 0850   Lipase     Component Value Date/Time   LIPASE 22 03/14/2020 1920       Studies/Results: No results found.  Anti-infectives: Anti-infectives (From admission, onward)   Start     Dose/Rate Route Frequency Ordered Stop   03/18/20 1400  doxycycline (VIBRAMYCIN) 100 mg in sodium chloride 0.9 % 250 mL IVPB        100 mg 125 mL/hr over 120 Minutes Intravenous Every 12 hours 03/18/20 1159     03/18/20 1400  Ampicillin-Sulbactam (UNASYN) 3 g in sodium chloride 0.9 % 100 mL IVPB        3 g 200 mL/hr over 30 Minutes Intravenous Every 12 hours 03/18/20 1201     03/17/20 2000  linezolid (ZYVOX) IVPB 600 mg  Status:  Discontinued        600 mg 300 mL/hr over 60 Minutes Intravenous Every 12 hours 03/17/20 1437 03/18/20 1159   03/17/20 1445  linezolid (ZYVOX) IVPB 600 mg  Status:  Discontinued        600 mg 300  mL/hr over 60 Minutes Intravenous Every 12 hours 03/17/20 1350 03/17/20 1437   03/17/20 1445  piperacillin-tazobactam (ZOSYN) IVPB 3.375 g  Status:  Discontinued        3.375 g 12.5 mL/hr over 240 Minutes Intravenous Every 8 hours 03/17/20 1437 03/18/20 1159   03/16/20 2300  vancomycin (VANCOCIN) IVPB 1000 mg/200 mL premix  Status:  Discontinued        1,000 mg 200 mL/hr over 60 Minutes Intravenous Every 24 hours 03/16/20 1347 03/17/20 1325   03/15/20 2300  vancomycin (VANCOCIN) IVPB 1000 mg/200 mL premix  Status:  Discontinued        1,000 mg 200 mL/hr over 60 Minutes Intravenous Every 12 hours 03/15/20 1028 03/16/20 1347   03/15/20 0830  vancomycin (VANCOCIN) IVPB 1000 mg/200 mL premix       "Followed by" Linked Group Details   1,000 mg 200 mL/hr over 60 Minutes Intravenous  Once 03/15/20 0722 03/15/20 0940    03/15/20 0800  piperacillin-tazobactam (ZOSYN) IVPB 3.375 g  Status:  Discontinued        3.375 g 12.5 mL/hr over 240 Minutes Intravenous Every 8 hours 03/15/20 0722 03/17/20 1437   03/15/20 0730  vancomycin (VANCOCIN) IVPB 1000 mg/200 mL premix       "Followed by" Linked Group Details   1,000 mg 200 mL/hr over 60 Minutes Intravenous  Once 03/15/20 0722 03/15/20 0835   03/14/20 2045  piperacillin-tazobactam (ZOSYN) IVPB 3.375 g        3.375 g 100 mL/hr over 30 Minutes Intravenous  Once 03/14/20 2032 03/14/20 2113       Assessment/Plan DM HLD 3 months s/p uncomplicated robotic hysterectomy and bilateral salpingectomy and umbilical hernia repair with biologic mesh 12/25/2019 Covid+ 03/01/20 AKI - Cr 2.68 this AM from baseline 0.67- BUN 47, concern for intrinsic renal on top of prerenal, appreciate nephrology care  Sepsis- blood cx GPC in chains- abx changed from linezolid/zosyn to unasyn/doxy on 1/25. Appreciate ID care.   Enteritis with free fluid and pelvic abscesses, etiology unclear - Fluid dissipated anteriorly and remaining pelvic collection was not accessible and therefore no interventional radiology drain could be placed 1/22 - Given history of bleeding and pain with intercourse after hysterectomy I am concerned that she could have a small vaginal cuff leak which could certainly explain the unusual timeline of events following her recent surgery - s/p dx lap, abdominal washout and drain placement. Could not elicit vaginal cuff leak but abscess in pelvis suggestive of the same. No evidence of bowel injury.   - tolerated NGT clamped overnight, removed today and will initiate CLD - mobilize as tolerated - drain with SS fluid but 150 cc out in last 24h, will watch output but hopefully can remove prior to discharge  ID - as above. WBC normalized, afebrile, tachycardia resolved FEN - IVF, NPO VTE - SCDs, lovenox Foley - continue  LOS: 5 days    Juliet Rude , [facility] 03/20/2020, 8:30 AM Please see Amion for pager number during day hours 7:00am-4:30pm

## 2020-03-20 NOTE — Progress Notes (Signed)
PROGRESS NOTE    Katherine Cabrera  BWG:[id number] DOB: 1975/10/03 DOA: 03/14/2020 PCP: Patient, No Pcp Per   Brief Narrative: 45 year old with past medical history significant for hypertension, hyperlipidemia, diabetes type 2, morbid obesity recent history of hysterectomy/bilateral 7 Pingel oophorectomy/umbilical hernia repair November 2021 at [facility] who presents with worsening abdominal pain up on imaging study found to have pelvic abscess.  The patient was taken to the OR on 1/24 for diagnostic laparoscopy and abdominal washout and lysis of inflammatory adhesions, drain placement, rigid sigmoidoscopy.  Intraoperative consult by Dr. Macon Large who performed a pelvic exam under anesthesia and instillation of vaginal vault with methylene blue.  No leak  was identified.  Patient was  receiving IV Zyvox and Zosyn.  ID  has been consulted, The patient has been changed over to Doxy and IV Unasyn.   Assessment & Plan:   Principal Problem:   Sepsis (HCC) Active Problems:   Pelvic abscess in female   Transient hypotension   Enteritis   COVID-19 virus infection   Normocytic anemia   Type 2 diabetes mellitus with hyperlipidemia (HCC)   Intraabdominal fluid collection   S/P hysterectomy  1-Sepsis Secondary to Pelvic Abscess and Enteritis -Patient presented with fever 103, tachycardia, tachypnea white blood cell 2.2 leukopenia. -CT peripheral enhancing fluid collection in the pelvis just above developing abscess and enteritis. -Underwent to the OR on 1/24 for diagnostic laparoscopy and abdominal washout and lysis of inflammatory adhesions, drain placement, rigid sigmoidoscopy.  Intraoperative consult by Dr. Macon Large who performed a pelvic exam under anesthesia and instillation of vaginal vault with methylene blue.  No leek  was identified. -Continue with IV doxy and Unasyn.  -follow ID antibiotics for length of treatment.  -NG tube removed. Started on clears.  -Continue with  drain in place.   2-AKI; suspect related to ACE, Hypotension, sepsis.  Improving with IV fluids.  Avoid nephrotoxins.   Normocytic anemia:  Follow trend.  Hb stable.   Hypertension; hold BP medications. SBP 100 range.  Holding lisinopril due to AKI.   SVT; received IV bolus Electrolytes normal.  SBP in 100, for this reason wont start BB.   Hyperlipidemia: holding meds while NPO.   Diabetes type 2 SSI  Metabolic acidosis;  Started sodium Bicarb./   Calculated calcium for albumin; 8.0  Recent history of Covid infection: Diagnosed 12/31 Not require isolation currently.  Morbid obesity, estimated BMI 37.5   Estimated body mass index is 37.55 kg/m as calculated from the following:   Height as of this encounter: 5\' 3"  (1.6 m).   Weight as of this encounter: 96.2 kg.   DVT prophylaxis: Lovenox Code Status: Full code Family Communication: Care discussed with patient.  Disposition Plan:  Status is: Inpatient  Remains inpatient appropriate because:IV treatments appropriate due to intensity of illness or inability to take PO   Dispo: The patient is from: Home              Anticipated d/c is to: Home              Anticipated d/c date is: 3 days              Patient currently is not medically stable to d/c.   Difficult to place patient No        Consultants:   OB gynecology  General surgery  Interventional radiology  Procedures:   Placement abscess drain in pelvis by IR on 03/15/2020  Diagnostic laparoscopy, abdominal washout and lysis of  inflammatory addition, drain placement, rigid sigmoidoscopy   Antimicrobials:    Subjective: She report mild abdominal soreness. Tolerating clears.  Denies chest pain.   Objective: Vitals:   03/19/20 0600 03/19/20 1400 03/19/20 2155 03/20/20 0521  BP: 120/83 103/72 127/83 123/84  Pulse: 83 99 84 89  Resp: 18 18 20 20   Temp: 98.7 F (37.1 C) 98.3 F (36.8 C) 98 F (36.7 C) 98.5 F (36.9 C)  TempSrc:   Axillary Oral Oral  SpO2: 99% 100% 95% 97%  Weight:      Height:        Intake/Output Summary (Last 24 hours) at 03/20/2020 1232 Last data filed at 03/20/2020 0139 Gross per 24 hour  Intake -  Output 112 ml  Net -112 ml   Filed Weights   03/14/20 1841 03/17/20 0926  Weight: 96.2 kg 96.2 kg    Examination:  General exam: NAD Respiratory system: CTA Cardiovascular system: S 1, S 2 RRR Gastrointestinal system: BS present, soft, nt JP drain in lower quadrant.  Central nervous system: alert, follows command.  Extremities: Symmetric power    Data Reviewed: I have personally reviewed following labs and imaging studies  CBC: Recent Labs  Lab 03/14/20 1920 03/15/20 1443 03/16/20 0217 03/17/20 0930 03/18/20 0425 03/19/20 0345 03/20/20 0456  WBC 2.2*  --  9.2  --  12.0* 10.8* 9.2  NEUTROABS 2.0  --   --   --   --   --   --   HGB 11.1*   < > 10.3* 12.2 11.2* 12.1 11.0*  HCT 33.9*   < > 32.0* 36.0 32.8* 37.6 32.7*  MCV 81.9  --  81.6  --  79.4* 80.7 79.8*  PLT 242  --  187  --  132* 158 157   < > = values in this interval not displayed.   Basic Metabolic Panel: Recent Labs  Lab 03/14/20 1920 03/16/20 0217 03/17/20 0930 03/18/20 0425 03/19/20 0345 03/20/20 0456  NA 137 135 141 138 143 146*  K 3.6 4.1 4.0 4.0 4.0 3.9  CL 102 104 108 109 114* 116*  CO2 24 21*  --  16* 17* 17*  GLUCOSE 126* 127* 113* 178* 156* 117*  BUN 13 20 30* 39* 47* 41*  CREATININE 0.67 2.00* 2.80* 3.05* 2.98* 2.64*  CALCIUM 8.2* 7.4*  --  6.6* 6.6* 6.3*  MG  --   --   --   --  1.9 1.8   GFR: Estimated Creatinine Clearance: 30 mL/min (A) (by C-G formula based on SCr of 2.64 mg/dL (H)). Liver Function Tests: Recent Labs  Lab 03/14/20 1920 03/18/20 0425  AST 18 35  ALT 16 22  ALKPHOS 61 67  BILITOT 0.2* 1.1  PROT 6.5 5.3*  ALBUMIN 3.1* 1.8*   Recent Labs  Lab 03/14/20 1920  LIPASE 22   No results for input(s): AMMONIA in the last 168 hours. Coagulation Profile: Recent Labs   Lab 03/16/20 0217  INR 1.7*   Cardiac Enzymes: No results for input(s): CKTOTAL, CKMB, CKMBINDEX, TROPONINI in the last 168 hours. BNP (last 3 results) No results for input(s): PROBNP in the last 8760 hours. HbA1C: No results for input(s): HGBA1C in the last 72 hours. CBG: Recent Labs  Lab 03/19/20 1203 03/19/20 1627 03/19/20 2153 03/20/20 0816 03/20/20 1146  GLUCAP 111* 101* 98 123* 145*   Lipid Profile: No results for input(s): CHOL, HDL, LDLCALC, TRIG, CHOLHDL, LDLDIRECT in the last 72 hours. Thyroid Function Tests: No results for input(s):  TSH, T4TOTAL, FREET4, T3FREE, THYROIDAB in the last 72 hours. Anemia Panel: No results for input(s): VITAMINB12, FOLATE, FERRITIN, TIBC, IRON, RETICCTPCT in the last 72 hours. Sepsis Labs: Recent Labs  Lab 03/14/20 2207 03/15/20 1157  LATICACIDVEN 1.6 1.8    Recent Results (from the past 240 hour(s))  SARS Coronavirus 2 by RT PCR (hospital order, performed in [facility] [facility] hospital lab) Nasopharyngeal Peripheral     Status: Abnormal   Collection Time: 03/14/20  7:20 PM   Specimen: Peripheral; Nasopharyngeal  Result Value Ref Range Status   SARS Coronavirus 2 POSITIVE (A) NEGATIVE Final    Comment: RESULT CALLED TO, READ BACK BY AND VERIFIED WITH: NEAL KELLIE RN AT 2044 ON 03/14/20 BY [email or web address] (NOTE) SARS-CoV-2 target nucleic acids are DETECTED  SARS-CoV-2 RNA is generally detectable in upper respiratory specimens  during the acute phase of infection.  Positive results are indicative  of the presence of the identified virus, but do not rule out bacterial infection or co-infection with other pathogens not detected by the test.  Clinical correlation with patient history and  other diagnostic information is necessary to determine patient infection status.  The expected result is negative.  Fact Sheet for Patients:   BoilerBrush.com.cy   Fact Sheet for Healthcare Providers:    https://pope.com/    This test is not yet approved or cleared by the [place] FDA and  has been authorized for detection and/or diagnosis of SARS-CoV-2 by FDA under an Emergency Use Authorization (EUA).  This EUA will remain in effect (meani ng this test can be used) for the duration of  the COVID-19 declaration under Section 564(b)(1) of the Act, 21 U.S.C. section 360-bbb-3(b)(1), unless the authorization is terminated or revoked sooner.  Performed at [facility], [address] [place], [place] [address]   Blood Culture (routine x 2)     Status: Abnormal   Collection Time: 03/14/20  7:30 PM   Specimen: BLOOD  Result Value Ref Range Status   Specimen Description   Final    BLOOD RIGHT ANTECUBITAL Performed at [facility], [address] [place], [place] [address]    Special Requests   Final    BOTTLES DRAWN AEROBIC AND ANAEROBIC Blood Culture adequate volume Performed at [facility], [address] [place], [place] [address]    Culture  Setup Time   Final    GRAM POSITIVE COCCI IN CHAINS ANAEROBIC BOTTLE ONLY CRITICAL RESULT CALLED TO, READ BACK BY AND VERIFIED WITH: A. Artis Flock PharmD 9:30 03/18/20 ([email or web address])    Culture (A)  Final    PEPTOSTREPTOCOCCUS MICROS Standardized susceptibility testing for this organism is not available. Performed at [facility], [address], [place], [place] [address]    Report Status 03/20/2020 FINAL  Final  Blood Culture ID Panel (Reflexed)     Status: None   Collection Time: 03/14/20  7:30 PM  Result Value Ref Range Status   Enterococcus faecalis NOT DETECTED NOT DETECTED Final   Enterococcus Faecium NOT DETECTED NOT DETECTED Final   Listeria monocytogenes NOT DETECTED NOT DETECTED Final   Staphylococcus species NOT DETECTED NOT DETECTED Final   Staphylococcus aureus (BCID) NOT DETECTED NOT DETECTED Final   Staphylococcus epidermidis NOT DETECTED NOT DETECTED Final    Staphylococcus lugdunensis NOT DETECTED NOT DETECTED Final   Streptococcus species NOT DETECTED NOT DETECTED Final   Streptococcus agalactiae NOT DETECTED NOT DETECTED Final   Streptococcus pneumoniae NOT DETECTED NOT DETECTED  Final   Streptococcus pyogenes NOT DETECTED NOT DETECTED Final   A.calcoaceticus-baumannii NOT DETECTED NOT DETECTED Final   Bacteroides fragilis NOT DETECTED NOT DETECTED Final   Enterobacterales NOT DETECTED NOT DETECTED Final   Enterobacter cloacae complex NOT DETECTED NOT DETECTED Final   Escherichia coli NOT DETECTED NOT DETECTED Final   Klebsiella aerogenes NOT DETECTED NOT DETECTED Final   Klebsiella oxytoca NOT DETECTED NOT DETECTED Final   Klebsiella pneumoniae NOT DETECTED NOT DETECTED Final   Proteus species NOT DETECTED NOT DETECTED Final   Salmonella species NOT DETECTED NOT DETECTED Final   Serratia marcescens NOT DETECTED NOT DETECTED Final   Haemophilus influenzae NOT DETECTED NOT DETECTED Final   Neisseria meningitidis NOT DETECTED NOT DETECTED Final   Pseudomonas aeruginosa NOT DETECTED NOT DETECTED Final   Stenotrophomonas maltophilia NOT DETECTED NOT DETECTED Final   Candida albicans NOT DETECTED NOT DETECTED Final   Candida auris NOT DETECTED NOT DETECTED Final   Candida glabrata NOT DETECTED NOT DETECTED Final   Candida krusei NOT DETECTED NOT DETECTED Final   Candida parapsilosis NOT DETECTED NOT DETECTED Final   Candida tropicalis NOT DETECTED NOT DETECTED Final   Cryptococcus neoformans/gattii NOT DETECTED NOT DETECTED Final    Comment: Performed at [facility], [address], [place], [place] [address]  Blood Culture (routine x 2)     Status: None (Preliminary result)   Collection Time: 03/15/20 11:57 AM   Specimen: BLOOD  Result Value Ref Range Status   Specimen Description BLOOD LEFT ANTECUBITAL  Final   Special Requests   Final    BOTTLES DRAWN AEROBIC ONLY Blood Culture adequate volume   Culture   Final    NO GROWTH 4  DAYS Performed at [facility], [address], [place], [place] [address]    Report Status PENDING  Incomplete         Radiology Studies: No results found.      Scheduled Meds: . Chlorhexidine Gluconate Cloth  6 each Topical Daily  . enoxaparin (LOVENOX) injection  30 mg Subcutaneous Q24H  . influenza vac split quadrivalent PF  0.5 mL Intramuscular Tomorrow-1000  . insulin aspart  0-6 Units Subcutaneous TID WC  . sodium bicarbonate  1,300 mg Oral BID   Continuous Infusions: . sodium chloride 125 mL/hr at 03/20/20 0844  . ampicillin-sulbactam (UNASYN) IV 3 g (03/20/20 0137)  . doxycycline (VIBRAMYCIN) IV 100 mg (03/20/20 0214)  . famotidine (PEPCID) IV 20 mg (03/20/20 1006)     LOS: 5 days    Time spent: 35 minutes.     Alba Cory, MD [facility]   If 7PM-7AM, please contact night-coverage [email or web address]  03/20/2020, 12:32 PM

## 2020-03-20 NOTE — Progress Notes (Signed)
    [facility]    Date of Admission:  03/14/2020     ID: Francesa Eugenio is a 45 y.o. female with  Pelvic abscess presumably from vaginal cuff tear post hysterectomy Principal Problem:   Sepsis (HCC) Active Problems:   Pelvic abscess in female   Transient hypotension   Enteritis   COVID-19 virus infection   Normocytic anemia   Type 2 diabetes mellitus with hyperlipidemia (HCC)   Intraabdominal fluid collection   S/P hysterectomy    Subjective: Afebrile. Sitting up in chair without diffculty. Starting to take clear liquids  ROS:12 point negative, mild abdominal pain  Medications:  . Chlorhexidine Gluconate Cloth  6 each Topical Daily  . enoxaparin (LOVENOX) injection  30 mg Subcutaneous Q24H  . influenza vac split quadrivalent PF  0.5 mL Intramuscular Tomorrow-1000  . insulin aspart  0-6 Units Subcutaneous TID WC  . sodium bicarbonate  1,300 mg Oral BID    Objective: Vital signs in last 24 hours: Temp:  [97.6 F (36.4 C)-98.5 F (36.9 C)] 97.6 F (36.4 C) (01/27 1600) Pulse Rate:  [84-89] 87 (01/27 1600) Resp:  [20] 20 (01/27 0521) BP: (123-138)/(83-89) 138/89 (01/27 1600) SpO2:  [95 %-100 %] 100 % (01/27 1600) Physical Exam  Constitutional:  oriented to person, place, and time. appears well-developed and well-nourished. No distress.  HENT: [place]/AT, PERRLA, no scleral icterus Mouth/Throat: Oropharynx is clear and moist. No oropharyngeal exudate.  Cardiovascular: Normal rate, regular rhythm and normal heart sounds. Exam reveals no gallop and no friction rub.  No murmur heard.  Pulmonary/Chest: Effort normal and breath sounds normal. No respiratory distress.  has no wheezes.  Neck = supple, no nuchal rigidity Abdominal: Soft. Bowel sounds are normal.  exhibits no distension. There is no tenderness. Drain in place Lymphadenopathy: no cervical adenopathy. No axillary adenopathy Neurological: alert and oriented to person, place, and time.  Skin:  Skin is warm and dry. No rash noted. No erythema.  Psychiatric: a normal mood and affect.  behavior is normal.    Lab Results Recent Labs    03/19/20 0345 03/20/20 0456  WBC 10.8* 9.2  HGB 12.1 11.0*  HCT 37.6 32.7*  NA 143 146*  K 4.0 3.9  CL 114* 116*  CO2 17* 17*  BUN 47* 41*  CREATININE 2.98* 2.64*   Liver Panel Recent Labs    03/18/20 0425  PROT 5.3*  ALBUMIN 1.8*  AST 35  ALT 22  ALKPHOS 67  BILITOT 1.1   Sedimentation Rate No results for input(s): ESRSEDRATE in the last 72 hours. C-Reactive Protein No results for input(s): CRP in the last 72 hours.  Microbiology: reveiwed Studies/Results: No results found.   Assessment/Plan: Aylin Rhoads is a 45 y.o. female with pelvic abscesses now s/p wash out from 1/24. She is improving clinically. No further fevers, WBc only mildly elevated (was neutropenic when she came in). Improving clinically and had a BM this and diet has moved up to clears.  JP drain with mostly serosanguinous drainage.  - will plan to continue with IV amp/sub plus IV doxy, then plan to switch to oral regimen of amox/clav plus oral doxy once she can tolerate taking pills.     Duration pending given JP in place still with at least moderate output, although mostly thin/translucent flu.   [facility] for Infectious Diseases Cell: [phone] Pager: [phone]  03/20/2020, 5:58 PM

## 2020-03-21 DIAGNOSIS — N739 Female pelvic inflammatory disease, unspecified: Secondary | ICD-10-CM

## 2020-03-21 DIAGNOSIS — K529 Noninfective gastroenteritis and colitis, unspecified: Secondary | ICD-10-CM

## 2020-03-21 DIAGNOSIS — R188 Other ascites: Secondary | ICD-10-CM

## 2020-03-21 DIAGNOSIS — D709 Neutropenia, unspecified: Secondary | ICD-10-CM | POA: Diagnosis not present

## 2020-03-21 DIAGNOSIS — D708 Other neutropenia: Secondary | ICD-10-CM | POA: Diagnosis not present

## 2020-03-21 DIAGNOSIS — U071 COVID-19: Secondary | ICD-10-CM

## 2020-03-21 DIAGNOSIS — Z90710 Acquired absence of both cervix and uterus: Secondary | ICD-10-CM

## 2020-03-21 LAB — CBC
HCT: 32.4 % — ABNORMAL LOW (ref 36.0–46.0)
Hemoglobin: 10.8 g/dL — ABNORMAL LOW (ref 12.0–15.0)
MCH: 26.6 pg (ref 26.0–34.0)
MCHC: 33.3 g/dL (ref 30.0–36.0)
MCV: 79.8 fL — ABNORMAL LOW (ref 80.0–100.0)
Platelets: 263 10*3/uL (ref 150–400)
RBC: 4.06 MIL/uL (ref 3.87–5.11)
RDW: 15.3 % (ref 11.5–15.5)
WBC: 14.1 10*3/uL — ABNORMAL HIGH (ref 4.0–10.5)
nRBC: 0 % (ref 0.0–0.2)

## 2020-03-21 LAB — RENAL FUNCTION PANEL
Albumin: 1.8 g/dL — ABNORMAL LOW (ref 3.5–5.0)
Anion gap: 10 (ref 5–15)
BUN: 26 mg/dL — ABNORMAL HIGH (ref 6–20)
CO2: 17 mmol/L — ABNORMAL LOW (ref 22–32)
Calcium: 6.5 mg/dL — ABNORMAL LOW (ref 8.9–10.3)
Chloride: 114 mmol/L — ABNORMAL HIGH (ref 98–111)
Creatinine, Ser: 2.27 mg/dL — ABNORMAL HIGH (ref 0.44–1.00)
GFR, Estimated: 27 mL/min — ABNORMAL LOW (ref >60)
Glucose, Bld: 161 mg/dL — ABNORMAL HIGH (ref 70–99)
Phosphorus: 2.8 mg/dL (ref 2.5–4.6)
Potassium: 3.6 mmol/L (ref 3.5–5.1)
Sodium: 141 mmol/L (ref 135–145)

## 2020-03-21 LAB — GLUCOSE, CAPILLARY
Glucose-Capillary: 128 mg/dL — ABNORMAL HIGH (ref 70–99)
Glucose-Capillary: 181 mg/dL — ABNORMAL HIGH (ref 70–99)
Glucose-Capillary: 147 mg/dL — ABNORMAL HIGH (ref 70–99)

## 2020-03-21 MED ORDER — DOXYCYCLINE HYCLATE 100 MG PO TABS
100.0000 mg | ORAL_TABLET | Freq: Two times a day (BID) | ORAL | Status: DC
  Administered 2020-03-21 – 2020-03-23 (×4): 100 mg via ORAL
  Filled 2020-03-21 (×4): qty 1

## 2020-03-21 MED ORDER — AMOXICILLIN-POT CLAVULANATE 875-125 MG PO TABS
1.0000 | ORAL_TABLET | Freq: Two times a day (BID) | ORAL | Status: DC
  Administered 2020-03-21 – 2020-03-23 (×5): 1 via ORAL
  Filled 2020-03-21 (×5): qty 1

## 2020-03-21 NOTE — Progress Notes (Signed)
Progress Note  4 Days Post-Op  Subjective: Tolerating clears without nausea, passing flatus but not much bowel movements.  Pain continues to improve, is mostly centered around the drain site.  Objective: Vital signs in last 24 hours: Temp:  [97.1 F (36.2 C)-98.6 F (37 C)] 97.1 F (36.2 C) (01/28 0736) Pulse Rate:  [79-90] 79 (01/28 0736) Resp:  [15-20] 15 (01/28 0736) BP: (119-138)/(80-89) 122/80 (01/28 0736) SpO2:  [100 %] 100 % (01/28 0736) Last BM Date: 03/18/20  Intake/Output from previous day: 01/27 0701 - 01/28 0700 In: -  Out: 170 [Drains:170] Intake/Output this shift: Total I/O In: -  Out: 48 [Drains:48]  PE: General: pleasant, WD, obese female who is laying in bed in NAD Heart: regular, rate, and rhythm.   Lungs: CTAB, no wheezes, rhonchi, or rales noted.  Respiratory effort nonlabored Abd: soft, appropriately ttp, mildly distended, +BS, drain with SS fluid, incisions c/d/i MS: all 4 extremities are symmetrical with no cyanosis, clubbing, or edema. Skin: warm and dry with no masses, lesions, or rashes Neuro: Cranial nerves 2-12 grossly intact, sensation is normal throughout Psych: A&Ox3 with an appropriate affect.    Lab Results:  Recent Labs    03/19/20 0345 03/20/20 0456  WBC 10.8* 9.2  HGB 12.1 11.0*  HCT 37.6 32.7*  PLT 158 157   BMET Recent Labs    03/19/20 0345 03/20/20 0456  NA 143 146*  K 4.0 3.9  CL 114* 116*  CO2 17* 17*  GLUCOSE 156* 117*  BUN 47* 41*  CREATININE 2.98* 2.64*  CALCIUM 6.6* 6.3*   PT/INR No results for input(s): LABPROT, INR in the last 72 hours. CMP     Component Value Date/Time   NA 146 (H) 03/20/2020 0456   K 3.9 03/20/2020 0456   CL 116 (H) 03/20/2020 0456   CO2 17 (L) 03/20/2020 0456   GLUCOSE 117 (H) 03/20/2020 0456   BUN 41 (H) 03/20/2020 0456   CREATININE 2.64 (H) 03/20/2020 0456   CALCIUM 6.3 (LL) 03/20/2020 0456   PROT 5.3 (L) 03/18/2020 0425   ALBUMIN 1.8 (L) 03/18/2020 0425   AST 35  03/18/2020 0425   ALT 22 03/18/2020 0425   ALKPHOS 67 03/18/2020 0425   BILITOT 1.1 03/18/2020 0425   GFRNONAA 22 (L) 03/20/2020 0456   GFRAA >60 09/26/2014 0850   Lipase     Component Value Date/Time   LIPASE 22 03/14/2020 1920       Studies/Results: No results found.  Anti-infectives: Anti-infectives (From admission, onward)   Start     Dose/Rate Route Frequency Ordered Stop   03/18/20 1400  doxycycline (VIBRAMYCIN) 100 mg in sodium chloride 0.9 % 250 mL IVPB        100 mg 125 mL/hr over 120 Minutes Intravenous Every 12 hours 03/18/20 1159     03/18/20 1400  Ampicillin-Sulbactam (UNASYN) 3 g in sodium chloride 0.9 % 100 mL IVPB        3 g 200 mL/hr over 30 Minutes Intravenous Every 12 hours 03/18/20 1201     03/17/20 2000  linezolid (ZYVOX) IVPB 600 mg  Status:  Discontinued        600 mg 300 mL/hr over 60 Minutes Intravenous Every 12 hours 03/17/20 1437 03/18/20 1159   03/17/20 1445  linezolid (ZYVOX) IVPB 600 mg  Status:  Discontinued        600 mg 300 mL/hr over 60 Minutes Intravenous Every 12 hours 03/17/20 1350 03/17/20 1437   03/17/20 1445  piperacillin-tazobactam (  ZOSYN) IVPB 3.375 g  Status:  Discontinued        3.375 g 12.5 mL/hr over 240 Minutes Intravenous Every 8 hours 03/17/20 1437 03/18/20 1159   03/16/20 2300  vancomycin (VANCOCIN) IVPB 1000 mg/200 mL premix  Status:  Discontinued        1,000 mg 200 mL/hr over 60 Minutes Intravenous Every 24 hours 03/16/20 1347 03/17/20 1325   03/15/20 2300  vancomycin (VANCOCIN) IVPB 1000 mg/200 mL premix  Status:  Discontinued        1,000 mg 200 mL/hr over 60 Minutes Intravenous Every 12 hours 03/15/20 1028 03/16/20 1347   03/15/20 0830  vancomycin (VANCOCIN) IVPB 1000 mg/200 mL premix       "Followed by" Linked Group Details   1,000 mg 200 mL/hr over 60 Minutes Intravenous  Once 03/15/20 0722 03/15/20 0940   03/15/20 0800  piperacillin-tazobactam (ZOSYN) IVPB 3.375 g  Status:  Discontinued        3.375 g 12.5  mL/hr over 240 Minutes Intravenous Every 8 hours 03/15/20 0722 03/17/20 1437   03/15/20 0730  vancomycin (VANCOCIN) IVPB 1000 mg/200 mL premix       "Followed by" Linked Group Details   1,000 mg 200 mL/hr over 60 Minutes Intravenous  Once 03/15/20 0722 03/15/20 0835   03/14/20 2045  piperacillin-tazobactam (ZOSYN) IVPB 3.375 g        3.375 g 100 mL/hr over 30 Minutes Intravenous  Once 03/14/20 2032 03/14/20 2113       Assessment/Plan DM HLD 3 months s/p uncomplicated robotic hysterectomy and bilateral salpingectomy and umbilical hernia repair with biologic mesh 12/25/2019 Covid+ 03/01/20 AKI - Cr 2.68 yesterday from baseline 0.67- BUN 47, concern for intrinsic renal on top of prerenal, appreciate nephrology care  Sepsis- blood cx GPC in chains- abx changed from linezolid/zosyn to unasyn/doxy on 1/25. Appreciate ID care.   Enteritis with free fluid and pelvic abscesses, etiology unclear - Fluid dissipated anteriorly and remaining pelvic collection was not accessible and therefore no interventional radiology drain could be placed 1/22 - Given history of bleeding and pain with intercourse after hysterectomy I am concerned that she could have a small vaginal cuff leak which could certainly explain the unusual timeline of events following her recent surgery - s/p dx lap, abdominal washout and drain placement. Could not elicit vaginal cuff leak but abscess in pelvis suggestive of the same. No evidence of bowel injury.   -Advance to soft diet - mobilize as tolerated - drain with SS fluid but 170 cc out in last 24h, will keep in place for now If tolerating a soft diet and able to transition to oral antibiotics (will defer to ID), potential discharge this evening or tomorrow.  ID - as above. WBC normalized, afebrile, tachycardia resolved.  FEN - IVF, soft diet VTE - SCDs, lovenox Foley -removed  LOS: 6 days    Berna Bue , MD [facility] Surgery 03/21/2020, 9:18 AM Please see  Amion for pager number during day hours 7:00am-4:30pm

## 2020-03-21 NOTE — Progress Notes (Signed)
    [facility]    Date of Admission:  03/14/2020   Total days of antibiotics day 8          ID: Katherine Cabrera is a 45 y.o. female with  Principal Problem:   Sepsis (HCC) Active Problems:   Pelvic abscess in female   Transient hypotension   Enteritis   COVID-19 virus infection   Normocytic anemia   Type 2 diabetes mellitus with hyperlipidemia (HCC)   Intraabdominal fluid collection   S/P hysterectomy    Subjective: Afebrile, starting to get up on her own and work with PT/OT. JP drain had overnight    Medications:  . amoxicillin-clavulanate  1 tablet Oral Q12H  . Chlorhexidine Gluconate Cloth  6 each Topical Daily  . doxycycline  100 mg Oral Q12H  . enoxaparin (LOVENOX) injection  30 mg Subcutaneous Q24H  . influenza vac split quadrivalent PF  0.5 mL Intramuscular Tomorrow-1000  . insulin aspart  0-6 Units Subcutaneous TID WC  . sodium bicarbonate  1,300 mg Oral BID    Objective: Vital signs in last 24 hours: Temp:  [97.1 F (36.2 C)-98.6 F (37 C)] 97.1 F (36.2 C) (01/28 0736) Pulse Rate:  [79-90] 79 (01/28 0736) Resp:  [15-20] 15 (01/28 0736) BP: (119-124)/(80-87) 122/80 (01/28 0736) SpO2:  [100 %] 100 % (01/28 0736) Physical Exam  Constitutional:  oriented to person, place, and time. appears well-developed and well-nourished. No distress.  HENT: [place]/AT, PERRLA, no scleral icterus Mouth/Throat: Oropharynx is clear and moist. No oropharyngeal exudate.  Cardiovascular: Normal rate, regular rhythm and normal heart sounds. Exam reveals no gallop and no friction rub.  No murmur heard.  Pulmonary/Chest: Effort normal and breath sounds normal. No respiratory distress.  has no wheezes.  Abdominal: Soft. Bowel sounds are normal.  exhibits no distension. There is no tenderness. Left sided JP drain with serosanginous fluid Skin: Skin is warm and dry. No rash noted. No erythema.  Psychiatric: a normal mood and affect.  behavior is normal.     Lab Results Recent Labs    03/19/20 0345 03/20/20 0456  WBC 10.8* 9.2  HGB 12.1 11.0*  HCT 37.6 32.7*  NA 143 146*  K 4.0 3.9  CL 114* 116*  CO2 17* 17*  BUN 47* 41*  CREATININE 2.98* 2.64*    Microbiology: 1/21 - peptostreptococcus - 1/22 - blood cx ngtd Studies/Results: No results found.   Assessment/Plan: Polymicrobial abscess with secondary bacteremia with peptostreptococcal bacteremia = will switch to amox/clav 875mg  BID plus doxy 100mg  PO BID on full stomach. And would give anti-emetic to premedicate if needed. Plan to treat for addn 14 days given still high amount of drainage from JP drain and bacteremia.  We will see back in the ID office in 10 days.  Will sign off  [facility] Cell: [phone] Pager: [phone]  03/21/2020, 4:09 PM

## 2020-03-21 NOTE — Progress Notes (Signed)
I stopped by but wasn't able to see patient as she was in bathroom VSS, I/Os not documented  Labs were unable to be obtained today.  I will look at labs tomorrow to confirm her AKI from ATN/contrast continues to improve.  It's fine for her to go home from my perspective as long as she's tolerating good po intake.  I would have her ACEi held until back to baseline.

## 2020-03-21 NOTE — Plan of Care (Signed)

## 2020-03-21 NOTE — Progress Notes (Signed)
Occupational Therapy Treatment Patient Details Name: Katherine Cabrera MRN: [id number] DOB: [birth year] Today's Date: 03/21/2020    History of present illness Katherine Cabrera is a 45 y.o. female admitted for management of pelvic abscess. Previously underwent hysterectomy and hernia repair in November 2021. Recover was going well and started noticing some bleeding. PMHx:  COVID 03/01/20, T2DM, hypotension.   OT comments  Pt progressing towards established OT goals. Focused session on increased activity tolerance and providing education on compensatory techniques for LB ADLs. Pt performing toileting, LB bathing, and grooming with Supervision. Pt unable to perform figure four method to don underwear; providing education on use of reacher for LB dressing. Pt donning underwear with Min A for reacher management and Mod cues for sequencing. Pt performing functional mobility in hallway with supervision and RW. continue to recommend dc to home once medically stable per physician. Will continue to follow acutely as admitted.    Follow Up Recommendations  No OT follow up;Supervision - Intermittent    Equipment Recommendations  3 in 1 bedside commode    Recommendations for Other Services      Precautions / Restrictions Precautions Precautions: Fall;Other (comment) Precaution Comments: JP drain       Mobility Bed Mobility Overal bed mobility: Needs Assistance Bed Mobility: Supine to Sit     Supine to sit: HOB elevated;Supervision     General bed mobility comments: Increased time and effort. Supervision for safety  Transfers Overall transfer level: Needs assistance Equipment used: None Transfers: Sit to/from Stand Sit to Stand: Supervision         General transfer comment: Supervision for safety    Balance Overall balance assessment: Mild deficits observed, not formally tested                                         ADL either performed or assessed with clinical  judgement   ADL Overall ADL's : Needs assistance/impaired     Grooming: Wash/dry hands;Supervision/safety;Standing;Wash/dry face       Lower Body Bathing: Sit to/from stand;Supervison/ safety Lower Body Bathing Details (indicate cue type and reason): Supervision for safety while pt performed LB bathing and peri care at sink     Lower Body Dressing: With adaptive equipment;Cueing for sequencing;Sit to/from stand;Minimal assistance Lower Body Dressing Details (indicate cue type and reason): providing education on LB dressing with use of reacher. Pt donning underwear with Min A for managing reacher and Mod cues for sequencing. Toilet Transfer: Supervision/safety;Ambulation;Regular Toilet;Grab bars Toilet Transfer Details (indicate cue type and reason): Supervision for safety and cues to use grab bar Toileting- Clothing Manipulation and Hygiene: Supervision/safety;Sit to/from stand       Functional mobility during ADLs: Min guard (with and without RW) General ADL Comments: Pt performing toileting, bathing at sink, grooming, LB dressing with AE, and functional mobility     Vision       Perception     Praxis      Cognition Arousal/Alertness: Awake/alert Behavior During Therapy: WFL for tasks assessed/performed Overall Cognitive Status: Within Functional Limits for tasks assessed                                 General Comments: Slower processing and needing increased time        Exercises     Shoulder Instructions  General Comments VSS on RA    Pertinent Vitals/ Pain       Pain Assessment: Faces Faces Pain Scale: Hurts a little bit Pain Location: JP drain site; soreness Pain Descriptors / Indicators: Sore Pain Intervention(s): Monitored during session;Limited activity within patient's tolerance;Repositioned  Home Living                                          Prior Functioning/Environment              Frequency  Min  2X/week        Progress Toward Goals  OT Goals(current goals can now be found in the care plan section)  Progress towards OT goals: Progressing toward goals  Acute Rehab OT Goals Patient Stated Goal: to get to feeling better OT Goal Formulation: With patient Time For Goal Achievement: 04/02/20 Potential to Achieve Goals: Good ADL Goals Pt Will Perform Lower Body Dressing: with supervision;sitting/lateral leans;sit to/from stand Pt Will Perform Toileting - Clothing Manipulation and hygiene: with supervision;sitting/lateral leans;sit to/from stand Additional ADL Goal #1: pt will tolerate x10 mins of OOB ADL with 2 seated rest break in order to increase independence with ADL.  Plan Discharge plan remains appropriate    Co-evaluation                 AM-PAC OT "6 Clicks" Daily Activity     Outcome Measure   Help from another person eating meals?: None Help from another person taking care of personal grooming?: A Little Help from another person toileting, which includes using toliet, bedpan, or urinal?: A Little Help from another person bathing (including washing, rinsing, drying)?: A Little Help from another person to put on and taking off regular upper body clothing?: None Help from another person to put on and taking off regular lower body clothing?: A Little 6 Click Score: 20    End of Session    OT Visit Diagnosis: Unsteadiness on feet (R26.81);Muscle weakness (generalized) (M62.81);Pain Pain - part of body:  (Drain site)   Activity Tolerance Patient tolerated treatment well   Patient Left with call bell/phone within reach;in chair   Nurse Communication Mobility status        Time: 0017-4944 OT Time Calculation (min): 35 min  Charges: OT General Charges $OT Visit: 1 Visit OT Treatments $Self Care/Home Management : 23-37 mins  Marguetta Windish MSOT, OTR/L Acute Rehab Pager: [phone] Office: [phone]   Theodoro Grist Reily Treloar 03/21/2020, 1:14  PM

## 2020-03-21 NOTE — Progress Notes (Signed)
PROGRESS NOTE    Katherine Cabrera  RJP:[id number] DOB: [birth year] DOA: 03/14/2020 PCP: Patient, No Pcp Per   Brief Narrative: 45 year old with past medical history significant for hypertension, hyperlipidemia, diabetes type 2, morbid obesity recent history of hysterectomy/bilateral 7 Pingel oophorectomy/umbilical hernia repair November 2021 at [facility] who presents with worsening abdominal pain up on imaging study found to have pelvic abscess.  The patient was taken to the OR on 1/24 for diagnostic laparoscopy and abdominal washout and lysis of inflammatory adhesions, drain placement, rigid sigmoidoscopy.  Intraoperative consult by Dr. Macon Large who performed a pelvic exam under anesthesia and instillation of vaginal vault with methylene blue.  No leak  was identified.  Patient was  receiving IV Zyvox and Zosyn.  ID  has been consulted, The patient has been changed over to Doxy and IV Unasyn.   Assessment & Plan:   Principal Problem:   Sepsis (HCC) Active Problems:   Pelvic abscess in female   Transient hypotension   Enteritis   COVID-19 virus infection   Normocytic anemia   Type 2 diabetes mellitus with hyperlipidemia (HCC)   Intraabdominal fluid collection   S/P hysterectomy  1-Sepsis Secondary to Pelvic Abscess and Enteritis -Patient presented with fever 103, tachycardia, tachypnea white blood cell 2.2 leukopenia. -CT peripheral enhancing fluid collection in the pelvis just above developing abscess and enteritis. -Underwent to the OR on 1/24 for diagnostic laparoscopy and abdominal washout and lysis of inflammatory adhesions, drain placement, rigid sigmoidoscopy.  Intraoperative consult by Dr. Macon Large who performed a pelvic exam under anesthesia and instillation of vaginal vault with methylene blue.  No leek  was identified. -treated with IV doxy and Unasyn. Diet change to soft. I have change antibiotics to Augmentin and Doxy.  -follow ID antibiotics for length of  treatment. Duration will depend on JP drain amount.  -Continue with drain in place.   2-AKI; suspect related to ACE, Hypotension, sepsis.  Improving with IV fluids.  Avoid nephrotoxins.  Awaiting labs results for today.    Normocytic anemia:  Follow trend.  Hb stable.   Hypertension; hold BP medications. SBP 100 range.  Holding lisinopril due to AKI.   SVT; received IV bolus Electrolytes normal.  SBP in 100, for this reason wont start BB.  No further episodes.   Hyperlipidemia: holding meds while NPO.   Diabetes type 2 SSI  Metabolic acidosis;  Started sodium Bicarb./   Calculated calcium for albumin; 8.0  Recent history of Covid infection: Diagnosed 12/31 Not require isolation currently.  Morbid obesity, estimated BMI 37.5   Estimated body mass index is 37.55 kg/m as calculated from the following:   Height as of this encounter: 5\' 3"  (1.6 m).   Weight as of this encounter: 96.2 kg.   DVT prophylaxis: Lovenox Code Status: Full code Family Communication: Care discussed with patient.  Disposition Plan:  Status is: Inpatient  Remains inpatient appropriate because:IV treatments appropriate due to intensity of illness or inability to take PO   Dispo: The patient is from: Home              Anticipated d/c is to: Home              Anticipated d/c date is: 1 day              Patient currently is not medically stable to d/c.awaiting improvement of renal function.    Difficult to place patient No        Consultants:   OB  gynecology  General surgery  Interventional radiology  Procedures:   Placement abscess drain in pelvis by IR on 03/15/2020  Diagnostic laparoscopy, abdominal washout and lysis of inflammatory addition, drain placement, rigid sigmoidoscopy   Antimicrobials:    Subjective: She has been tolerating clears. Feeling better.   Objective: Vitals:   03/20/20 1600 03/20/20 2242 03/21/20 0553 03/21/20 0736  BP: 138/89 124/87 119/80  122/80  Pulse: 87 90 79 79  Resp:  20 16 15   Temp: 97.6 F (36.4 C) 98.6 F (37 C) 98.5 F (36.9 C) (!) 97.1 F (36.2 C)  TempSrc: Oral Oral Oral Oral  SpO2: 100% 100% 100% 100%  Weight:      Height:        Intake/Output Summary (Last 24 hours) at 03/21/2020 1246 Last data filed at 03/21/2020 0805 Gross per 24 hour  Intake -  Output 218 ml  Net -218 ml   Filed Weights   03/14/20 1841 03/17/20 0926  Weight: 96.2 kg 96.2 kg    Examination:  General exam: NAD Respiratory system: CTA Cardiovascular system: S 1, S 2, RRR Gastrointestinal system: BS, present, soft, nt P drain in lower quadrant.  Central nervous system: Alert, follows command Extremities: Symmetric power    Data Reviewed: I have personally reviewed following labs and imaging studies  CBC: Recent Labs  Lab 03/14/20 1920 03/15/20 1443 03/16/20 0217 03/17/20 0930 03/18/20 0425 03/19/20 0345 03/20/20 0456  WBC 2.2*  --  9.2  --  12.0* 10.8* 9.2  NEUTROABS 2.0  --   --   --   --   --   --   HGB 11.1*   < > 10.3* 12.2 11.2* 12.1 11.0*  HCT 33.9*   < > 32.0* 36.0 32.8* 37.6 32.7*  MCV 81.9  --  81.6  --  79.4* 80.7 79.8*  PLT 242  --  187  --  132* 158 157   < > = values in this interval not displayed.   Basic Metabolic Panel: Recent Labs  Lab 03/14/20 1920 03/16/20 0217 03/17/20 0930 03/18/20 0425 03/19/20 0345 03/20/20 0456  NA 137 135 141 138 143 146*  K 3.6 4.1 4.0 4.0 4.0 3.9  CL 102 104 108 109 114* 116*  CO2 24 21*  --  16* 17* 17*  GLUCOSE 126* 127* 113* 178* 156* 117*  BUN 13 20 30* 39* 47* 41*  CREATININE 0.67 2.00* 2.80* 3.05* 2.98* 2.64*  CALCIUM 8.2* 7.4*  --  6.6* 6.6* 6.3*  MG  --   --   --   --  1.9 1.8   GFR: Estimated Creatinine Clearance: 30 mL/min (A) (by C-G formula based on SCr of 2.64 mg/dL (H)). Liver Function Tests: Recent Labs  Lab 03/14/20 1920 03/18/20 0425  AST 18 35  ALT 16 22  ALKPHOS 61 67  BILITOT 0.2* 1.1  PROT 6.5 5.3*  ALBUMIN 3.1* 1.8*    Recent Labs  Lab 03/14/20 1920  LIPASE 22   No results for input(s): AMMONIA in the last 168 hours. Coagulation Profile: Recent Labs  Lab 03/16/20 0217  INR 1.7*   Cardiac Enzymes: No results for input(s): CKTOTAL, CKMB, CKMBINDEX, TROPONINI in the last 168 hours. BNP (last 3 results) No results for input(s): PROBNP in the last 8760 hours. HbA1C: No results for input(s): HGBA1C in the last 72 hours. CBG: Recent Labs  Lab 03/20/20 1146 03/20/20 1622 03/20/20 2034 03/21/20 0724 03/21/20 1206  GLUCAP 145* 210* 170* 128* 181*  Lipid Profile: No results for input(s): CHOL, HDL, LDLCALC, TRIG, CHOLHDL, LDLDIRECT in the last 72 hours. Thyroid Function Tests: No results for input(s): TSH, T4TOTAL, FREET4, T3FREE, THYROIDAB in the last 72 hours. Anemia Panel: No results for input(s): VITAMINB12, FOLATE, FERRITIN, TIBC, IRON, RETICCTPCT in the last 72 hours. Sepsis Labs: Recent Labs  Lab 03/14/20 2207 03/15/20 1157  LATICACIDVEN 1.6 1.8    Recent Results (from the past 240 hour(s))  SARS Coronavirus 2 by RT PCR (hospital order, performed in [facility] hospital lab) Nasopharyngeal Peripheral     Status: Abnormal   Collection Time: 03/14/20  7:20 PM   Specimen: Peripheral; Nasopharyngeal  Result Value Ref Range Status   SARS Coronavirus 2 POSITIVE (A) NEGATIVE Final    Comment: RESULT CALLED TO, READ BACK BY AND VERIFIED WITH: NEAL KELLIE RN AT 2044 ON 03/14/20 BY I.[email or web address] (NOTE) SARS-CoV-2 target nucleic acids are DETECTED  SARS-CoV-2 RNA is generally detectable in upper respiratory specimens  during the acute phase of infection.  Positive results are indicative  of the presence of the identified virus, but do not rule out bacterial infection or co-infection with other pathogens not detected by the test.  Clinical correlation with patient history and  other diagnostic information is necessary to determine patient infection status.  The expected result is  negative.  Fact Sheet for Patients:   BoilerBrush.com.cy   Fact Sheet for Healthcare Providers:   https://pope.com/    This test is not yet approved or cleared by the [place] FDA and  has been authorized for detection and/or diagnosis of SARS-CoV-2 by FDA under an Emergency Use Authorization (EUA).  This EUA will remain in effect (meani ng this test can be used) for the duration of  the COVID-19 declaration under Section 564(b)(1) of the Act, 21 U.S.C. section 360-bbb-3(b)(1), unless the authorization is terminated or revoked sooner.  Performed at [facility], [address] [place], [place] [address]   Blood Culture (routine x 2)     Status: Abnormal   Collection Time: 03/14/20  7:30 PM   Specimen: BLOOD  Result Value Ref Range Status   Specimen Description   Final    BLOOD RIGHT ANTECUBITAL Performed at [facility], [address] [place], [place] [address]    Special Requests   Final    BOTTLES DRAWN AEROBIC AND ANAEROBIC Blood Culture adequate volume Performed at [facility], [address] [place], [place] [address]    Culture  Setup Time   Final    GRAM POSITIVE COCCI IN CHAINS ANAEROBIC BOTTLE ONLY CRITICAL RESULT CALLED TO, READ BACK BY AND VERIFIED WITH: A. Artis Flock PharmD 9:30 03/18/20 ([email or web address])    Culture (A)  Final    PEPTOSTREPTOCOCCUS MICROS Standardized susceptibility testing for this organism is not available. Performed at [facility], [address] [place], [place] [address]    Report Status 03/20/2020 FINAL  Final  Blood Culture ID Panel (Reflexed)     Status: None   Collection Time: 03/14/20  7:30 PM  Result Value Ref Range Status   Enterococcus faecalis NOT DETECTED NOT DETECTED Final   Enterococcus Faecium NOT DETECTED NOT DETECTED Final   Listeria monocytogenes NOT DETECTED NOT DETECTED Final   Staphylococcus species NOT DETECTED NOT DETECTED Final    Staphylococcus aureus (BCID) NOT DETECTED NOT DETECTED Final   Staphylococcus epidermidis NOT DETECTED NOT DETECTED Final   Staphylococcus lugdunensis NOT DETECTED NOT DETECTED Final  Streptococcus species NOT DETECTED NOT DETECTED Final   Streptococcus agalactiae NOT DETECTED NOT DETECTED Final   Streptococcus pneumoniae NOT DETECTED NOT DETECTED Final   Streptococcus pyogenes NOT DETECTED NOT DETECTED Final   A.calcoaceticus-baumannii NOT DETECTED NOT DETECTED Final   Bacteroides fragilis NOT DETECTED NOT DETECTED Final   Enterobacterales NOT DETECTED NOT DETECTED Final   Enterobacter cloacae complex NOT DETECTED NOT DETECTED Final   Escherichia coli NOT DETECTED NOT DETECTED Final   Klebsiella aerogenes NOT DETECTED NOT DETECTED Final   Klebsiella oxytoca NOT DETECTED NOT DETECTED Final   Klebsiella pneumoniae NOT DETECTED NOT DETECTED Final   Proteus species NOT DETECTED NOT DETECTED Final   Salmonella species NOT DETECTED NOT DETECTED Final   Serratia marcescens NOT DETECTED NOT DETECTED Final   Haemophilus influenzae NOT DETECTED NOT DETECTED Final   Neisseria meningitidis NOT DETECTED NOT DETECTED Final   Pseudomonas aeruginosa NOT DETECTED NOT DETECTED Final   Stenotrophomonas maltophilia NOT DETECTED NOT DETECTED Final   Candida albicans NOT DETECTED NOT DETECTED Final   Candida auris NOT DETECTED NOT DETECTED Final   Candida glabrata NOT DETECTED NOT DETECTED Final   Candida krusei NOT DETECTED NOT DETECTED Final   Candida parapsilosis NOT DETECTED NOT DETECTED Final   Candida tropicalis NOT DETECTED NOT DETECTED Final   Cryptococcus neoformans/gattii NOT DETECTED NOT DETECTED Final    Comment: Performed at [facility], [address], [place], [place] [address]  Blood Culture (routine x 2)     Status: None   Collection Time: 03/15/20 11:57 AM   Specimen: BLOOD  Result Value Ref Range Status   Specimen Description BLOOD LEFT ANTECUBITAL  Final   Special Requests    Final    BOTTLES DRAWN AEROBIC ONLY Blood Culture adequate volume   Culture   Final    NO GROWTH 5 DAYS Performed at [facility], [address], [place], [place] [address]    Report Status 03/20/2020 FINAL  Final         Radiology Studies: No results found.      Scheduled Meds: . Chlorhexidine Gluconate Cloth  6 each Topical Daily  . enoxaparin (LOVENOX) injection  30 mg Subcutaneous Q24H  . influenza vac split quadrivalent PF  0.5 mL Intramuscular Tomorrow-1000  . insulin aspart  0-6 Units Subcutaneous TID WC  . sodium bicarbonate  1,300 mg Oral BID   Continuous Infusions: . sodium chloride 125 mL/hr at 03/21/20 0938  . ampicillin-sulbactam (UNASYN) IV 3 g (03/21/20 0258)  . doxycycline (VIBRAMYCIN) IV 100 mg (03/21/20 1227)  . famotidine (PEPCID) IV 20 mg (03/21/20 0930)     LOS: 6 days    Time spent: 35 minutes.     Alba Cory, MD [facility]   If 7PM-7AM, please contact night-coverage [email or web address]  03/21/2020, 12:46 PM

## 2020-03-22 LAB — BASIC METABOLIC PANEL
Anion gap: 11 (ref 5–15)
BUN: 22 mg/dL — ABNORMAL HIGH (ref 6–20)
CO2: 16 mmol/L — ABNORMAL LOW (ref 22–32)
Calcium: 6.7 mg/dL — ABNORMAL LOW (ref 8.9–10.3)
Chloride: 115 mmol/L — ABNORMAL HIGH (ref 98–111)
Creatinine, Ser: 2.26 mg/dL — ABNORMAL HIGH (ref 0.44–1.00)
GFR, Estimated: 27 mL/min — ABNORMAL LOW (ref >60)
Glucose, Bld: 139 mg/dL — ABNORMAL HIGH (ref 70–99)
Potassium: 3.4 mmol/L — ABNORMAL LOW (ref 3.5–5.1)
Sodium: 142 mmol/L (ref 135–145)

## 2020-03-22 LAB — GLUCOSE, CAPILLARY
Glucose-Capillary: 137 mg/dL — ABNORMAL HIGH (ref 70–99)
Glucose-Capillary: 107 mg/dL — ABNORMAL HIGH (ref 70–99)
Glucose-Capillary: 152 mg/dL — ABNORMAL HIGH (ref 70–99)
Glucose-Capillary: 158 mg/dL — ABNORMAL HIGH (ref 70–99)

## 2020-03-22 LAB — CBC
HCT: 32.9 % — ABNORMAL LOW (ref 36.0–46.0)
Hemoglobin: 11.1 g/dL — ABNORMAL LOW (ref 12.0–15.0)
MCH: 26.9 pg (ref 26.0–34.0)
MCHC: 33.7 g/dL (ref 30.0–36.0)
MCV: 79.7 fL — ABNORMAL LOW (ref 80.0–100.0)
Platelets: 220 10*3/uL (ref 150–400)
RBC: 4.13 MIL/uL (ref 3.87–5.11)
RDW: 15.2 % (ref 11.5–15.5)
WBC: 15.1 10*3/uL — ABNORMAL HIGH (ref 4.0–10.5)
nRBC: 0 % (ref 0.0–0.2)

## 2020-03-22 MED ORDER — FAMOTIDINE 20 MG PO TABS
20.0000 mg | ORAL_TABLET | Freq: Every day | ORAL | Status: DC
  Administered 2020-03-23: 20 mg via ORAL
  Filled 2020-03-22: qty 1

## 2020-03-22 MED ORDER — SODIUM BICARBONATE 650 MG PO TABS
1300.0000 mg | ORAL_TABLET | Freq: Three times a day (TID) | ORAL | Status: DC
  Administered 2020-03-22 – 2020-03-23 (×3): 1300 mg via ORAL
  Filled 2020-03-22 (×3): qty 2

## 2020-03-22 NOTE — Progress Notes (Signed)
PROGRESS NOTE    Katherine Cabrera  AOZ:[id number] DOB: Jan 21, 1976 DOA: 03/14/2020 PCP: Patient, No Pcp Per   Brief Narrative: 45 year old with past medical history significant for hypertension, hyperlipidemia, diabetes type 2, morbid obesity recent history of hysterectomy/bilateral 7 Pingel oophorectomy/umbilical hernia repair November 2021 at [facility] who presents with worsening abdominal pain up on imaging study found to have pelvic abscess.  The patient was taken to the OR on 1/24 for diagnostic laparoscopy and abdominal washout and lysis of inflammatory adhesions, drain placement, rigid sigmoidoscopy.  Intraoperative consult by Dr. Macon Large who performed a pelvic exam under anesthesia and instillation of vaginal vault with methylene blue.  No leak  was identified.  Patient was  receiving IV Zyvox and Zosyn.  ID  has been consulted, The patient has been changed over to Doxy and IV Unasyn.   Assessment & Plan:   Principal Problem:   Sepsis (HCC) Active Problems:   Abscess of female pelvis   Transient hypotension   Enteritis   COVID-19 virus infection   Normocytic anemia   Type 2 diabetes mellitus with hyperlipidemia (HCC)   Intraabdominal fluid collection   S/P hysterectomy   Neutropenia (HCC)  1-Sepsis Secondary to Pelvic Abscess and Enteritis -Patient presented with fever 103, tachycardia, tachypnea white blood cell 2.2 leukopenia. -CT peripheral enhancing fluid collection in the pelvis just above developing abscess and enteritis. -Underwent to the OR on 1/24 for diagnostic laparoscopy and abdominal washout and lysis of inflammatory adhesions, drain placement, rigid sigmoidoscopy.  Intraoperative consult by Dr. Macon Large who performed a pelvic exam under anesthesia and instillation of vaginal vault with methylene blue.  No leek  was identified. -treated with IV doxy and Unasyn. Diet change to soft. I have change antibiotics to Augmentin and Doxy.  -Needs 2 weeks of  oral antibiotics.  -Needs to follow up with ID 10 days post discharge.  -Continue with drain in place.  -WBC mildly increase repeat again tomorrow.  2-AKI; suspect related to ACE, Hypotension, sepsis.  Improving with IV fluids.  Avoid nephrotoxins.  Cr at 2.2 continue with IV fluids.  Repeat labs tomorrow.    Normocytic anemia:  Follow trend.  Hb stable.   Hypertension; hold BP medications. SBP 100 range.  Holding lisinopril due to AKI.   SVT; received IV bolus Electrolytes normal.  SBP in 100, for this reason wont start BB.  No further episodes.   Hyperlipidemia: holding meds while NPO.   Diabetes type 2 SSI  Metabolic acidosis;  Started sodium Bicarb./   Calculated calcium for albumin; 8.0  Recent history of Covid infection: Diagnosed 12/31 Not require isolation currently.  Morbid obesity, estimated BMI 37.5   Estimated body mass index is 37.55 kg/m as calculated from the following:   Height as of this encounter: 5\' 3"  (1.6 m).   Weight as of this encounter: 96.2 kg.   DVT prophylaxis: Lovenox Code Status: Full code Family Communication: Care discussed with patient.  Disposition Plan:  Status is: Inpatient  Remains inpatient appropriate because:IV treatments appropriate due to intensity of illness or inability to take PO   Dispo: The patient is from: Home              Anticipated d/c is to: Home              Anticipated d/c date is: 1 day              Patient currently is not medically stable to d/c.awaiting improvement of renal function.  Difficult to place patient No        Consultants:   OB gynecology  General surgery  Interventional radiology  Procedures:   Placement abscess drain in pelvis by IR on 03/15/2020  Diagnostic laparoscopy, abdominal washout and lysis of inflammatory addition, drain placement, rigid sigmoidoscopy   Antimicrobials:    Subjective: Report feeling bloated at time. Denies worsening pain.  Had BM     Objective: Vitals:   03/21/20 0736 03/21/20 2154 03/22/20 0619 03/22/20 1300  BP: 122/80 124/77 127/80 129/85  Pulse: 79 87 78 77  Resp: 15 17 17 16   Temp: (!) 97.1 F (36.2 C) (!) 97.5 F (36.4 C) 98 F (36.7 C) 98.6 F (37 C)  TempSrc: Oral     SpO2: 100% 100% 100% 100%  Weight:      Height:        Intake/Output Summary (Last 24 hours) at 03/22/2020 1441 Last data filed at 03/22/2020 1300 Gross per 24 hour  Intake 4662.44 ml  Output 430 ml  Net 4232.44 ml   Filed Weights   03/14/20 1841 03/17/20 0926  Weight: 96.2 kg 96.2 kg    Examination:  General exam: NAD Respiratory system: CTA Cardiovascular system: S 1, S 2 RRR Gastrointestinal system: BS present, soft, nt  P drain in lower quadrant.  Central nervous system: alert, follows command Extremities symmetric power    Data Reviewed: I have personally reviewed following labs and imaging studies  CBC: Recent Labs  Lab 03/18/20 0425 03/19/20 0345 03/20/20 0456 03/21/20 1615 03/22/20 1204  WBC 12.0* 10.8* 9.2 14.1* 15.1*  HGB 11.2* 12.1 11.0* 10.8* 11.1*  HCT 32.8* 37.6 32.7* 32.4* 32.9*  MCV 79.4* 80.7 79.8* 79.8* 79.7*  PLT 132* 158 157 263 220   Basic Metabolic Panel: Recent Labs  Lab 03/18/20 0425 03/19/20 0345 03/20/20 0456 03/21/20 1615 03/22/20 1204  NA 138 143 146* 141 142  K 4.0 4.0 3.9 3.6 3.4*  CL 109 114* 116* 114* 115*  CO2 16* 17* 17* 17* 16*  GLUCOSE 178* 156* 117* 161* 139*  BUN 39* 47* 41* 26* 22*  CREATININE 3.05* 2.98* 2.64* 2.27* 2.26*  CALCIUM 6.6* 6.6* 6.3* 6.5* 6.7*  MG  --  1.9 1.8  --   --   PHOS  --   --   --  2.8  --    GFR: Estimated Creatinine Clearance: 35.1 mL/min (A) (by C-G formula based on SCr of 2.26 mg/dL (H)). Liver Function Tests: Recent Labs  Lab 03/18/20 0425 03/21/20 1615  AST 35  --   ALT 22  --   ALKPHOS 67  --   BILITOT 1.1  --   PROT 5.3*  --   ALBUMIN 1.8* 1.8*   No results for input(s): LIPASE, AMYLASE in the last 168 hours. No  results for input(s): AMMONIA in the last 168 hours. Coagulation Profile: Recent Labs  Lab 03/16/20 0217  INR 1.7*   Cardiac Enzymes: No results for input(s): CKTOTAL, CKMB, CKMBINDEX, TROPONINI in the last 168 hours. BNP (last 3 results) No results for input(s): PROBNP in the last 8760 hours. HbA1C: No results for input(s): HGBA1C in the last 72 hours. CBG: Recent Labs  Lab 03/21/20 0724 03/21/20 1206 03/21/20 1614 03/22/20 0842 03/22/20 1211  GLUCAP 128* 181* 147* 137* 107*   Lipid Profile: No results for input(s): CHOL, HDL, LDLCALC, TRIG, CHOLHDL, LDLDIRECT in the last 72 hours. Thyroid Function Tests: No results for input(s): TSH, T4TOTAL, FREET4, T3FREE, THYROIDAB in the last  72 hours. Anemia Panel: No results for input(s): VITAMINB12, FOLATE, FERRITIN, TIBC, IRON, RETICCTPCT in the last 72 hours. Sepsis Labs: No results for input(s): PROCALCITON, LATICACIDVEN in the last 168 hours.  Recent Results (from the past 240 hour(s))  SARS Coronavirus 2 by RT PCR (hospital order, performed in [facility] hospital lab) Nasopharyngeal Peripheral     Status: Abnormal   Collection Time: 03/14/20  7:20 PM   Specimen: Peripheral; Nasopharyngeal  Result Value Ref Range Status   SARS Coronavirus 2 POSITIVE (A) NEGATIVE Final    Comment: RESULT CALLED TO, READ BACK BY AND VERIFIED WITH: NEAL KELLIE RN AT 2044 ON 03/14/20 BY [email or web address] (NOTE) SARS-CoV-2 target nucleic acids are DETECTED  SARS-CoV-2 RNA is generally detectable in upper respiratory specimens  during the acute phase of infection.  Positive results are indicative  of the presence of the identified virus, but do not rule out bacterial infection or co-infection with other pathogens not detected by the test.  Clinical correlation with patient history and  other diagnostic information is necessary to determine patient infection status.  The expected result is negative.  Fact Sheet for Patients:    BoilerBrush.com.cy   Fact Sheet for Healthcare Providers:   https://pope.com/    This test is not yet approved or cleared by the [place] FDA and  has been authorized for detection and/or diagnosis of SARS-CoV-2 by FDA under an Emergency Use Authorization (EUA).  This EUA will remain in effect (meani ng this test can be used) for the duration of  the COVID-19 declaration under Section 564(b)(1) of the Act, 21 U.S.C. section 360-bbb-3(b)(1), unless the authorization is terminated or revoked sooner.  Performed at [facility], [address] [place], [place] [address]   Blood Culture (routine x 2)     Status: Abnormal   Collection Time: 03/14/20  7:30 PM   Specimen: BLOOD  Result Value Ref Range Status   Specimen Description   Final    BLOOD RIGHT ANTECUBITAL Performed at [facility], [address] [place], [place] [address]    Special Requests   Final    BOTTLES DRAWN AEROBIC AND ANAEROBIC Blood Culture adequate volume Performed at [facility], [address] [place], [place] [address]    Culture  Setup Time   Final    GRAM POSITIVE COCCI IN CHAINS ANAEROBIC BOTTLE ONLY CRITICAL RESULT CALLED TO, READ BACK BY AND VERIFIED WITH: A. Artis Flock PharmD 9:30 03/18/20 ([email or web address])    Culture (A)  Final    PEPTOSTREPTOCOCCUS MICROS Standardized susceptibility testing for this organism is not available. Performed at [facility], [address] [place], [place] [address]    Report Status 03/20/2020 FINAL  Final  Blood Culture ID Panel (Reflexed)     Status: None   Collection Time: 03/14/20  7:30 PM  Result Value Ref Range Status   Enterococcus faecalis NOT DETECTED NOT DETECTED Final   Enterococcus Faecium NOT DETECTED NOT DETECTED Final   Listeria monocytogenes NOT DETECTED NOT DETECTED Final   Staphylococcus species NOT DETECTED NOT DETECTED Final   Staphylococcus aureus (BCID) NOT  DETECTED NOT DETECTED Final   Staphylococcus epidermidis NOT DETECTED NOT DETECTED Final   Staphylococcus lugdunensis NOT DETECTED NOT DETECTED Final   Streptococcus species NOT DETECTED NOT DETECTED Final   Streptococcus agalactiae NOT DETECTED NOT DETECTED Final   Streptococcus pneumoniae NOT DETECTED NOT DETECTED Final   Streptococcus pyogenes NOT DETECTED NOT DETECTED Final  A.calcoaceticus-baumannii NOT DETECTED NOT DETECTED Final   Bacteroides fragilis NOT DETECTED NOT DETECTED Final   Enterobacterales NOT DETECTED NOT DETECTED Final   Enterobacter cloacae complex NOT DETECTED NOT DETECTED Final   Escherichia coli NOT DETECTED NOT DETECTED Final   Klebsiella aerogenes NOT DETECTED NOT DETECTED Final   Klebsiella oxytoca NOT DETECTED NOT DETECTED Final   Klebsiella pneumoniae NOT DETECTED NOT DETECTED Final   Proteus species NOT DETECTED NOT DETECTED Final   Salmonella species NOT DETECTED NOT DETECTED Final   Serratia marcescens NOT DETECTED NOT DETECTED Final   Haemophilus influenzae NOT DETECTED NOT DETECTED Final   Neisseria meningitidis NOT DETECTED NOT DETECTED Final   Pseudomonas aeruginosa NOT DETECTED NOT DETECTED Final   Stenotrophomonas maltophilia NOT DETECTED NOT DETECTED Final   Candida albicans NOT DETECTED NOT DETECTED Final   Candida auris NOT DETECTED NOT DETECTED Final   Candida glabrata NOT DETECTED NOT DETECTED Final   Candida krusei NOT DETECTED NOT DETECTED Final   Candida parapsilosis NOT DETECTED NOT DETECTED Final   Candida tropicalis NOT DETECTED NOT DETECTED Final   Cryptococcus neoformans/gattii NOT DETECTED NOT DETECTED Final    Comment: Performed at [facility], [address] [place], [place] [address]  Blood Culture (routine x 2)     Status: None   Collection Time: 03/15/20 11:57 AM   Specimen: BLOOD  Result Value Ref Range Status   Specimen Description BLOOD LEFT ANTECUBITAL  Final   Special Requests   Final    BOTTLES DRAWN AEROBIC  ONLY Blood Culture adequate volume   Culture   Final    NO GROWTH 5 DAYS Performed at [facility], [address] [place], [place] [address]    Report Status 03/20/2020 FINAL  Final         Radiology Studies: No results found.      Scheduled Meds: . amoxicillin-clavulanate  1 tablet Oral Q12H  . Chlorhexidine Gluconate Cloth  6 each Topical Daily  . doxycycline  100 mg Oral Q12H  . enoxaparin (LOVENOX) injection  30 mg Subcutaneous Q24H  . [START ON 03/23/2020] famotidine  20 mg Oral Daily  . insulin aspart  0-6 Units Subcutaneous TID WC  . sodium bicarbonate  1,300 mg Oral TID   Continuous Infusions:    LOS: 7 days    Time spent: 35 minutes.     Alba Cory, MD [facility]   If 7PM-7AM, please contact night-coverage [email or web address]  03/22/2020, 2:41 PM

## 2020-03-22 NOTE — Progress Notes (Signed)
[facility] Progress Note   Subjective:   Tolerating diet.  No new issues.  UOP yesterday recorded.  Foley out - no difficulty voiding.  Seems to be getting stronger  Objective Vitals:   03/21/20 0736 03/21/20 2154 03/22/20 0619 03/22/20 1300  BP: 122/80 124/77 127/80 129/85  Pulse: 79 87 78 77  Resp: 15 17 17 16   Temp: (!) 97.1 F (36.2 C) (!) 97.5 F (36.4 C) 98 F (36.7 C) 98.6 F (37 C)  TempSrc: Oral     SpO2: 100% 100% 100% 100%  Weight:      Height:       Physical Exam General: sitting up in bed Heart:RRR Lungs: clear Abdomen: soft, mod TTP; drain in place Extremities: no edema GU: foley out  Additional Objective Labs: Basic Metabolic Panel: Recent Labs  Lab 03/20/20 0456 03/21/20 1615 03/22/20 1204  NA 146* 141 142  K 3.9 3.6 3.4*  CL 116* 114* 115*  CO2 17* 17* 16*  GLUCOSE 117* 161* 139*  BUN 41* 26* 22*  CREATININE 2.64* 2.27* 2.26*  CALCIUM 6.3* 6.5* 6.7*  PHOS  --  2.8  --    Liver Function Tests: Recent Labs  Lab 03/18/20 0425 03/21/20 1615  AST 35  --   ALT 22  --   ALKPHOS 67  --   BILITOT 1.1  --   PROT 5.3*  --   ALBUMIN 1.8* 1.8*   No results for input(s): LIPASE, AMYLASE in the last 168 hours. CBC: Recent Labs  Lab 03/18/20 0425 03/19/20 0345 03/20/20 0456 03/21/20 1615 03/22/20 1204  WBC 12.0* 10.8* 9.2 14.1* 15.1*  HGB 11.2* 12.1 11.0* 10.8* 11.1*  HCT 32.8* 37.6 32.7* 32.4* 32.9*  MCV 79.4* 80.7 79.8* 79.8* 79.7*  PLT 132* 158 157 263 220   Blood Culture    Component Value Date/Time   SDES BLOOD LEFT ANTECUBITAL 03/15/2020 1157   SPECREQUEST  03/15/2020 1157    BOTTLES DRAWN AEROBIC ONLY Blood Culture adequate volume   CULT  03/15/2020 1157    NO GROWTH 5 DAYS Performed at [facility], [address], [place], [place]    REPTSTATUS 03/20/2020 FINAL 03/15/2020 1157    Cardiac Enzymes: No results for input(s): CKTOTAL, CKMB, CKMBINDEX, TROPONINI in the last 168  hours. CBG: Recent Labs  Lab 03/21/20 0724 03/21/20 1206 03/21/20 1614 03/22/20 0842 03/22/20 1211  GLUCAP 128* 181* 147* 137* 107*   Iron Studies: No results for input(s): IRON, TIBC, TRANSFERRIN, FERRITIN in the last 72 hours. @lablastinr3 @ Studies/Results: No results found. Medications:  . amoxicillin-clavulanate  1 tablet Oral Q12H  . Chlorhexidine Gluconate Cloth  6 each Topical Daily  . doxycycline  100 mg Oral Q12H  . enoxaparin (LOVENOX) injection  30 mg Subcutaneous Q24H  . [START ON 03/23/2020] famotidine  20 mg Oral Daily  . insulin aspart  0-6 Units Subcutaneous TID WC  . sodium bicarbonate  1,300 mg Oral TID    Assessment/Plan: **sepsis secondary to pelvic abscesses:  On antibiotic coverage and s/p surgical wash out.  Management per primary.  I don't think she has AIN so no need to change abx at this time.   **AKI: Suspect multifactorial with hypovolemia, hypotension with concurrent ACEi, NSAID, contrast.  Cr peak 3.  CT without evidence of obstruction.  Foley out and no voiding issues. UA with small blood, suspect from foley, no protein.  Cont to hold ACEi, maintain euvolemia. Stop IVF today and if she can  maintain hydration with oral intake and renal function stable to improved tomorrow ok to d/c. Avoid nephrotoxins and hypotension as able.    **Metabolic acidosis: secondary to AKI; mild.  ^ na bicarb po.   **HTN:  BP currently acceptable without meds.    **DM: well controlled, no metformin for now.   **HypoCa:  Corrects to 8.1.   Will follow. Per above if can maintain with po intake and cr stable/improved tomorrow ok to d/c.   Estill Bakes MD 03/22/2020, 1:56 PM  [facility] Pager: [phone]

## 2020-03-22 NOTE — Progress Notes (Signed)
Progress Note  5 Days Post-Op  Subjective: Patient denies abdominal pain. Tolerating soft diet without nausea or vomiting. Having bowel function. Understands she will be discharged with drain.   Objective: Vital signs in last 24 hours: Temp:  [97.5 F (36.4 C)-98 F (36.7 C)] 98 F (36.7 C) (01/29 0619) Pulse Rate:  [78-87] 78 (01/29 0619) Resp:  [17] 17 (01/29 0619) BP: (124-127)/(77-80) 127/80 (01/29 0619) SpO2:  [100 %] 100 % (01/29 0619) Last BM Date: 03/17/20  Intake/Output from previous day: 01/28 0701 - 01/29 0700 In: -  Out: 438 [Urine:300; Drains:138] Intake/Output this shift: Total I/O In: -  Out: 40 [Drains:40]  PE: General: pleasant, WD, obese female who is laying in bed in NAD Heart: regular, rate, and rhythm.   Lungs: CTAB, no wheezes, rhonchi, or rales noted.  Respiratory effort nonlabored Abd: soft, non-tender, mildly distended, +BS, drain with SS fluid, incisions c/d/i MS: all 4 extremities are symmetrical with no cyanosis, clubbing, or edema. Skin: warm and dry with no masses, lesions, or rashes Neuro: Cranial nerves 2-12 grossly intact, sensation is normal throughout Psych: A&Ox3 with an appropriate affect.   Lab Results:  Recent Labs    03/20/20 0456 03/21/20 1615  WBC 9.2 14.1*  HGB 11.0* 10.8*  HCT 32.7* 32.4*  PLT 157 263   BMET Recent Labs    03/20/20 0456 03/21/20 1615  NA 146* 141  K 3.9 3.6  CL 116* 114*  CO2 17* 17*  GLUCOSE 117* 161*  BUN 41* 26*  CREATININE 2.64* 2.27*  CALCIUM 6.3* 6.5*   PT/INR No results for input(s): LABPROT, INR in the last 72 hours. CMP     Component Value Date/Time   NA 141 03/21/2020 1615   K 3.6 03/21/2020 1615   CL 114 (H) 03/21/2020 1615   CO2 17 (L) 03/21/2020 1615   GLUCOSE 161 (H) 03/21/2020 1615   BUN 26 (H) 03/21/2020 1615   CREATININE 2.27 (H) 03/21/2020 1615   CALCIUM 6.5 (L) 03/21/2020 1615   PROT 5.3 (L) 03/18/2020 0425   ALBUMIN 1.8 (L) 03/21/2020 1615   AST 35  03/18/2020 0425   ALT 22 03/18/2020 0425   ALKPHOS 67 03/18/2020 0425   BILITOT 1.1 03/18/2020 0425   GFRNONAA 27 (L) 03/21/2020 1615   GFRAA >60 09/26/2014 0850   Lipase     Component Value Date/Time   LIPASE 22 03/14/2020 1920       Studies/Results: No results found.  Anti-infectives: Anti-infectives (From admission, onward)   Start     Dose/Rate Route Frequency Ordered Stop   03/21/20 1345  amoxicillin-clavulanate (AUGMENTIN) 875-125 MG per tablet 1 tablet        1 tablet Oral Every 12 hours 03/21/20 1248     03/21/20 1345  doxycycline (VIBRA-TABS) tablet 100 mg        100 mg Oral Every 12 hours 03/21/20 1248     03/18/20 1400  doxycycline (VIBRAMYCIN) 100 mg in sodium chloride 0.9 % 250 mL IVPB  Status:  Discontinued        100 mg 125 mL/hr over 120 Minutes Intravenous Every 12 hours 03/18/20 1159 03/21/20 1248   03/18/20 1400  Ampicillin-Sulbactam (UNASYN) 3 g in sodium chloride 0.9 % 100 mL IVPB  Status:  Discontinued        3 g 200 mL/hr over 30 Minutes Intravenous Every 12 hours 03/18/20 1201 03/21/20 1248   03/17/20 2000  linezolid (ZYVOX) IVPB 600 mg  Status:  Discontinued  600 mg 300 mL/hr over 60 Minutes Intravenous Every 12 hours 03/17/20 1437 03/18/20 1159   03/17/20 1445  linezolid (ZYVOX) IVPB 600 mg  Status:  Discontinued        600 mg 300 mL/hr over 60 Minutes Intravenous Every 12 hours 03/17/20 1350 03/17/20 1437   03/17/20 1445  piperacillin-tazobactam (ZOSYN) IVPB 3.375 g  Status:  Discontinued        3.375 g 12.5 mL/hr over 240 Minutes Intravenous Every 8 hours 03/17/20 1437 03/18/20 1159   03/16/20 2300  vancomycin (VANCOCIN) IVPB 1000 mg/200 mL premix  Status:  Discontinued        1,000 mg 200 mL/hr over 60 Minutes Intravenous Every 24 hours 03/16/20 1347 03/17/20 1325   03/15/20 2300  vancomycin (VANCOCIN) IVPB 1000 mg/200 mL premix  Status:  Discontinued        1,000 mg 200 mL/hr over 60 Minutes Intravenous Every 12 hours 03/15/20 1028  03/16/20 1347   03/15/20 0830  vancomycin (VANCOCIN) IVPB 1000 mg/200 mL premix       "Followed by" Linked Group Details   1,000 mg 200 mL/hr over 60 Minutes Intravenous  Once 03/15/20 0722 03/15/20 0940   03/15/20 0800  piperacillin-tazobactam (ZOSYN) IVPB 3.375 g  Status:  Discontinued        3.375 g 12.5 mL/hr over 240 Minutes Intravenous Every 8 hours 03/15/20 0722 03/17/20 1437   03/15/20 0730  vancomycin (VANCOCIN) IVPB 1000 mg/200 mL premix       "Followed by" Linked Group Details   1,000 mg 200 mL/hr over 60 Minutes Intravenous  Once 03/15/20 0722 03/15/20 0835   03/14/20 2045  piperacillin-tazobactam (ZOSYN) IVPB 3.375 g        3.375 g 100 mL/hr over 30 Minutes Intravenous  Once 03/14/20 2032 03/14/20 2113       Assessment/Plan DM HLD 3 months s/p uncomplicated robotic hysterectomy and bilateral salpingectomy and umbilical hernia repair with biologic mesh 12/25/2019 Covid+ 03/01/20 AKI - Cr2.27 yesterday from baseline 0.67- BUN 26, both trending down, concern for intrinsic renal on top of prerenal, appreciate nephrology care  Sepsis- blood cx GPC in chains-abx changed from linezolid/zosyn to unasyn/doxy on 1/25. Now on PO Augmentin/Doxy. Appreciate ID care.  Enteritis with free fluid and pelvic abscesses, etiology unclear - Fluid dissipated anteriorly and remaining pelvic collection was not accessible and therefore no interventional radiology drain could be placed 1/22 - Given history of bleeding and pain with intercourse after hysterectomy I am concerned that she could have a small vaginal cuff leak which could certainly explain the unusual timeline of events following her recent surgery - s/p dx lap, abdominal washout and drain placement. Could not elicit vaginal cuff leak but abscess in pelvis suggestive of the same. No evidence of bowel injury. - tolerating soft diet - mobilize as tolerated - drain with SS fluid but 140 cc out in last 24h, will keep in place for  now Stable for discharge from a surgical standpoint if WBC stable or improved from yesterday. Abx per ID. Surgical follow up in AVS.  ID -as above. WBC 14 yesterday, afebrile, tachycardia resolved.  FEN - IVF, soft diet VTE - SCDs, lovenox Foley -removed  LOS: 7 days    Juliet Rude , [facility] 03/22/2020, 9:18 AM Please see Amion for pager number during day hours 7:00am-4:30pm

## 2020-03-23 LAB — CBC
HCT: 39.2 % (ref 36.0–46.0)
Hemoglobin: 12.5 g/dL (ref 12.0–15.0)
MCH: 26.5 pg (ref 26.0–34.0)
MCHC: 31.9 g/dL (ref 30.0–36.0)
MCV: 83.2 fL (ref 80.0–100.0)
Platelets: 231 10*3/uL (ref 150–400)
RBC: 4.71 MIL/uL (ref 3.87–5.11)
RDW: 15.3 % (ref 11.5–15.5)
WBC: 12.2 10*3/uL — ABNORMAL HIGH (ref 4.0–10.5)
nRBC: 0 % (ref 0.0–0.2)

## 2020-03-23 LAB — RENAL FUNCTION PANEL
Albumin: 1.8 g/dL — ABNORMAL LOW (ref 3.5–5.0)
Anion gap: 13 (ref 5–15)
BUN: 20 mg/dL (ref 6–20)
CO2: 14 mmol/L — ABNORMAL LOW (ref 22–32)
Calcium: 7.1 mg/dL — ABNORMAL LOW (ref 8.9–10.3)
Chloride: 113 mmol/L — ABNORMAL HIGH (ref 98–111)
Creatinine, Ser: 2.22 mg/dL — ABNORMAL HIGH (ref 0.44–1.00)
GFR, Estimated: 27 mL/min — ABNORMAL LOW (ref >60)
Glucose, Bld: 156 mg/dL — ABNORMAL HIGH (ref 70–99)
Phosphorus: 3.4 mg/dL (ref 2.5–4.6)
Potassium: 3.8 mmol/L (ref 3.5–5.1)
Sodium: 140 mmol/L (ref 135–145)

## 2020-03-23 LAB — GLUCOSE, CAPILLARY
Glucose-Capillary: 159 mg/dL — ABNORMAL HIGH (ref 70–99)
Glucose-Capillary: 183 mg/dL — ABNORMAL HIGH (ref 70–99)

## 2020-03-23 MED ORDER — AMOXICILLIN-POT CLAVULANATE 875-125 MG PO TABS: 1.0000 | ORAL_TABLET | Freq: Two times a day (BID) | ORAL | 0 refills | Status: AC

## 2020-03-23 MED ORDER — SODIUM BICARBONATE 650 MG PO TABS: 1300.0000 mg | ORAL_TABLET | Freq: Three times a day (TID) | ORAL | 0 refills | Status: AC

## 2020-03-23 MED ORDER — DOXYCYCLINE HYCLATE 100 MG PO TABS: 100.0000 mg | ORAL_TABLET | Freq: Two times a day (BID) | ORAL | 0 refills | Status: AC

## 2020-03-23 MED ORDER — FAMOTIDINE 20 MG PO TABS: 20.0000 mg | ORAL_TABLET | Freq: Every day | ORAL | 0 refills | Status: AC

## 2020-03-23 MED ORDER — ACETAMINOPHEN 325 MG PO TABS: 650.0000 mg | ORAL_TABLET | Freq: Four times a day (QID) | ORAL | 0 refills | Status: DC | PRN

## 2020-03-23 MED ORDER — TRAMADOL HCL 50 MG PO TABS: 50.0000 mg | ORAL_TABLET | Freq: Four times a day (QID) | ORAL | 0 refills | Status: AC | PRN

## 2020-03-23 NOTE — Plan of Care (Signed)

## 2020-03-23 NOTE — Discharge Summary (Signed)
Physician Discharge Summary  Katherine Cabrera YTK:[id number] DOB: 08-18-1975 DOA: 03/14/2020  PCP: Patient, No Pcp Per  Admit date: 03/14/2020 Discharge date: 03/23/2020  Admitted From: Home  Disposition:  Home   Recommendations for Outpatient Follow-up:  1. Follow up with PCP in 1-2 weeks 2. Please obtain BMP/CBC in one week 3. Needs to follow up with Nephrology for AKI, repeat B-met.  4. Needs to follow up with Surgery and ID for further care pelvis abscess.   Home Health:   Discharge Condition: Stable.  CODE STATUS: Full Code Diet recommendation: Heart Healthy  Brief/Interim Summary: 45 year old with past medical history significant for hypertension, hyperlipidemia, diabetes type 2, morbid obesity recent history of hysterectomy/bilateral 7 Pingel oophorectomy/umbilical hernia repair November 2021 at [facility] who presents with worsening abdominal pain up on imaging study found to have pelvic abscess.  The patient was taken to the OR on 1/24 for diagnostic laparoscopy and abdominal washout and lysis of inflammatory adhesions, drain placement, rigid sigmoidoscopy.  Intraoperative consult by Dr. Harolyn Rutherford who performed a pelvic exam under anesthesia and instillation of vaginal vault with methylene blue.  No leak  was identified.  Patient was  receiving IV Zyvox and Zosyn.  ID  has been consulted, The patient has been changed over to Doxy and IV Unasyn. Plan to discharge on Augmentin and Doxy for 2 weeks. Needs close follow up with Surgery and ID>    1-Sepsis Secondary to Pelvic Abscess and Enteritis -Patient presented with fever 103, tachycardia, tachypnea white blood cell 2.2 leukopenia. -Sepsis present on admission.  -CT peripheral enhancing fluid collection in the pelvis just above developing abscess and enteritis. -Underwent to the OR on 1/24 for diagnostic laparoscopy and abdominal washout and lysis of inflammatory adhesions, drain placement, rigid sigmoidoscopy.   Intraoperative consult by Dr. Harolyn Rutherford who performed a pelvic exam under anesthesia and instillation of vaginal vault with methylene blue.  No leek  was identified. -Treated with IV doxy and Unasyn. Antibiotics subsequently  change  to Augmentin and Doxy.  -Needs 2 weeks of oral antibiotics.  -Needs to follow up with ID 10 days post discharge.  -Continue with drain in place.  -WBC decreased down to 12 from 15 yesterday.  -Stable for discharge. See surgery recommendation on AVS  2-AKI; suspect related to ACE, Hypotension, sepsis.  Improving with IV fluids.  Avoid nephrotoxins.  Improved and cr stable.  Plan to discharge home today. She will need close follow up with nephrologist.   Normocytic anemia:  Follow trend.  Hb stable.   Hypertension; hold BP medications. SBP 100 range.  Holding lisinopril due to AKI.   SVT; received IV bolus Electrolytes normal.  SBP in 100, for this reason wont start BB.  No further episodes.   Hyperlipidemia: resume at discharge.   Diabetes type 2 SSI Hold Metformin at discharge due to renal failure.  She has not required Significant amount of insulin.  CBG 100--150.  Close follow up with PCP in case she required new medication for DM   Metabolic acidosis;  Started sodium Bicarb./  Discharge on 1300 mg TID  Calculated calcium for albumin; 8.0  Recent history of Covid infection: Diagnosed 12/31 Not require isolation currently.  Morbid obesity, estimated BMI 37.5  Discharge Diagnoses:  Principal Problem:   Sepsis ([place]) Active Problems:   Abscess of female pelvis   Transient hypotension   Enteritis   COVID-19 virus infection   Normocytic anemia   Type 2 diabetes mellitus with hyperlipidemia ([place])  Intraabdominal fluid collection   S/P hysterectomy   Neutropenia [facility])    Discharge Instructions  Discharge Instructions    Diet - low sodium heart healthy   Complete by: As directed    Discharge wound care:   Complete by: As  directed    See surgery recommendations   Increase activity slowly   Complete by: As directed      Allergies as of 03/23/2020   No Known Allergies     Medication List    STOP taking these medications   dicyclomine 20 MG tablet Commonly known as: BENTYL   lisinopril 2.5 MG tablet Commonly known as: ZESTRIL   metFORMIN 500 MG 24 hr tablet Commonly known as: GLUCOPHAGE-XR   naproxen 500 MG tablet Commonly known as: NAPROSYN     TAKE these medications   acetaminophen 325 MG tablet Commonly known as: TYLENOL Take 2 tablets (650 mg total) by mouth every 6 (six) hours as needed for mild pain (or Fever >/= 101).   amoxicillin-clavulanate 875-125 MG tablet Commonly known as: AUGMENTIN Take 1 tablet by mouth every 12 (twelve) hours for 14 days.   atorvastatin 40 MG tablet Commonly known as: LIPITOR Take 40 mg by mouth at bedtime.   doxycycline 100 MG tablet Commonly known as: VIBRA-TABS Take 1 tablet (100 mg total) by mouth every 12 (twelve) hours for 14 days.   famotidine 20 MG tablet Commonly known as: PEPCID Take 1 tablet (20 mg total) by mouth daily.   FreeStyle Libre 14 Day Sensor Misc Apply 1 each topically every 14 (fourteen) days.   Multi-Vitamin tablet Take 1 tablet by mouth daily.   sodium bicarbonate 650 MG tablet Take 2 tablets (1,300 mg total) by mouth 3 (three) times daily.   traMADol 50 MG tablet Commonly known as: ULTRAM Take 1 tablet (50 mg total) by mouth every 6 (six) hours as needed for up to 5 days for moderate pain.            Discharge Care Instructions  (From admission, onward)         Start     Ordered   03/23/20 0000  Discharge wound care:       Comments: See surgery recommendations   03/23/20 0900          Follow-up Information    Clovis Riley, MD. Go on 04/04/2020.   Specialty: General Surgery Why: Your appointment is 04/04/20 at 2:30pm Please arrive 15 minutes early to check in. Contact information: [address] [place] [address] [facility], [place]. Go on 03/28/2020.   Specialty: General Surgery Why: Your appointment is 03/28/20 at 10am for drain check Please arrive 30 minutes prior to your appointment to check in and fill out paperwork. Bring photo ID and insurance information.  Contact information: [address] [place] [phone]       Carlyle Basques, MD Follow up in 10 day(s).   Specialty: Infectious Diseases Why: please call office to arrange follow up  Contact information: [place] [address] [phone]        Justin Mend, MD Follow up in 2 week(s).   Specialty: Internal Medicine Why: you need follow up with Dr Johnney Ou to follow your kidney function.  Contact information: [address] [place] [address] [phone]  No Known Allergies  Consultations:  Surgery  ID  nephrology   Procedures/Studies: CT PELVIS WO CONTRAST  Result Date: 03/16/2020 CLINICAL DATA:  Development pelvic fluid collections after prior hysterectomy with suspicion of potential abscesses. One of these collections that is located in the posterior pelvis anterior to the rectum is potentially accessible to percutaneous aspiration/drainage. A deeper left-sided collection is not accessible to drainage given bowel loops and bladder anteriorly and major vessels and bony structures posteriorly. The patient presents for imaging prior to planned possible aspiration or catheter drainage of the posterior pelvic fluid collection. EXAM: CT PELVIS WITHOUT CONTRAST TECHNIQUE: Multidetector CT imaging of the pelvis was performed following the standard protocol without intravenous contrast. COMPARISON:  CT of the abdomen and pelvis on 03/14/2020 FINDINGS: Urinary Tract: The bladder is distended with excreted contrast from prior CT. Bowel: Pelvic bowel loops  demonstrate some persistent dilatation of small bowel likely consistent with ileus. Component of partial obstruction is not excluded. Vascular/Lymphatic: No enlarged lymph nodes or vascular abnormalities by unenhanced CT. Reproductive:  Status post hysterectomy. Other: The previously noted posterior fluid collection anterior to the rectum is largely dissipated in the prone position likely due to anterior transit of free fluid. No significant discrete focal fluid collection is identified to allow for percutaneous drainage. The deeper left-sided rounded pelvic collection does appear to remain present in the prone position and measures approximately 4 cm. This is located posterior to the bladder and there is no safe percutaneous window for access to this collection posteriorly due to major iliac vessels as well as the bony pelvis. Musculoskeletal: Unremarkable. IMPRESSION: 1. The previously noted posterior fluid collection anterior to the rectum is largely dissipated in the prone position likely due to anterior transit of free fluid. No significant discrete focal fluid collection is identified to allow for percutaneous drainage. The deeper left-sided rounded collection does appear to remain present in the prone position and measures approximately 4 cm. This is located posterior to the bladder and there is no safe percutaneous window for access to this collection posteriorly due to major iliac vessels as well as the bony pelvis. 2. Persistent dilatation of small bowel likely consistent with ileus. Component of partial obstruction is not excluded. Electronically Signed   By: Aletta Edouard M.D.   On: 03/16/2020 10:50   CT ABDOMEN PELVIS W CONTRAST  Result Date: 03/14/2020 CLINICAL DATA:  Right-sided abdominal pain EXAM: CT ABDOMEN AND PELVIS WITH CONTRAST TECHNIQUE: Multidetector CT imaging of the abdomen and pelvis was performed using the standard protocol following bolus administration of intravenous contrast.  CONTRAST:  173m OMNIPAQUE IOHEXOL 300 MG/ML  SOLN COMPARISON:  03/06/2020 FINDINGS: Lower chest: Mild dependent atelectatic changes are noted bilaterally. Hepatobiliary: Liver and gallbladder are within normal limits. There is new free fluid surrounding the majority of the liver when compared with the prior exam. Pancreas: Unremarkable. No pancreatic ductal dilatation or surrounding inflammatory changes. Spleen: Normal in size without focal abnormality. Adrenals/Urinary Tract: Adrenal glands are within normal limits. The kidneys demonstrate a normal enhancement pattern bilaterally. No renal calculi are seen. No obstructive changes are noted. The bladder is decompressed. Stomach/Bowel: The stomach is within normal limits. Mild fluid dilatation of the small bowel is noted without definitive obstructive changes. The colon is predominately decompressed. The appendix is well visualized and shows no inflammatory changes. Mild fluid is noted surrounding the small bowel loops consistent with enteritis. There are peripherally enhancing fluid collections identified deep within the pelvis. One of these is noted anterior to  the rectum in the pelvic cul-de-sac measuring 5.8 x 3.3 cm best seen on image number 74 of series 2. These do not correspond to any bowel loops and are felt to represent developing abscesses. No definitive free air is seen. This previously represented some minimal free fluid within the pelvis. A second collection is noted just superior to the left measuring 4.5 cm best seen on image number 73 of series 2. Vascular/Lymphatic: No significant vascular findings are present. No enlarged abdominal or pelvic lymph nodes. Reproductive: Status post hysterectomy. No adnexal masses. Other: As described above there are 2 areas of peripherally enhancing fluid collection deep within the pelvis consistent with developing abscesses. Some free fluid is also noted within the abdomen likely reactive in nature.  Musculoskeletal: No acute or significant osseous findings. IMPRESSION: Peripherally enhancing fluid collections deep within the pelvis consistent with developing abscesses. Free fluid is also noted within the abdomen. These changes are new from the prior exam. Fluid-filled loops of small bowel with mild mucosal hyperemia consistent with enteritis and likely in part reactive to the changes in the pelvis. Normal-appearing appendix. Electronically Signed   By: Inez Catalina M.D.   On: 03/14/2020 21:32   CT ABDOMEN PELVIS W CONTRAST  Result Date: 03/06/2020 CLINICAL DATA:  Right lower quadrant abdominal pain. EXAM: CT ABDOMEN AND PELVIS WITH CONTRAST TECHNIQUE: Multidetector CT imaging of the abdomen and pelvis was performed using the standard protocol following bolus administration of intravenous contrast. CONTRAST:  167mL OMNIPAQUE IOHEXOL 300 MG/ML  SOLN COMPARISON:  None. FINDINGS: Lower chest: Choose bilateral pleural effusions and basilar atelectasis. Heart is normal in size. Hepatobiliary: No focal liver abnormality is seen. No gallstones, gallbladder wall thickening, or biliary dilatation. Pancreas: No ductal dilatation or inflammation. Spleen: Normal in size without focal abnormality. Adrenals/Urinary Tract: Normal adrenal glands. No hydronephrosis or perinephric edema. Homogeneous renal enhancement with symmetric excretion on delayed phase imaging. Urinary bladder is partially distended without wall thickening. Stomach/Bowel: Bowel evaluation is limited in the absence of enteric contrast. Stomach is decompressed. Normal positioning of the duodenum and ligament of Treitz. There are scattered fluid-filled small bowel loops with occasional areas of wall thickening. No bowel dilatation to suggest obstruction. Appendix is tentatively visualized and normal, series 2, image 64. No evidence of appendicitis. Liquid stool in the cecum and ascending colon. Remainder of the colon is decompressed. There is interposition  of the splenic flexure posterior to the spleen under the hemidiaphragm. Scattered diverticula noted at the splenic flexure of the colon. No diverticulitis or acute colonic inflammation. Vascular/Lymphatic: The abdominal aorta is normal in caliber. Portal vein is patent. No portal venous or mesenteric gas. Multiple small retroperitoneal mesenteric nodes are not enlarged by size criteria. There is a small epitrochlear node measuring 8 mm. Reproductive: Hysterectomy with soft tissue prominence in the region of the cervix. Ovary is not definitively visualized on CT. Other: Small amount of scattered free fluid primarily in the pelvis. Mild edema of the mesentery and anterior omental fat. No free air or focal fluid collection. Musculoskeletal: There are no acute or suspicious osseous abnormalities. IMPRESSION: 1. No evidence of appendicitis. 2. Scattered fluid-filled small bowel loops with occasional areas of wall thickening, suggesting enteritis. No obstruction. 3. Small amount of scattered free fluid in the pelvis, likely reactive. Mild edema of the mesentery and anterior omental fat, typically reactive. 4. Trace bilateral pleural effusions and basilar atelectasis. Electronically Signed   By: Keith Rake M.D.   On: 03/06/2020 22:50   DG Chest Portable  1 View  Result Date: 03/14/2020 CLINICAL DATA:  COVID positive.  Fever and abdominal pain EXAM: PORTABLE CHEST 1 VIEW COMPARISON:  09/21/2015 FINDINGS: Heart size and pulmonary vascularity are normal. Lungs are clear. No airspace disease or consolidation. No pleural effusions. No pneumothorax. Mediastinal contours appear intact. IMPRESSION: No active disease. Electronically Signed   By: Lucienne Capers M.D.   On: 03/14/2020 21:22   DG Abd Portable 1V  Result Date: 03/17/2020 CLINICAL DATA:  Abdominal pain EXAM: PORTABLE ABDOMEN - 1 VIEW COMPARISON:  CT abdomen and pelvis March 14, 2020 FINDINGS: There are loops of dilated small bowel without air-fluid  levels. No free air. No abnormal calcifications. IMPRESSION: Dilated loops of small bowel raising concern for potential degree of bowel obstruction. Ileus and enteritis are differential considerations. Electronically Signed   By: Lowella Grip III M.D.   On: 03/17/2020 08:11      Subjective: She is feeling well, tolerating diet, drinking fluids well.   Discharge Exam: Vitals:   03/22/20 1300 03/22/20 2146  BP: 129/85 135/88  Pulse: 77 73  Resp: 16 18  Temp: 98.6 F (37 C) 98.5 F (36.9 C)  SpO2: 100% 100%     General: Pt is alert, awake, not in acute distress Cardiovascular: RRR, S1/S2 +, no rubs, no gallops Respiratory: CTA bilaterally, no wheezing, no rhonchi Abdominal: Soft, NT, ND, bowel sounds +, JP drain in place.  Extremities: no edema, no cyanosis    The results of significant diagnostics from this hospitalization (including imaging, microbiology, ancillary and laboratory) are listed below for reference.     Microbiology: Recent Results (from the past 240 hour(s))  SARS Coronavirus 2 by RT PCR (hospital order, performed in [facility] hospital lab) Nasopharyngeal Peripheral     Status: Abnormal   Collection Time: 03/14/20  7:20 PM   Specimen: Peripheral; Nasopharyngeal  Result Value Ref Range Status   SARS Coronavirus 2 POSITIVE (A) NEGATIVE Final    Comment: RESULT CALLED TO, READ BACK BY AND VERIFIED WITH: NEAL KELLIE RN AT 2044 ON 03/14/20 BY I.[email or web address] (NOTE) SARS-CoV-2 target nucleic acids are DETECTED  SARS-CoV-2 RNA is generally detectable in upper respiratory specimens  during the acute phase of infection.  Positive results are indicative  of the presence of the identified virus, but do not rule out bacterial infection or co-infection with other pathogens not detected by the test.  Clinical correlation with patient history and  other diagnostic information is necessary to determine patient infection status.  The expected result is negative.  Fact  Sheet for Patients:   StrictlyIdeas.no   Fact Sheet for Healthcare Providers:   BankingDealers.co.za    This test is not yet approved or cleared by the [place] FDA and  has been authorized for detection and/or diagnosis of SARS-CoV-2 by FDA under an Emergency Use Authorization (EUA).  This EUA will remain in effect (meani ng this test can be used) for the duration of  the COVID-19 declaration under Section 564(b)(1) of the Act, 21 U.S.C. section 360-bbb-3(b)(1), unless the authorization is terminated or revoked sooner.  Performed at [facility], [address] [place], [place] [address]   Blood Culture (routine x 2)     Status: Abnormal   Collection Time: 03/14/20  7:30 PM   Specimen: BLOOD  Result Value Ref Range Status   Specimen Description   Final    BLOOD RIGHT ANTECUBITAL Performed at [facility], [address], [place], [place] [address]  Special Requests   Final    BOTTLES DRAWN AEROBIC AND ANAEROBIC Blood Culture adequate volume Performed at [facility], [address] [place], [place] [address]    Culture  Setup Time   Final    GRAM POSITIVE COCCI IN CHAINS ANAEROBIC BOTTLE ONLY CRITICAL RESULT CALLED TO, READ BACK BY AND VERIFIED WITH: A. Rogers Blocker PharmD 9:30 03/18/20 ([email or web address])    Culture (A)  Final    PEPTOSTREPTOCOCCUS MICROS Standardized susceptibility testing for this organism is not available. Performed at [facility], [place] [address], [place], [place] [address]    Report Status 03/20/2020 FINAL  Final  Blood Culture ID Panel (Reflexed)     Status: None   Collection Time: 03/14/20  7:30 PM  Result Value Ref Range Status   Enterococcus faecalis NOT DETECTED NOT DETECTED Final   Enterococcus Faecium NOT DETECTED NOT DETECTED Final   Listeria monocytogenes NOT DETECTED NOT DETECTED Final   Staphylococcus species NOT DETECTED NOT DETECTED Final   Staphylococcus  aureus (BCID) NOT DETECTED NOT DETECTED Final   Staphylococcus epidermidis NOT DETECTED NOT DETECTED Final   Staphylococcus lugdunensis NOT DETECTED NOT DETECTED Final   Streptococcus species NOT DETECTED NOT DETECTED Final   Streptococcus agalactiae NOT DETECTED NOT DETECTED Final   Streptococcus pneumoniae NOT DETECTED NOT DETECTED Final   Streptococcus pyogenes NOT DETECTED NOT DETECTED Final   A.calcoaceticus-baumannii NOT DETECTED NOT DETECTED Final   Bacteroides fragilis NOT DETECTED NOT DETECTED Final   Enterobacterales NOT DETECTED NOT DETECTED Final   Enterobacter cloacae complex NOT DETECTED NOT DETECTED Final   Escherichia coli NOT DETECTED NOT DETECTED Final   Klebsiella aerogenes NOT DETECTED NOT DETECTED Final   Klebsiella oxytoca NOT DETECTED NOT DETECTED Final   Klebsiella pneumoniae NOT DETECTED NOT DETECTED Final   Proteus species NOT DETECTED NOT DETECTED Final   Salmonella species NOT DETECTED NOT DETECTED Final   Serratia marcescens NOT DETECTED NOT DETECTED Final   Haemophilus influenzae NOT DETECTED NOT DETECTED Final   Neisseria meningitidis NOT DETECTED NOT DETECTED Final   Pseudomonas aeruginosa NOT DETECTED NOT DETECTED Final   Stenotrophomonas maltophilia NOT DETECTED NOT DETECTED Final   Candida albicans NOT DETECTED NOT DETECTED Final   Candida auris NOT DETECTED NOT DETECTED Final   Candida glabrata NOT DETECTED NOT DETECTED Final   Candida krusei NOT DETECTED NOT DETECTED Final   Candida parapsilosis NOT DETECTED NOT DETECTED Final   Candida tropicalis NOT DETECTED NOT DETECTED Final   Cryptococcus neoformans/gattii NOT DETECTED NOT DETECTED Final    Comment: Performed at [facility], [place] [address] [place], [place] [address]  Blood Culture (routine x 2)     Status: None   Collection Time: 03/15/20 11:57 AM   Specimen: BLOOD  Result Value Ref Range Status   Specimen Description BLOOD LEFT ANTECUBITAL  Final   Special Requests   Final     BOTTLES DRAWN AEROBIC ONLY Blood Culture adequate volume   Culture   Final    NO GROWTH 5 DAYS Performed at [facility], [place] [address], [place], [place] [address]    Report Status 03/20/2020 FINAL  Final     Labs: BNP (last 3 results) No results for input(s): BNP in the last 8760 hours. Basic Metabolic Panel: Recent Labs  Lab 03/19/20 0345 03/20/20 0456 03/21/20 1615 03/22/20 1204 03/23/20 0117  NA 143 146* 141 142 140  K 4.0 3.9 3.6 3.4* 3.8  CL 114* 116* 114* 115*  113*  CO2 17* 17* 17* 16* 14*  GLUCOSE 156* 117* 161* 139* 156*  BUN 47* 41* 26* 22* 20  CREATININE 2.98* 2.64* 2.27* 2.26* 2.22*  CALCIUM 6.6* 6.3* 6.5* 6.7* 7.1*  MG 1.9 1.8  --   --   --   PHOS  --   --  2.8  --  3.4   Liver Function Tests: Recent Labs  Lab 03/18/20 0425 03/21/20 1615 03/23/20 0117  AST 35  --   --   ALT 22  --   --   ALKPHOS 67  --   --   BILITOT 1.1  --   --   PROT 5.3*  --   --   ALBUMIN 1.8* 1.8* 1.8*   No results for input(s): LIPASE, AMYLASE in the last 168 hours. No results for input(s): AMMONIA in the last 168 hours. CBC: Recent Labs  Lab 03/19/20 0345 03/20/20 0456 03/21/20 1615 03/22/20 1204 03/23/20 0117  WBC 10.8* 9.2 14.1* 15.1* 12.2*  HGB 12.1 11.0* 10.8* 11.1* 12.5  HCT 37.6 32.7* 32.4* 32.9* 39.2  MCV 80.7 79.8* 79.8* 79.7* 83.2  PLT 158 157 263 220 231   Cardiac Enzymes: No results for input(s): CKTOTAL, CKMB, CKMBINDEX, TROPONINI in the last 168 hours. BNP: Invalid input(s): POCBNP CBG: Recent Labs  Lab 03/22/20 0842 03/22/20 1211 03/22/20 1702 03/22/20 2146 03/23/20 0800  GLUCAP 137* 107* 152* 158* 159*   D-Dimer No results for input(s): DDIMER in the last 72 hours. Hgb A1c No results for input(s): HGBA1C in the last 72 hours. Lipid Profile No results for input(s): CHOL, HDL, LDLCALC, TRIG, CHOLHDL, LDLDIRECT in the last 72 hours. Thyroid function studies No results for input(s): TSH, T4TOTAL, T3FREE, THYROIDAB in the last 72  hours.  Invalid input(s): FREET3 Anemia work up No results for input(s): VITAMINB12, FOLATE, FERRITIN, TIBC, IRON, RETICCTPCT in the last 72 hours. Urinalysis    Component Value Date/Time   COLORURINE YELLOW 03/06/2020 1537   APPEARANCEUR CLEAR 03/06/2020 1537   LABSPEC 1.030 03/06/2020 1537   PHURINE 5.0 03/06/2020 1537   GLUCOSEU NEGATIVE 03/06/2020 1537   HGBUR SMALL (A) 03/06/2020 1537   BILIRUBINUR SMALL (A) 03/06/2020 1537   KETONESUR 15 (A) 03/06/2020 1537   PROTEINUR NEGATIVE 03/06/2020 1537   UROBILINOGEN 0.2 09/26/2014 0830   NITRITE NEGATIVE 03/06/2020 1537   LEUKOCYTESUR NEGATIVE 03/06/2020 1537   Sepsis Labs Invalid input(s): PROCALCITONIN,  WBC,  LACTICIDVEN Microbiology Recent Results (from the past 240 hour(s))  SARS Coronavirus 2 by RT PCR (hospital order, performed in [facility] hospital lab) Nasopharyngeal Peripheral     Status: Abnormal   Collection Time: 03/14/20  7:20 PM   Specimen: Peripheral; Nasopharyngeal  Result Value Ref Range Status   SARS Coronavirus 2 POSITIVE (A) NEGATIVE Final    Comment: RESULT CALLED TO, READ BACK BY AND VERIFIED WITH: NEAL KELLIE RN AT 2044 ON 03/14/20 BY I.SUGUT (NOTE) SARS-CoV-2 target nucleic acids are DETECTED  SARS-CoV-2 RNA is generally detectable in upper respiratory specimens  during the acute phase of infection.  Positive results are indicative  of the presence of the identified virus, but do not rule out bacterial infection or co-infection with other pathogens not detected by the test.  Clinical correlation with patient history and  other diagnostic information is necessary to determine patient infection status.  The expected result is negative.  Fact Sheet for Patients:   StrictlyIdeas.no   Fact Sheet for Healthcare Providers:   BankingDealers.co.za    This test is  not yet approved or cleared by the [place] and  has been authorized for detection  and/or diagnosis of SARS-CoV-2 by FDA under an Emergency Use Authorization (EUA).  This EUA will remain in effect (meani ng this test can be used) for the duration of  the COVID-19 declaration under Section 564(b)(1) of the Act, 21 U.S.C. section 360-bbb-3(b)(1), unless the authorization is terminated or revoked sooner.  Performed at [facility], [address] [place], [place] [address]   Blood Culture (routine x 2)     Status: Abnormal   Collection Time: 03/14/20  7:30 PM   Specimen: BLOOD  Result Value Ref Range Status   Specimen Description   Final    BLOOD RIGHT ANTECUBITAL Performed at [facility], [address] [place], [place] [address]    Special Requests   Final    BOTTLES DRAWN AEROBIC AND ANAEROBIC Blood Culture adequate volume Performed at [facility], [address] [place], [place] [address]    Culture  Setup Time   Final    GRAM POSITIVE COCCI IN CHAINS ANAEROBIC BOTTLE ONLY CRITICAL RESULT CALLED TO, READ BACK BY AND VERIFIED WITH: A. Rogers Blocker PharmD 9:30 03/18/20 ([email or web address])    Culture (A)  Final    PEPTOSTREPTOCOCCUS MICROS Standardized susceptibility testing for this organism is not available. Performed at [facility], [place] [address], [place], [place] [address]    Report Status 03/20/2020 FINAL  Final  Blood Culture ID Panel (Reflexed)     Status: None   Collection Time: 03/14/20  7:30 PM  Result Value Ref Range Status   Enterococcus faecalis NOT DETECTED NOT DETECTED Final   Enterococcus Faecium NOT DETECTED NOT DETECTED Final   Listeria monocytogenes NOT DETECTED NOT DETECTED Final   Staphylococcus species NOT DETECTED NOT DETECTED Final   Staphylococcus aureus (BCID) NOT DETECTED NOT DETECTED Final   Staphylococcus epidermidis NOT DETECTED NOT DETECTED Final   Staphylococcus lugdunensis NOT DETECTED NOT DETECTED Final   Streptococcus species NOT DETECTED NOT DETECTED Final   Streptococcus agalactiae NOT  DETECTED NOT DETECTED Final   Streptococcus pneumoniae NOT DETECTED NOT DETECTED Final   Streptococcus pyogenes NOT DETECTED NOT DETECTED Final   A.calcoaceticus-baumannii NOT DETECTED NOT DETECTED Final   Bacteroides fragilis NOT DETECTED NOT DETECTED Final   Enterobacterales NOT DETECTED NOT DETECTED Final   Enterobacter cloacae complex NOT DETECTED NOT DETECTED Final   Escherichia coli NOT DETECTED NOT DETECTED Final   Klebsiella aerogenes NOT DETECTED NOT DETECTED Final   Klebsiella oxytoca NOT DETECTED NOT DETECTED Final   Klebsiella pneumoniae NOT DETECTED NOT DETECTED Final   Proteus species NOT DETECTED NOT DETECTED Final   Salmonella species NOT DETECTED NOT DETECTED Final   Serratia marcescens NOT DETECTED NOT DETECTED Final   Haemophilus influenzae NOT DETECTED NOT DETECTED Final   Neisseria meningitidis NOT DETECTED NOT DETECTED Final   Pseudomonas aeruginosa NOT DETECTED NOT DETECTED Final   Stenotrophomonas maltophilia NOT DETECTED NOT DETECTED Final   Candida albicans NOT DETECTED NOT DETECTED Final   Candida auris NOT DETECTED NOT DETECTED Final   Candida glabrata NOT DETECTED NOT DETECTED Final   Candida krusei NOT DETECTED NOT DETECTED Final   Candida parapsilosis NOT DETECTED NOT DETECTED Final   Candida tropicalis NOT DETECTED NOT DETECTED Final   Cryptococcus neoformans/gattii NOT DETECTED NOT DETECTED Final    Comment: Performed at [facility], [place] [address] [place], [place] [address]  Blood  Culture (routine x 2)     Status: None   Collection Time: 03/15/20 11:57 AM   Specimen: BLOOD  Result Value Ref Range Status   Specimen Description BLOOD LEFT ANTECUBITAL  Final   Special Requests   Final    BOTTLES DRAWN AEROBIC ONLY Blood Culture adequate volume   Culture   Final    NO GROWTH 5 DAYS Performed at [facility], [address] [place], [place] [address]    Report Status 03/20/2020 FINAL  Final     Time coordinating discharge: 40  minutes  SIGNED:   Elmarie Shiley, MD  [facility]

## 2020-03-23 NOTE — Progress Notes (Addendum)
[facility] Progress Note   Subjective:   Tolerating diet.  No new issues.  UOP documented yesterday but she thinks more.  Foley out - no difficulty voiding.   Objective Vitals:   03/21/20 2154 03/22/20 0619 03/22/20 1300 03/22/20 2146  BP: 124/77 127/80 129/85 135/88  Pulse: 87 78 77 73  Resp: 17 17 16 18   Temp: (!) 97.5 F (36.4 C) 98 F (36.7 C) 98.6 F (37 C) 98.5 F (36.9 C)  TempSrc:    Oral  SpO2: 100% 100% 100% 100%  Weight:      Height:       Physical Exam General: sitting up in bed Heart:RRR Lungs: clear Abdomen: soft, mildl TTP; drain in place Extremities: no edema GU: foley out  Additional Objective Labs: Basic Metabolic Panel: Recent Labs  Lab 03/21/20 1615 03/22/20 1204 03/23/20 0117  NA 141 142 140  K 3.6 3.4* 3.8  CL 114* 115* 113*  CO2 17* 16* 14*  GLUCOSE 161* 139* 156*  BUN 26* 22* 20  CREATININE 2.27* 2.26* 2.22*  CALCIUM 6.5* 6.7* 7.1*  PHOS 2.8  --  3.4   Liver Function Tests: Recent Labs  Lab 03/18/20 0425 03/21/20 1615 03/23/20 0117  AST 35  --   --   ALT 22  --   --   ALKPHOS 67  --   --   BILITOT 1.1  --   --   PROT 5.3*  --   --   ALBUMIN 1.8* 1.8* 1.8*   No results for input(s): LIPASE, AMYLASE in the last 168 hours. CBC: Recent Labs  Lab 03/19/20 0345 03/20/20 0456 03/21/20 1615 03/22/20 1204 03/23/20 0117  WBC 10.8* 9.2 14.1* 15.1* 12.2*  HGB 12.1 11.0* 10.8* 11.1* 12.5  HCT 37.6 32.7* 32.4* 32.9* 39.2  MCV 80.7 79.8* 79.8* 79.7* 83.2  PLT 158 157 263 220 231   Blood Culture    Component Value Date/Time   SDES BLOOD LEFT ANTECUBITAL 03/15/2020 1157   SPECREQUEST  03/15/2020 1157    BOTTLES DRAWN AEROBIC ONLY Blood Culture adequate volume   CULT  03/15/2020 1157    NO GROWTH 5 DAYS Performed at [facility], [address], [place], [place]    REPTSTATUS 03/20/2020 FINAL 03/15/2020 1157    Cardiac Enzymes: No results for input(s): CKTOTAL, CKMB, CKMBINDEX,  TROPONINI in the last 168 hours. CBG: Recent Labs  Lab 03/22/20 0842 03/22/20 1211 03/22/20 1702 03/22/20 2146 03/23/20 0800  GLUCAP 137* 107* 152* 158* 159*   Iron Studies: No results for input(s): IRON, TIBC, TRANSFERRIN, FERRITIN in the last 72 hours. @lablastinr3 @ Studies/Results: No results found. Medications:  . amoxicillin-clavulanate  1 tablet Oral Q12H  . Chlorhexidine Gluconate Cloth  6 each Topical Daily  . doxycycline  100 mg Oral Q12H  . enoxaparin (LOVENOX) injection  30 mg Subcutaneous Q24H  . famotidine  20 mg Oral Daily  . insulin aspart  0-6 Units Subcutaneous TID WC  . sodium bicarbonate  1,300 mg Oral TID    Assessment/Plan: **sepsis secondary to pelvic abscesses:  On antibiotic coverage and s/p surgical wash out.  Management per primary.  I don't think she has AIN so no need to change abx at this time.   **AKI: Suspect multifactorial with hypovolemia, hypotension with concurrent ACEi, NSAID, contrast.  Cr peak 3.  CT without evidence of obstruction.  Foley out and no voiding issues. UA with small blood, suspect from foley, no protein.  Cont to  hold ACEi, maintain euvolemia. Off IVF x 24h, continue oral hydration.  Labs stable and may take a few weeks to totally recover.  I'll see her in clinic in 2 weeks with labs a few days prior at [facility] - she's aware and has my card.  My office will reach out to her with schedule>  Avoid nephrotoxins and hypotension as able.    **Metabolic acidosis: secondary to AKI; mild.  ^ na bicarb po. Discharge on na bicarb 1300mg  po TID.  **HTN:  BP currently acceptable without meds.    **DM: well controlled, no metformin for now.   **HypoCa:  Corrects to 8.1.   She is supposed to d/c today - will sign off.  MD 03/23/2020, 9:08 AM  [facility] Pager: [phone]

## 2020-03-23 NOTE — Progress Notes (Signed)
Occupational Therapy Treatment Patient Details Name: Katherine Cabrera MRN: [id number] DOB: March 07, 1975 Today's Date: 03/23/2020    History of present illness Katherine Cabrera is a 46 y.o. female admitted for management of pelvic abscess. Previously underwent hysterectomy and hernia repair in November 2021. Recover was going well and started noticing some bleeding. PMHx:  COVID 03/01/20, T2DM, hypotension.   OT comments  Pt progressing well towards OT goals, endorses fatigue after LB ADLs. Provided energy conservation education to manage fatigue but maximize independence at home. Pt overall Setup for LB dressing tasks without AE today. Provided cueing to try positional strategies to increase ease of reaching B feet with pt able to return demo with increased time. Pt reports her spouse or children can assist as needed with daily tasks. Pt with good family support at home. Continue to anticipate no OT needs at discharge.    Follow Up Recommendations  No OT follow up;Supervision - Intermittent    Equipment Recommendations  3 in 1 bedside commode    Recommendations for Other Services      Precautions / Restrictions Precautions Precautions: Fall;Other (comment) Precaution Comments: JP drain Restrictions Weight Bearing Restrictions: No       Mobility Bed Mobility Overal bed mobility: Modified Independent                Transfers                      Balance Overall balance assessment: Mild deficits observed, not formally tested                                         ADL either performed or assessed with clinical judgement   ADL Overall ADL's : Needs assistance/impaired                     Lower Body Dressing: Set up Lower Body Dressing Details (indicate cue type and reason): Pt able to demonstrate donning leggings and socks without AE. Cued for problem solving to prop LE up on bed with knee flexed to improve ability to reach. Pt reports family  can also assist with this task at home               General ADL Comments: Pt with increased time needed for LB ADLs, but able to demo using compensatory strategies rather than AE today     Vision   Vision Assessment?: No apparent visual deficits   Perception     Praxis      Cognition Arousal/Alertness: Awake/alert Behavior During Therapy: WFL for tasks assessed/performed Overall Cognitive Status: Within Functional Limits for tasks assessed                                 General Comments: slower response time but A&Ox4, appropriate throughout session        Exercises     Shoulder Instructions       General Comments Pt endorsed fatigue after LB dressing tasks. Educated on safety and energy conservation strategies to use at home during ADLs. Pt reports completing sponge bathing tasks standing at sink in room, encouraged to perform task seated at home if fatigued    Pertinent Vitals/ Pain       Pain Assessment: Faces Faces Pain Scale: Hurts a little bit Pain Location: JP  drain site; soreness Pain Descriptors / Indicators: Sore Pain Intervention(s): Monitored during session;Repositioned  Home Living                                          Prior Functioning/Environment              Frequency  Min 2X/week        Progress Toward Goals  OT Goals(current goals can now be found in the care plan section)  Progress towards OT goals: Progressing toward goals  Acute Rehab OT Goals Patient Stated Goal: to get to feeling better OT Goal Formulation: With patient Time For Goal Achievement: 04/02/20 Potential to Achieve Goals: Good ADL Goals Pt Will Perform Lower Body Dressing: with supervision;sitting/lateral leans;sit to/from stand Pt Will Perform Toileting - Clothing Manipulation and hygiene: with supervision;sitting/lateral leans;sit to/from stand Additional ADL Goal #1: pt will tolerate x10 mins of OOB ADL with 2 seated rest  break in order to increase independence with ADL.  Plan Discharge plan remains appropriate    Co-evaluation                 AM-PAC OT "6 Clicks" Daily Activity     Outcome Measure   Help from another person eating meals?: None Help from another person taking care of personal grooming?: None Help from another person toileting, which includes using toliet, bedpan, or urinal?: A Little Help from another person bathing (including washing, rinsing, drying)?: A Little Help from another person to put on and taking off regular upper body clothing?: None Help from another person to put on and taking off regular lower body clothing?: A Little 6 Click Score: 21    End of Session    OT Visit Diagnosis: Unsteadiness on feet (R26.81);Muscle weakness (generalized) (M62.81);Pain Pain - part of body:  (at drain sites)   Activity Tolerance Patient tolerated treatment well   Patient Left in bed;with call bell/phone within reach   Nurse Communication          Time: 0630-1601 OT Time Calculation (min): 20 min  Charges: OT General Charges $OT Visit: 1 Visit OT Treatments $Self Care/Home Management : 8-22 mins  Lorre Munroe, OTR/L   Lorre Munroe 03/23/2020, 10:53 AM

## 2020-03-24 ENCOUNTER — Telehealth: Payer: Self-pay | Admitting: *Deleted

## 2020-03-24 NOTE — Telephone Encounter (Signed)
-----   Message from Judyann Munson, MD sent at 03/21/2020  4:20 PM EST ----- Can I see back in 10 days

## 2020-03-24 NOTE — Telephone Encounter (Signed)
Left message asking patient to call back regarding appointment.  Dr Drue Second has availability the afternoon of 04/03/20. Will send mychart message as well. Andree Coss, RN

## 2020-03-26 ENCOUNTER — Telehealth: Payer: Self-pay

## 2020-03-26 NOTE — Telephone Encounter (Signed)
-----   Message from Cynthia Snider, MD sent at 03/21/2020  4:20 PM EST ----- Can I see back in 10 days  

## 2020-04-03 ENCOUNTER — Ambulatory Visit: Payer: BC Managed Care – PPO | Admitting: Internal Medicine

## 2020-10-10 ENCOUNTER — Other Ambulatory Visit: Payer: Self-pay

## 2020-10-10 ENCOUNTER — Emergency Department (HOSPITAL_BASED_OUTPATIENT_CLINIC_OR_DEPARTMENT_OTHER)
Admission: EM | Admit: 2020-10-10 | Discharge: 2020-10-11 | Disposition: A | Discharge status: Home / Self Care | Payer: BC Managed Care – PPO | Attending: Emergency Medicine | Admitting: Emergency Medicine

## 2020-10-10 ENCOUNTER — Encounter (HOSPITAL_BASED_OUTPATIENT_CLINIC_OR_DEPARTMENT_OTHER): Payer: Self-pay | Admitting: *Deleted

## 2020-10-10 DIAGNOSIS — Z79899 Other long term (current) drug therapy: Secondary | ICD-10-CM | POA: Insufficient documentation

## 2020-10-10 DIAGNOSIS — G5139 Clonic hemifacial spasm, unspecified: Secondary | ICD-10-CM | POA: Diagnosis present

## 2020-10-10 DIAGNOSIS — Z8616 Personal history of COVID-19: Secondary | ICD-10-CM | POA: Diagnosis not present

## 2020-10-10 DIAGNOSIS — G514 Facial myokymia: Secondary | ICD-10-CM

## 2020-10-10 DIAGNOSIS — E119 Type 2 diabetes mellitus without complications: Secondary | ICD-10-CM | POA: Insufficient documentation

## 2020-10-10 DIAGNOSIS — I10 Essential (primary) hypertension: Secondary | ICD-10-CM | POA: Diagnosis not present

## 2020-10-10 NOTE — ED Triage Notes (Signed)
Pt states that she was having spasms in her left upper eyelid and right cheek today at work and at home her husband felt she has some slurred speech, no focal weakness noted in triage, pt denies any focal weakness, pt states that she did not note that she had slurred speech but that her husband noticed it.

## 2020-10-11 ENCOUNTER — Emergency Department (HOSPITAL_BASED_OUTPATIENT_CLINIC_OR_DEPARTMENT_OTHER): Payer: BC Managed Care – PPO

## 2020-10-11 ENCOUNTER — Encounter (HOSPITAL_BASED_OUTPATIENT_CLINIC_OR_DEPARTMENT_OTHER): Payer: Self-pay | Admitting: Emergency Medicine

## 2020-10-11 LAB — BASIC METABOLIC PANEL
Anion gap: 6 (ref 5–15)
BUN: 19 mg/dL (ref 6–20)
CO2: 26 mmol/L (ref 22–32)
Calcium: 8.9 mg/dL (ref 8.9–10.3)
Chloride: 103 mmol/L (ref 98–111)
Creatinine, Ser: 0.97 mg/dL (ref 0.44–1.00)
GFR, Estimated: >60 mL/min (ref >60)
Glucose, Bld: 93 mg/dL (ref 70–99)
Potassium: 4.8 mmol/L (ref 3.5–5.1)
Sodium: 135 mmol/L (ref 135–145)

## 2020-10-11 LAB — CBC WITH DIFFERENTIAL/PLATELET
Abs Immature Granulocytes: 0.01 10*3/uL (ref 0.00–0.07)
Basophils Absolute: 0 10*3/uL (ref 0.0–0.1)
Basophils Relative: 1 %
Eosinophils Absolute: 0.5 10*3/uL (ref 0.0–0.5)
Eosinophils Relative: 9 %
HCT: 34.9 % — ABNORMAL LOW (ref 36.0–46.0)
Hemoglobin: 11.5 g/dL — ABNORMAL LOW (ref 12.0–15.0)
Immature Granulocytes: 0 %
Lymphocytes Relative: 34 %
Lymphs Abs: 1.8 10*3/uL (ref 0.7–4.0)
MCH: 27.6 pg (ref 26.0–34.0)
MCHC: 33 g/dL (ref 30.0–36.0)
MCV: 83.9 fL (ref 80.0–100.0)
Monocytes Absolute: 0.5 10*3/uL (ref 0.1–1.0)
Monocytes Relative: 9 %
Neutro Abs: 2.7 10*3/uL (ref 1.7–7.7)
Neutrophils Relative %: 47 %
Platelets: 205 10*3/uL (ref 150–400)
RBC: 4.16 MIL/uL (ref 3.87–5.11)
RDW: 14.6 % (ref 11.5–15.5)
WBC: 5.4 10*3/uL (ref 4.0–10.5)
nRBC: 0 % (ref 0.0–0.2)

## 2020-10-11 LAB — MAGNESIUM: Magnesium: 1.7 mg/dL (ref 1.7–2.4)

## 2020-10-11 LAB — PREGNANCY, URINE: Preg Test, Ur: NEGATIVE

## 2020-10-11 MED ORDER — IOHEXOL 350 MG/ML SOLN
100.0000 mL | Freq: Once | INTRAVENOUS | Status: AC | PRN
  Administered 2020-10-11: 100 mL via INTRAVENOUS

## 2020-10-11 NOTE — ED Notes (Signed)
Discharge instructions reviewed and all questions answered. Patient left ED with ABCs intact, alert and oriented x4, respirations even and unlabored.

## 2020-10-11 NOTE — ED Provider Notes (Signed)
[facility] EMERGENCY DEPARTMENT Provider Note   CSN: [id number] Arrival date & time: 10/10/20  2327     History Chief Complaint  Patient presents with  . facial spasms    Katherine Cabrera is a 45 y.o. female.  The history is provided by the patient.  Illness Location:  Left eye and right cheek Quality:  Spasming, states husband says speech is hard to understand but she does not notic a difference Severity:  Moderate Onset quality:  Gradual Duration:  1 day Timing:  Intermittent Progression:  Unchanged Chronicity:  New Context:  Diabetic Relieved by:  Nothing Worsened by:  Nothing Ineffective treatments:  None Associated symptoms: no abdominal pain, no chest pain, no congestion, no cough, no diarrhea, no ear pain, no fatigue, no fever, no headaches, no loss of consciousness, no myalgias, no nausea, no rash, no rhinorrhea, no shortness of breath, no sore throat, no vomiting and no wheezing   Associated symptoms comment:  No weakness nor numbness, no changes in vision.  No difficulty making speech.  Risk factors:  Diabetes     Past Medical History:  Diagnosis Date  . De Quervain's disease (radial styloid tenosynovitis)   . Diabetes mellitus without complication (HCC)   . Hypertension     Patient Active Problem List   Diagnosis Date Noted  . Neutropenia (HCC)   . Intraabdominal fluid collection   . S/P hysterectomy   . Abscess of female pelvis 03/15/2020  . Transient hypotension 03/15/2020  . Enteritis 03/15/2020  . COVID-19 virus infection 03/15/2020  . Normocytic anemia 03/15/2020  . Type 2 diabetes mellitus with hyperlipidemia (HCC) 03/15/2020  . Sepsis (HCC) 03/14/2020    Past Surgical History:  Procedure Laterality Date  . ABDOMINAL HYSTERECTOMY    . CESAREAN SECTION    . HERNIA REPAIR    . LAPAROSCOPY N/A 03/17/2020   Procedure: LAPAROSCOPY DIAGNOSTIC;  ABDOMINAL WASH OUT, PELVIC EXAM UNDER ANESTHESIA, RIGID SIGMOIDOSCOPY;  Surgeon: Berna Bue, MD;  Location: [facility] OR;  Service: General;  Laterality: N/A;     OB History   No obstetric history on file.     History reviewed. No pertinent family history.  Social History   Tobacco Use  . Smoking status: Never  . Smokeless tobacco: Never  Substance Use Topics  . Alcohol use: No  . Drug use: No    Home Medications Prior to Admission medications   Medication Sig Start Date End Date Taking? Authorizing Provider  acetaminophen (TYLENOL) 325 MG tablet Take 2 tablets (650 mg total) by mouth every 6 (six) hours as needed for mild pain (or Fever >/= 101). 03/23/20   Regalado, Belkys A, MD  atorvastatin (LIPITOR) 40 MG tablet Take 40 mg by mouth at bedtime. 02/12/20   [provider]  Continuous Blood Gluc Sensor (FREESTYLE LIBRE 14 DAY SENSOR) MISC Apply 1 each topically every 14 (fourteen) days. 02/09/20   [provider]  famotidine (PEPCID) 20 MG tablet Take 1 tablet (20 mg total) by mouth daily. 03/23/20   Regalado, Belkys A, MD  Multiple Vitamin (MULTI-VITAMIN) tablet Take 1 tablet by mouth daily.    [provider]    Allergies    Patient has no known allergies.  Review of Systems   Review of Systems  Constitutional:  Negative for fatigue and fever.  HENT:  Negative for congestion, ear pain, rhinorrhea and sore throat.   Eyes:  Negative for redness and visual disturbance.  Respiratory:  Negative for cough, shortness of  breath and wheezing.   Cardiovascular:  Negative for chest pain.  Gastrointestinal:  Negative for abdominal pain, diarrhea, nausea and vomiting.  Genitourinary:  Negative for difficulty urinating.  Musculoskeletal:  Negative for myalgias.  Skin:  Negative for rash.  Neurological:  Negative for dizziness, loss of consciousness, syncope, facial asymmetry, speech difficulty, weakness, numbness and headaches.  Psychiatric/Behavioral:  Negative for agitation and confusion.   All other systems reviewed and are  negative.  Physical Exam Updated Vital Signs BP 119/76   Pulse 79   Temp 98.2 F (36.8 C) (Oral)   Resp 18   Wt 94.3 kg   LMP 05/07/2019   SpO2 99%   BMI 36.85 kg/m   Physical Exam Vitals and nursing note reviewed.  Constitutional:      General: She is not in acute distress.    Appearance: Normal appearance.  HENT:     Head: Normocephalic and atraumatic.     Nose: Nose normal.     Mouth/Throat:     Mouth: Mucous membranes are moist.     Pharynx: Oropharynx is clear.  Eyes:     Extraocular Movements: Extraocular movements intact.     Conjunctiva/sclera: Conjunctivae normal.     Pupils: Pupils are equal, round, and reactive to light.  Cardiovascular:     Rate and Rhythm: Normal rate and regular rhythm.     Pulses: Normal pulses.     Heart sounds: Normal heart sounds.  Pulmonary:     Effort: Pulmonary effort is normal.     Breath sounds: Normal breath sounds.  Abdominal:     General: Abdomen is flat.     Palpations: Abdomen is soft.     Tenderness: There is no abdominal tenderness. There is no guarding.  Musculoskeletal:        General: Normal range of motion.  Skin:    General: Skin is warm and dry.  Neurological:     General: No focal deficit present.     Mental Status: She is alert and oriented to person, place, and time.     Cranial Nerves: No cranial nerve deficit.     Sensory: No sensory deficit.     Motor: No weakness.     Deep Tendon Reflexes: Reflexes normal.  Psychiatric:        Mood and Affect: Mood normal.        Behavior: Behavior normal.    ED Results / Procedures / Treatments   Labs (all labs ordered are listed, but only abnormal results are displayed) Results for orders placed or performed during the hospital encounter of 10/10/20  Basic metabolic panel  Result Value Ref Range   Sodium 135 135 - 145 mmol/L   Potassium 4.8 3.5 - 5.1 mmol/L   Chloride 103 98 - 111 mmol/L   CO2 26 22 - 32 mmol/L   Glucose, Bld 93 70 - 99 mg/dL   BUN 19 6 -  20 mg/dL   Creatinine, Ser 1.61 0.44 - 1.00 mg/dL   Calcium 8.9 8.9 - 09.6 mg/dL   GFR, Estimated >04 >54 mL/min   Anion gap 6 5 - 15  Pregnancy, urine  Result Value Ref Range   Preg Test, Ur NEGATIVE NEGATIVE  CBC with Differential/Platelet  Result Value Ref Range   WBC 5.4 4.0 - 10.5 K/uL   RBC 4.16 3.87 - 5.11 MIL/uL   Hemoglobin 11.5 (L) 12.0 - 15.0 g/dL   HCT 09.8 (L) 11.9 - 14.7 %   MCV 83.9 80.0 -  100.0 fL   MCH 27.6 26.0 - 34.0 pg   MCHC 33.0 30.0 - 36.0 g/dL   RDW 64.4 03.4 - 74.2 %   Platelets 205 150 - 400 K/uL   nRBC 0.0 0.0 - 0.2 %   Neutrophils Relative % 47 %   Neutro Abs 2.7 1.7 - 7.7 K/uL   Lymphocytes Relative 34 %   Lymphs Abs 1.8 0.7 - 4.0 K/uL   Monocytes Relative 9 %   Monocytes Absolute 0.5 0.1 - 1.0 K/uL   Eosinophils Relative 9 %   Eosinophils Absolute 0.5 0.0 - 0.5 K/uL   Basophils Relative 1 %   Basophils Absolute 0.0 0.0 - 0.1 K/uL   Immature Granulocytes 0 %   Abs Immature Granulocytes 0.01 0.00 - 0.07 K/uL   CT Angio Head W or Wo Contrast  Result Date: 10/11/2020 CLINICAL DATA:  Left eyelid spasm.  Slurred speech. EXAM: CT ANGIOGRAPHY HEAD AND NECK TECHNIQUE: Multidetector CT imaging of the head and neck was performed using the standard protocol during bolus administration of intravenous contrast. Multiplanar CT image reconstructions and MIPs were obtained to evaluate the vascular anatomy. Carotid stenosis measurements (when applicable) are obtained utilizing NASCET criteria, using the distal internal carotid diameter as the denominator. CONTRAST:  OMNIPAQUE IOHEXOL 350 MG/ML SOLN COMPARISON:  None. FINDINGS: CT HEAD FINDINGS Brain: There is no mass, hemorrhage or extra-axial collection. The size and configuration of the ventricles and extra-axial CSF spaces are normal. There is no acute or chronic infarction. The brain parenchyma is normal. Skull: The visualized skull base, calvarium and extracranial soft tissues are normal. Sinuses/Orbits: No  fluid levels or advanced mucosal thickening of the visualized paranasal sinuses. No mastoid or middle ear effusion. The orbits are normal. CTA NECK FINDINGS SKELETON: There is no bony spinal canal stenosis. No lytic or blastic lesion. OTHER NECK: Normal pharynx, larynx and major salivary glands. No cervical lymphadenopathy. Unremarkable thyroid gland. UPPER CHEST: No pneumothorax or pleural effusion. No nodules or masses. AORTIC ARCH: There is calcific atherosclerosis of the aortic arch. There is no aneurysm, dissection or hemodynamically significant stenosis of the visualized portion of the aorta. Conventional 3 vessel aortic branching pattern. The visualized proximal subclavian arteries are widely patent. RIGHT CAROTID SYSTEM: Normal without aneurysm, dissection or stenosis. LEFT CAROTID SYSTEM: Normal without aneurysm, dissection or stenosis. VERTEBRAL ARTERIES: Left dominant configuration. Both origins are clearly patent. There is no dissection, occlusion or flow-limiting stenosis to the skull base (V1-V3 segments). CTA HEAD FINDINGS POSTERIOR CIRCULATION: --Vertebral arteries: Normal V4 segments. --Inferior cerebellar arteries: Normal. --Basilar artery: Normal. --Superior cerebellar arteries: Normal. --Posterior cerebral arteries (PCA): Normal. ANTERIOR CIRCULATION: --Intracranial internal carotid arteries: Normal. --Anterior cerebral arteries (ACA): Normal. Both A1 segments are present. Patent anterior communicating artery (a-comm). --Middle cerebral arteries (MCA): Normal. VENOUS SINUSES: As permitted by contrast timing, patent. ANATOMIC VARIANTS: None Review of the MIP images confirms the above findings. IMPRESSION: Normal CTA of the head and neck. Electronically Signed   By: Deatra Robinson M.D.   On: 10/11/2020 02:09   CT Angio Neck W and/or Wo Contrast  Result Date: 10/11/2020 CLINICAL DATA:  Left eyelid spasm.  Slurred speech. EXAM: CT ANGIOGRAPHY HEAD AND NECK TECHNIQUE: Multidetector CT imaging of  the head and neck was performed using the standard protocol during bolus administration of intravenous contrast. Multiplanar CT image reconstructions and MIPs were obtained to evaluate the vascular anatomy. Carotid stenosis measurements (when applicable) are obtained utilizing NASCET criteria, using the distal internal carotid diameter as the  denominator. CONTRAST:  OMNIPAQUE IOHEXOL 350 MG/ML SOLN COMPARISON:  None. FINDINGS: CT HEAD FINDINGS Brain: There is no mass, hemorrhage or extra-axial collection. The size and configuration of the ventricles and extra-axial CSF spaces are normal. There is no acute or chronic infarction. The brain parenchyma is normal. Skull: The visualized skull base, calvarium and extracranial soft tissues are normal. Sinuses/Orbits: No fluid levels or advanced mucosal thickening of the visualized paranasal sinuses. No mastoid or middle ear effusion. The orbits are normal. CTA NECK FINDINGS SKELETON: There is no bony spinal canal stenosis. No lytic or blastic lesion. OTHER NECK: Normal pharynx, larynx and major salivary glands. No cervical lymphadenopathy. Unremarkable thyroid gland. UPPER CHEST: No pneumothorax or pleural effusion. No nodules or masses. AORTIC ARCH: There is calcific atherosclerosis of the aortic arch. There is no aneurysm, dissection or hemodynamically significant stenosis of the visualized portion of the aorta. Conventional 3 vessel aortic branching pattern. The visualized proximal subclavian arteries are widely patent. RIGHT CAROTID SYSTEM: Normal without aneurysm, dissection or stenosis. LEFT CAROTID SYSTEM: Normal without aneurysm, dissection or stenosis. VERTEBRAL ARTERIES: Left dominant configuration. Both origins are clearly patent. There is no dissection, occlusion or flow-limiting stenosis to the skull base (V1-V3 segments). CTA HEAD FINDINGS POSTERIOR CIRCULATION: --Vertebral arteries: Normal V4 segments. --Inferior cerebellar arteries: Normal. --Basilar  artery: Normal. --Superior cerebellar arteries: Normal. --Posterior cerebral arteries (PCA): Normal. ANTERIOR CIRCULATION: --Intracranial internal carotid arteries: Normal. --Anterior cerebral arteries (ACA): Normal. Both A1 segments are present. Patent anterior communicating artery (a-comm). --Middle cerebral arteries (MCA): Normal. VENOUS SINUSES: As permitted by contrast timing, patent. ANATOMIC VARIANTS: None Review of the MIP images confirms the above findings. IMPRESSION: Normal CTA of the head and neck. Electronically Signed   By: Deatra Robinson M.D.   On: 10/11/2020 02:09    Radiology CT Angio Head W or Wo Contrast  Result Date: 10/11/2020 CLINICAL DATA:  Left eyelid spasm.  Slurred speech. EXAM: CT ANGIOGRAPHY HEAD AND NECK TECHNIQUE: Multidetector CT imaging of the head and neck was performed using the standard protocol during bolus administration of intravenous contrast. Multiplanar CT image reconstructions and MIPs were obtained to evaluate the vascular anatomy. Carotid stenosis measurements (when applicable) are obtained utilizing NASCET criteria, using the distal internal carotid diameter as the denominator. CONTRAST:  OMNIPAQUE IOHEXOL 350 MG/ML SOLN COMPARISON:  None. FINDINGS: CT HEAD FINDINGS Brain: There is no mass, hemorrhage or extra-axial collection. The size and configuration of the ventricles and extra-axial CSF spaces are normal. There is no acute or chronic infarction. The brain parenchyma is normal. Skull: The visualized skull base, calvarium and extracranial soft tissues are normal. Sinuses/Orbits: No fluid levels or advanced mucosal thickening of the visualized paranasal sinuses. No mastoid or middle ear effusion. The orbits are normal. CTA NECK FINDINGS SKELETON: There is no bony spinal canal stenosis. No lytic or blastic lesion. OTHER NECK: Normal pharynx, larynx and major salivary glands. No cervical lymphadenopathy. Unremarkable thyroid gland. UPPER CHEST: No pneumothorax  or pleural effusion. No nodules or masses. AORTIC ARCH: There is calcific atherosclerosis of the aortic arch. There is no aneurysm, dissection or hemodynamically significant stenosis of the visualized portion of the aorta. Conventional 3 vessel aortic branching pattern. The visualized proximal subclavian arteries are widely patent. RIGHT CAROTID SYSTEM: Normal without aneurysm, dissection or stenosis. LEFT CAROTID SYSTEM: Normal without aneurysm, dissection or stenosis. VERTEBRAL ARTERIES: Left dominant configuration. Both origins are clearly patent. There is no dissection, occlusion or flow-limiting stenosis to the skull base (V1-V3 segments). CTA HEAD FINDINGS  POSTERIOR CIRCULATION: --Vertebral arteries: Normal V4 segments. --Inferior cerebellar arteries: Normal. --Basilar artery: Normal. --Superior cerebellar arteries: Normal. --Posterior cerebral arteries (PCA): Normal. ANTERIOR CIRCULATION: --Intracranial internal carotid arteries: Normal. --Anterior cerebral arteries (ACA): Normal. Both A1 segments are present. Patent anterior communicating artery (a-comm). --Middle cerebral arteries (MCA): Normal. VENOUS SINUSES: As permitted by contrast timing, patent. ANATOMIC VARIANTS: None Review of the MIP images confirms the above findings. IMPRESSION: Normal CTA of the head and neck. Electronically Signed   By: Deatra Robinson M.D.   On: 10/11/2020 02:09   CT Angio Neck W and/or Wo Contrast  Result Date: 10/11/2020 CLINICAL DATA:  Left eyelid spasm.  Slurred speech. EXAM: CT ANGIOGRAPHY HEAD AND NECK TECHNIQUE: Multidetector CT imaging of the head and neck was performed using the standard protocol during bolus administration of intravenous contrast. Multiplanar CT image reconstructions and MIPs were obtained to evaluate the vascular anatomy. Carotid stenosis measurements (when applicable) are obtained utilizing NASCET criteria, using the distal internal carotid diameter as the denominator. CONTRAST:  OMNIPAQUE  IOHEXOL 350 MG/ML SOLN COMPARISON:  None. FINDINGS: CT HEAD FINDINGS Brain: There is no mass, hemorrhage or extra-axial collection. The size and configuration of the ventricles and extra-axial CSF spaces are normal. There is no acute or chronic infarction. The brain parenchyma is normal. Skull: The visualized skull base, calvarium and extracranial soft tissues are normal. Sinuses/Orbits: No fluid levels or advanced mucosal thickening of the visualized paranasal sinuses. No mastoid or middle ear effusion. The orbits are normal. CTA NECK FINDINGS SKELETON: There is no bony spinal canal stenosis. No lytic or blastic lesion. OTHER NECK: Normal pharynx, larynx and major salivary glands. No cervical lymphadenopathy. Unremarkable thyroid gland. UPPER CHEST: No pneumothorax or pleural effusion. No nodules or masses. AORTIC ARCH: There is calcific atherosclerosis of the aortic arch. There is no aneurysm, dissection or hemodynamically significant stenosis of the visualized portion of the aorta. Conventional 3 vessel aortic branching pattern. The visualized proximal subclavian arteries are widely patent. RIGHT CAROTID SYSTEM: Normal without aneurysm, dissection or stenosis. LEFT CAROTID SYSTEM: Normal without aneurysm, dissection or stenosis. VERTEBRAL ARTERIES: Left dominant configuration. Both origins are clearly patent. There is no dissection, occlusion or flow-limiting stenosis to the skull base (V1-V3 segments). CTA HEAD FINDINGS POSTERIOR CIRCULATION: --Vertebral arteries: Normal V4 segments. --Inferior cerebellar arteries: Normal. --Basilar artery: Normal. --Superior cerebellar arteries: Normal. --Posterior cerebral arteries (PCA): Normal. ANTERIOR CIRCULATION: --Intracranial internal carotid arteries: Normal. --Anterior cerebral arteries (ACA): Normal. Both A1 segments are present. Patent anterior communicating artery (a-comm). --Middle cerebral arteries (MCA): Normal. VENOUS SINUSES: As permitted by contrast timing,  patent. ANATOMIC VARIANTS: None Review of the MIP images confirms the above findings. IMPRESSION: Normal CTA of the head and neck. Electronically Signed   By: Deatra Robinson M.D.   On: 10/11/2020 02:09    Procedures Procedures   Medications Ordered in ED Medications  iohexol (OMNIPAQUE) 350 MG/ML injection 100 mL (100 mLs Intravenous Contrast Given 10/11/20 0151)    ED Course  I have reviewed the triage vital signs and the nursing notes.  Pertinent labs & imaging results that were available during my care of the patient were reviewed by me and considered in my medical decision making (see chart for details).   3 am case d/w Dr. Derry Lory of neurology, he has reviewed labs and imaging.  He does not believe this is a stroke or TIA. Does not need MRI at this time.  It is not a partial seizure given it is bilateral.  Give patient  strict stroke return precautions and refer to neurology as an outpatient.    EDP spoke to patient and explained at length to return immediately to the closest ER for weakness of one or more extremities, numbness, changes in vision or speech, unsteadiness on her feet, loss of consciousness, seizure like activity or any concerns and to please call neurology for an outpatient follow up appointment.  Patient verbalizes understanding of return precautions and need for close follow up and agrees to follow up as an outpatient.    Katherine Cabrera was evaluated in Emergency Department on 10/11/2020 for the symptoms described in the history of present illness. She was evaluated in the context of the global COVID-19 pandemic, which necessitated consideration that the patient might be at risk for infection with the SARS-CoV-2 virus that causes COVID-19. Institutional protocols and algorithms that pertain to the evaluation of patients at risk for COVID-19 are in a state of rapid change based on information released by regulatory bodies including the CDC and federal and state organizations.  These policies and algorithms were followed during the patient's care in the ED.  Final Clinical Impression(s) / ED Diagnoses Final diagnoses:  None   Return for intractable cough, coughing up blood, fevers > 100.4 unrelieved by medication, shortness of breath, intractable vomiting, chest pain, shortness of breath, weakness, numbness, changes in speech, facial asymmetry, abdominal pain, passing out, Inability to tolerate liquids or food, cough, altered mental status or any concerns. No signs of systemic illness or infection. The patient is nontoxic-appearing on exam and vital signs are within normal limits. I have reviewed the triage vital signs and the nursing notes. Pertinent labs & imaging results that were available during my care of the patient were reviewed by me and considered in my medical decision making (see chart for details). After history, exam, and medical workup I feel the patient has been appropriately medically screened and is safe for discharge home. Pertinent diagnoses were discussed with the patient. Patient was given return precautions. Rx / DC Orders ED Discharge Orders     None        Laurelyn Terrero, MD 10/11/20 [phone]

## 2020-10-11 NOTE — ED Notes (Signed)
This nurse entered room to start iv, patient not in room. Unable to locate patient at this time.

## 2021-11-18 ENCOUNTER — Emergency Department (HOSPITAL_COMMUNITY): Payer: BC Managed Care – PPO

## 2021-11-18 ENCOUNTER — Encounter (HOSPITAL_COMMUNITY): Payer: Self-pay | Admitting: Emergency Medicine

## 2021-11-18 ENCOUNTER — Emergency Department (HOSPITAL_COMMUNITY)
Admission: EM | Admit: 2021-11-18 | Discharge: 2021-11-19 | Discharge status: Against Medical Advice | Payer: BC Managed Care – PPO | Attending: Emergency Medicine | Admitting: Emergency Medicine

## 2021-11-18 DIAGNOSIS — Z5321 Procedure and treatment not carried out due to patient leaving prior to being seen by health care provider: Secondary | ICD-10-CM | POA: Insufficient documentation

## 2021-11-18 DIAGNOSIS — R079 Chest pain, unspecified: Secondary | ICD-10-CM | POA: Diagnosis not present

## 2021-11-18 DIAGNOSIS — R55 Syncope and collapse: Secondary | ICD-10-CM | POA: Diagnosis not present

## 2021-11-18 DIAGNOSIS — R42 Dizziness and giddiness: Secondary | ICD-10-CM | POA: Insufficient documentation

## 2021-11-18 DIAGNOSIS — R1030 Lower abdominal pain, unspecified: Secondary | ICD-10-CM | POA: Insufficient documentation

## 2021-11-18 LAB — URINALYSIS, ROUTINE W REFLEX MICROSCOPIC
Bilirubin Urine: NEGATIVE
Glucose, UA: NEGATIVE mg/dL
Hgb urine dipstick: NEGATIVE
Ketones, ur: NEGATIVE mg/dL
Leukocytes,Ua: NEGATIVE
Nitrite: NEGATIVE
Protein, ur: NEGATIVE mg/dL
Specific Gravity, Urine: 1.025 (ref 1.005–1.030)
pH: 7 (ref 5.0–8.0)

## 2021-11-18 LAB — BASIC METABOLIC PANEL
Anion gap: 11 (ref 5–15)
BUN: 20 mg/dL (ref 6–20)
CO2: 23 mmol/L (ref 22–32)
Calcium: 9.5 mg/dL (ref 8.9–10.3)
Chloride: 106 mmol/L (ref 98–111)
Creatinine, Ser: 0.9 mg/dL (ref 0.44–1.00)
GFR, Estimated: >60 mL/min (ref >60)
Glucose, Bld: 101 mg/dL — ABNORMAL HIGH (ref 70–99)
Potassium: 4.2 mmol/L (ref 3.5–5.1)
Sodium: 140 mmol/L (ref 135–145)

## 2021-11-18 LAB — I-STAT BETA HCG BLOOD, ED (MC, WL, AP ONLY): I-stat hCG, quantitative: <5 m[IU]/mL (ref <5)

## 2021-11-18 LAB — CBC
HCT: 41.6 % (ref 36.0–46.0)
Hemoglobin: 13.8 g/dL (ref 12.0–15.0)
MCH: 28.3 pg (ref 26.0–34.0)
MCHC: 33.2 g/dL (ref 30.0–36.0)
MCV: 85.4 fL (ref 80.0–100.0)
Platelets: 218 10*3/uL (ref 150–400)
RBC: 4.87 MIL/uL (ref 3.87–5.11)
RDW: 12.9 % (ref 11.5–15.5)
WBC: 4.9 10*3/uL (ref 4.0–10.5)
nRBC: 0 % (ref 0.0–0.2)

## 2021-11-18 LAB — TROPONIN I (HIGH SENSITIVITY): Troponin I (High Sensitivity): 3 ng/L (ref <18)

## 2021-11-18 LAB — LIPASE, BLOOD: Lipase: 30 U/L (ref 11–51)

## 2021-11-18 NOTE — ED Triage Notes (Addendum)
Patient from home BIB [facility] with a recent episode of near syncope. Patient was feeling light headed while walking. Patient complaining of bilateral lower abdominal pain. Patient reported chest pain, centrally describing it has a dull ache enroute and give 324 ASA.   VSS: BP- 120/78 HR 90  EKG unremarkable 98% RA

## 2021-11-19 NOTE — ED Notes (Signed)
Pt and husband came up to this tech and stated she is now leaving.

## 2022-09-25 ENCOUNTER — Encounter (HOSPITAL_BASED_OUTPATIENT_CLINIC_OR_DEPARTMENT_OTHER): Payer: Self-pay

## 2022-09-25 ENCOUNTER — Emergency Department (HOSPITAL_BASED_OUTPATIENT_CLINIC_OR_DEPARTMENT_OTHER)
Admission: EM | Admit: 2022-09-25 | Discharge: 2022-09-25 | Disposition: A | Discharge status: Home / Self Care | Payer: BC Managed Care – PPO | Source: Home / Self Care | Attending: Emergency Medicine | Admitting: Emergency Medicine

## 2022-09-25 ENCOUNTER — Other Ambulatory Visit: Payer: Self-pay

## 2022-09-25 DIAGNOSIS — I10 Essential (primary) hypertension: Secondary | ICD-10-CM | POA: Insufficient documentation

## 2022-09-25 DIAGNOSIS — T7840XA Allergy, unspecified, initial encounter: Secondary | ICD-10-CM | POA: Insufficient documentation

## 2022-09-25 DIAGNOSIS — E119 Type 2 diabetes mellitus without complications: Secondary | ICD-10-CM | POA: Diagnosis not present

## 2022-09-25 DIAGNOSIS — R21 Rash and other nonspecific skin eruption: Secondary | ICD-10-CM | POA: Diagnosis present

## 2022-09-25 MED ORDER — FAMOTIDINE IN NACL 20-0.9 MG/50ML-% IV SOLN
20.0000 mg | Freq: Once | INTRAVENOUS | Status: AC
  Administered 2022-09-25: 20 mg via INTRAVENOUS
  Filled 2022-09-25: qty 50

## 2022-09-25 MED ORDER — EPINEPHRINE 0.3 MG/0.3ML IJ SOAJ: 0.3000 mg | INTRAMUSCULAR | 0 refills | Status: AC | PRN

## 2022-09-25 MED ORDER — DIPHENHYDRAMINE HCL 50 MG/ML IJ SOLN
50.0000 mg | Freq: Once | INTRAMUSCULAR | Status: AC
  Administered 2022-09-25: 50 mg via INTRAVENOUS
  Filled 2022-09-25: qty 1

## 2022-09-25 MED ORDER — CETIRIZINE HCL 10 MG PO TABS: 10.0000 mg | ORAL_TABLET | Freq: Every day | ORAL | 0 refills | Status: AC | PRN

## 2022-09-25 MED ORDER — SODIUM CHLORIDE 0.9 % IV BOLUS
500.0000 mL | Freq: Once | INTRAVENOUS | Status: AC
  Administered 2022-09-25: 500 mL via INTRAVENOUS

## 2022-09-25 MED ORDER — PREDNISONE 20 MG PO TABS: 40.0000 mg | ORAL_TABLET | Freq: Every day | ORAL | 0 refills | Status: AC

## 2022-09-25 MED ORDER — METHYLPREDNISOLONE SODIUM SUCC 125 MG IJ SOLR
125.0000 mg | Freq: Once | INTRAMUSCULAR | Status: AC
  Administered 2022-09-25: 125 mg via INTRAVENOUS
  Filled 2022-09-25: qty 2

## 2022-09-25 NOTE — ED Triage Notes (Signed)
Patient started terbinafine hcl two weeks ago. Wednesday she stated having hives. She was seen at [facility] Thursday. She was given prednisone and decadron. Today she is woke up and its all over her body.

## 2022-09-25 NOTE — Discharge Instructions (Addendum)
As discussed, workup today overall reassuring.  Will recommend continued use of allergy medicine such as Zyrtec/Claritin/Allegra in the morning and using Benadryl at night during duration of prednisone course.  Recommend continued prednisone course for the next 5 to 6 days.  Will also send home EpiPen to take as needed if concerns for anaphylaxis.  Recommend follow-up with primary care for reassessment of your symptoms.  Please do not hesitate to return to emergency department if the worrisome signs and symptoms we discussed become apparent.

## 2022-09-25 NOTE — ED Notes (Addendum)
Noted in [facility], denies SOB but having 2 weeks of itching and burning to skin . Started an anti-fungal med x 2 weeks ago

## 2022-09-25 NOTE — ED Notes (Signed)
Itching has decreased per patient.

## 2022-09-25 NOTE — ED Provider Notes (Signed)
[place] [facility] Provider Note   CSN: [id number] Arrival date & time: 09/25/22  5284     History  Chief Complaint  Patient presents with   Urticaria    Katherine Cabrera is a 47 y.o. female.   Urticaria   47 year old female presents emergency department complaints of itchy rash.  Patient states that she woke up this morning with appearance of rash.  States that she had history of similar symptoms this past Wednesday where she developed itchy rash that responded to Benadryl.  States that she subsequently woke up on Thursday and was seen in urgent care and given a shot of Decadron and sent home with prednisone.  She states that she has been compliant with her prednisone but has not continue to take antihistamines.  Has taken no medication since symptoms began this morning.  Denies any known exposure to allergen or recent changes in fragrances, detergents, soaps, lotions.  Denies any difficulty breathing, feelings of mouth/tongue swelling, nausea, vomiting.  States the rashes on her arms, torso, legs.  States his only recent medication changes addition of terbinafine for treatment of "fungal infection of toenails and fingernails."  Past medical history significant for diabetes mellitus, hypertension, de Quervain's  Home Medications Prior to Admission medications   Medication Sig Start Date End Date Taking? Authorizing Provider  cetirizine (ZYRTEC ALLERGY) 10 MG tablet Take 1 tablet (10 mg total) by mouth daily as needed for allergies (itch). 09/25/22  Yes Sherian Maroon A, PA  EPINEPHrine 0.3 mg/0.3 mL IJ SOAJ injection Inject 0.3 mg into the muscle as needed for anaphylaxis. 09/25/22  Yes Sherian Maroon A, PA  predniSONE (DELTASONE) 20 MG tablet Take 2 tablets (40 mg total) by mouth daily with breakfast for 5 days. 09/26/22 10/01/22 Yes Sherian Maroon A, PA  acetaminophen (TYLENOL) 325 MG tablet Take 2 tablets (650 mg total) by mouth every 6 (six) hours as  needed for mild pain (or Fever >/= 101). 03/23/20   Regalado, Belkys A, MD  atorvastatin (LIPITOR) 40 MG tablet Take 40 mg by mouth at bedtime. 02/12/20   [provider]  Continuous Blood Gluc Sensor (FREESTYLE LIBRE 14 DAY SENSOR) MISC Apply 1 each topically every 14 (fourteen) days. 02/09/20   [provider]  famotidine (PEPCID) 20 MG tablet Take 1 tablet (20 mg total) by mouth daily. 03/23/20   Regalado, Belkys A, MD  Multiple Vitamin (MULTI-VITAMIN) tablet Take 1 tablet by mouth daily.    [provider]      Allergies    Other    Review of Systems   Review of Systems  All other systems reviewed and are negative.   Physical Exam Updated Vital Signs BP (!) 144/94   Pulse 75   Temp 97.7 F (36.5 C) (Oral)   Resp 18   Ht 5\' 3"  (1.6 m)   Wt 94.3 kg   LMP 05/07/2019   SpO2 100%   BMI 36.83 kg/m  Physical Exam Vitals and nursing note reviewed.  Constitutional:      General: She is not in acute distress.    Appearance: She is well-developed.  HENT:     Head: Normocephalic and atraumatic.  Eyes:     Conjunctiva/sclera: Conjunctivae normal.  Cardiovascular:     Rate and Rhythm: Normal rate and regular rhythm.     Heart sounds: No murmur heard. Pulmonary:     Effort: Pulmonary effort is normal. No respiratory distress.     Breath sounds: Normal  breath sounds. No stridor. No wheezing, rhonchi or rales.  Abdominal:     Palpations: Abdomen is soft.     Tenderness: There is no abdominal tenderness.  Musculoskeletal:        General: No swelling.     Cervical back: Neck supple.  Skin:    General: Skin is warm and dry.     Capillary Refill: Capillary refill takes less than 2 seconds.     Comments: Patient with raised wheal-like rash over upper and lower extremities, chest, abdomen.  Excoriations present.  No facial involvement.  Neurological:     Mental Status: She is alert.  Psychiatric:        Mood and Affect: Mood normal.     ED Results /  Procedures / Treatments   Labs (all labs ordered are listed, but only abnormal results are displayed) Labs Reviewed - No data to display  EKG None  Radiology No results found.  Procedures Procedures    Medications Ordered in ED Medications  diphenhydrAMINE (BENADRYL) injection 50 mg (50 mg Intravenous Given 09/25/22 1046)  famotidine (PEPCID) IVPB 20 mg premix (0 mg Intravenous Stopped 09/25/22 1151)  methylPREDNISolone sodium succinate (SOLU-MEDROL) 125 mg/2 mL injection 125 mg (125 mg Intravenous Given 09/25/22 1046)  sodium chloride 0.9 % bolus 500 mL (0 mLs Intravenous Stopped 09/25/22 1151)    ED Course/ Medical Decision Making/ A&P                                 Medical Decision Making Risk OTC drugs. Prescription drug management.   This patient presents to the ED for concern of rash, this involves an extensive number of treatment options, and is a complaint that carries with it a high risk of complications and morbidity.  The differential diagnosis includes urticaria, anaphylaxis, angioedema, contact dermatitis, SJS/TEN, viral exanthem   Co morbidities that complicate the patient evaluation  See HPI   Additional history obtained:  Additional history obtained from EMR External records from outside source obtained and reviewed including hospital records   Lab Tests:  N/a   Imaging Studies ordered:  N/a   Cardiac Monitoring: / EKG:  The patient was maintained on a cardiac monitor.  I personally viewed and interpreted the cardiac monitored which showed an underlying rhythm of: Sinus rhythm   Consultations Obtained:  N/a   Problem List / ED Course / Critical interventions / Medication management  Rash, urticaria I ordered medication including Pepcid, Benadryl, normal saline, Solu-Medrol  Reevaluation of the patient after these medicines showed that the patient improved I have reviewed the patients home medicines and have made adjustments as  needed   Social Determinants of Health:  Denies tobacco, licit drug use   Test / Admission - Considered:  Rash, urticaria Vitals signs significant for mild hypertension with blood pressure 144/94. Otherwise within normal range and stable throughout visit. 47 year old female presents emergency department with complaints of rash.  Rash present Wednesday of this past week with response to antihistamine as well as prednisone as prescribed by urgent care.  Recurrence occur this morning.  Rash most consistent with urticaria without clinical evidence of anaphylaxis/angioedema.  Patient treated with antihistamine, Solu-Medrol and noted complete resolution of rash and pruritic symptoms.  Unsure of exact cause of patient's rash given difficulty to tease out from patient's history.  Will recommend continued prednisone in the outpatient setting as well as antihistamines as patient has not been taking  any antihistamines since the day the rash appeared.  Will recommend follow-up with primary care within the next 1 to 2 days for reassessment.  Treatment plan discussed at length with patient and she acknowledged understand was agreeable to said plan.  Patient observed for 3+ hours after symptom improvement with medications with no recurrence.  Patient overall well-appearing, afebrile in no acute distress and no longer symptomatic. Worrisome signs and symptoms were discussed with the patient, and the patient acknowledged understanding to return to the ED if noticed. Patient was stable upon discharge.          Final Clinical Impression(s) / ED Diagnoses Final diagnoses:  Allergic reaction, initial encounter    Rx / DC Orders ED Discharge Orders          Ordered    predniSONE (DELTASONE) 20 MG tablet  Daily with breakfast        09/25/22 1159    cetirizine (ZYRTEC ALLERGY) 10 MG tablet  Daily PRN        09/25/22 1159    EPINEPHrine 0.3 mg/0.3 mL IJ SOAJ injection  As needed        09/25/22 1159               Peter Garter, [place] 09/25/22 1307    Alvira Monday, MD 09/25/22 2243

## 2023-06-18 IMAGING — CT CT ANGIO HEAD
1 of 11 series · 6 of 33 positions shown · IV contrast (Omnipaque)
Comparison: None.

CLINICAL DATA: Left eyelid spasm.  Slurred speech.

EXAM:
CT ANGIOGRAPHY HEAD AND NECK
TECHNIQUE: Multidetector CT imaging of the head and neck was performed using
the standard protocol during bolus administration of intravenous
contrast. Multiplanar CT image reconstructions and MIPs were
obtained to evaluate the vascular anatomy. Carotid stenosis
measurements (when applicable) are obtained utilizing NASCET
criteria, using the distal internal carotid diameter as the
denominator.
CONTRAST:  100mL OMNIPAQUE IOHEXOL 350 MG/ML SOLN

[Series 11: axial thin · axial · 0.39mm/px · z∈[+555,+802]mm · 6 of 347 slices shown]
[im 50/347  soft-tissue]
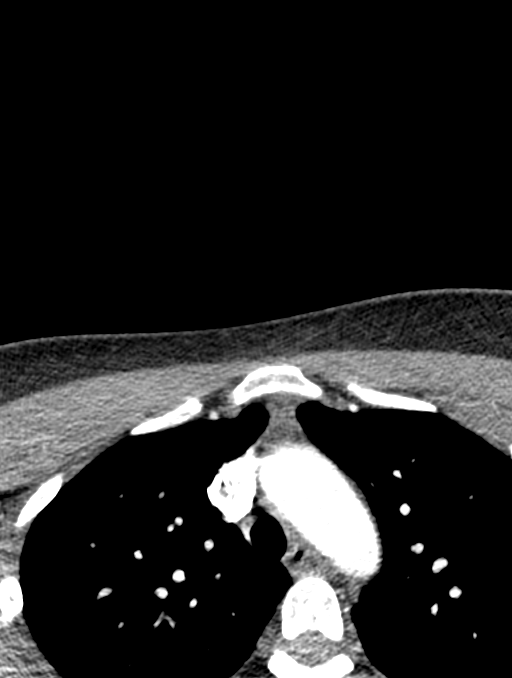
[im 99/347  bone]
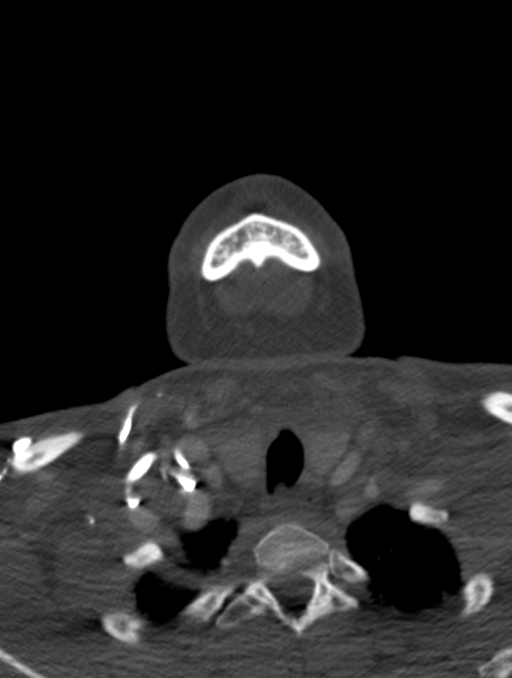
[im 149/347  soft-tissue]
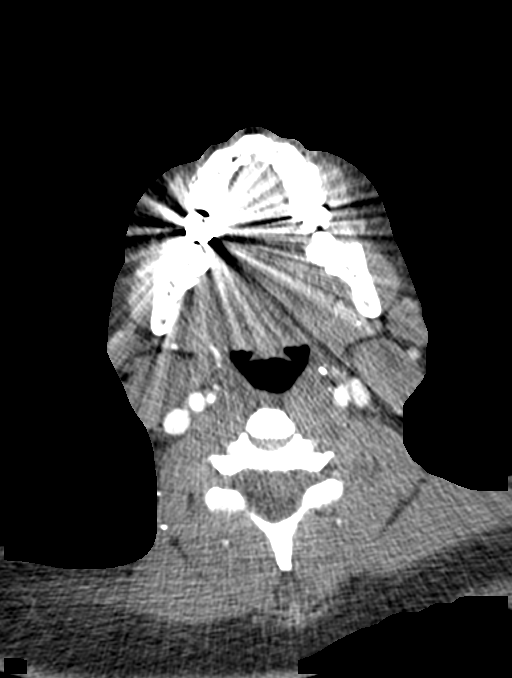
[im 198/347  bone]
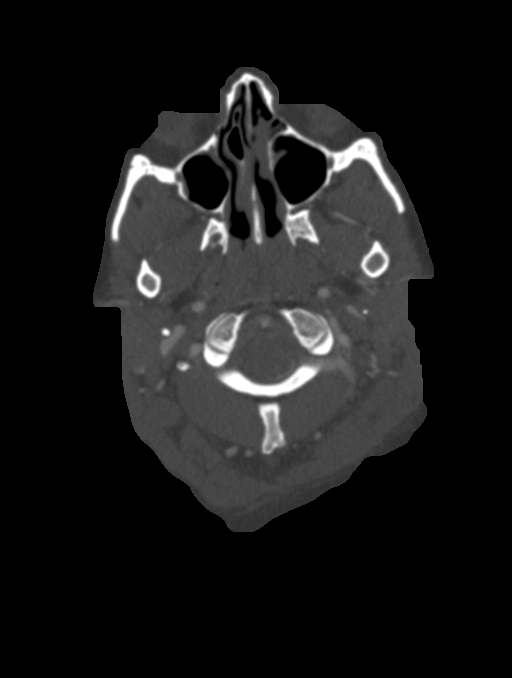
[im 248/347  soft-tissue]
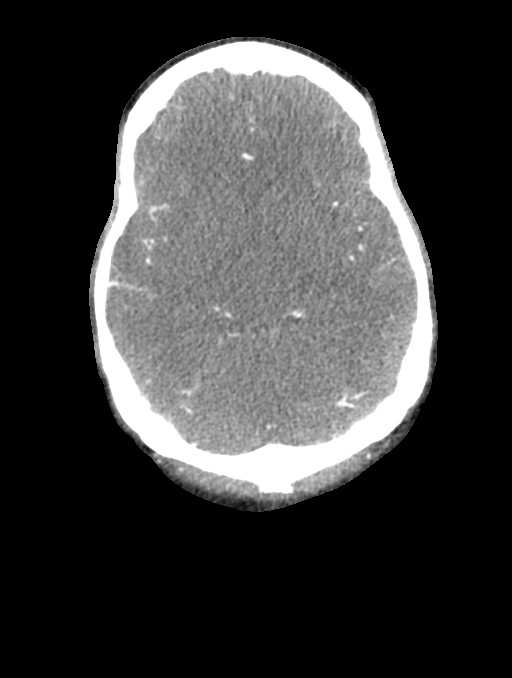
[im 297/347  bone]
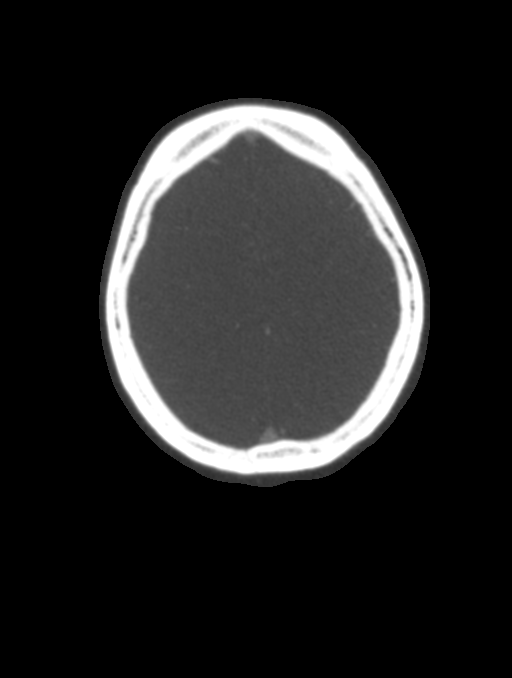

[6 of 33 positions shown; findings below may reference images not displayed]

FINDINGS: CT HEAD FINDINGS

Brain: There is no mass, hemorrhage or extra-axial collection. The
size and configuration of the ventricles and extra-axial CSF spaces
are normal. There is no acute or chronic infarction. The brain
parenchyma is normal.

Skull: The visualized skull base, calvarium and extracranial soft
tissues are normal.

Sinuses/Orbits: No fluid levels or advanced mucosal thickening of
the visualized paranasal sinuses. No mastoid or middle ear effusion.
The orbits are normal.

CTA NECK FINDINGS

SKELETON: There is no bony spinal canal stenosis. No lytic or
blastic lesion.

OTHER NECK: Normal pharynx, larynx and major salivary glands. No
cervical lymphadenopathy. Unremarkable thyroid gland.

UPPER CHEST: No pneumothorax or pleural effusion. No nodules or
masses.

AORTIC ARCH:

There is calcific atherosclerosis of the aortic arch. There is no
aneurysm, dissection or hemodynamically significant stenosis of the
visualized portion of the aorta. Conventional 3 vessel aortic
branching pattern. The visualized proximal subclavian arteries are
widely patent.

RIGHT CAROTID SYSTEM: Normal without aneurysm, dissection or
stenosis.

LEFT CAROTID SYSTEM: Normal without aneurysm, dissection or
stenosis.

VERTEBRAL ARTERIES: Left dominant configuration. Both origins are
clearly patent. There is no dissection, occlusion or flow-limiting
stenosis to the skull base (V1-V3 segments).

CTA HEAD FINDINGS

POSTERIOR CIRCULATION:

--Vertebral arteries: Normal V4 segments.

--Inferior cerebellar arteries: Normal.

--Basilar artery: Normal.

--Superior cerebellar arteries: Normal.

--Posterior cerebral arteries (PCA): Normal.

ANTERIOR CIRCULATION:

--Intracranial internal carotid arteries: Normal.

--Anterior cerebral arteries (ACA): Normal. Both A1 segments are
present. Patent anterior communicating artery (a-comm).

--Middle cerebral arteries (MCA): Normal.

VENOUS SINUSES: As permitted by contrast timing, patent.

ANATOMIC VARIANTS: None

Review of the MIP images confirms the above findings.
IMPRESSION: Normal CTA of the head and neck.

## 2023-12-23 ENCOUNTER — Emergency Department (HOSPITAL_COMMUNITY): Admitting: Certified Registered Nurse Anesthetist

## 2023-12-23 ENCOUNTER — Encounter (HOSPITAL_COMMUNITY): Payer: Self-pay

## 2023-12-23 ENCOUNTER — Encounter (HOSPITAL_COMMUNITY): Admission: EM | Disposition: A | Discharge status: Home / Self Care | Payer: Self-pay | Source: Home / Self Care

## 2023-12-23 ENCOUNTER — Inpatient Hospital Stay (HOSPITAL_BASED_OUTPATIENT_CLINIC_OR_DEPARTMENT_OTHER)
Admission: EM | Admit: 2023-12-23 | Discharge: 2023-12-26 | DRG: 330 | Disposition: A | Discharge status: Home / Self Care | Attending: General Surgery | Admitting: General Surgery

## 2023-12-23 ENCOUNTER — Emergency Department (HOSPITAL_BASED_OUTPATIENT_CLINIC_OR_DEPARTMENT_OTHER)

## 2023-12-23 ENCOUNTER — Other Ambulatory Visit: Payer: Self-pay

## 2023-12-23 DIAGNOSIS — K562 Volvulus: Principal | ICD-10-CM | POA: Diagnosis present

## 2023-12-23 DIAGNOSIS — Z90710 Acquired absence of both cervix and uterus: Secondary | ICD-10-CM

## 2023-12-23 DIAGNOSIS — I10 Essential (primary) hypertension: Secondary | ICD-10-CM | POA: Diagnosis present

## 2023-12-23 DIAGNOSIS — L0291 Cutaneous abscess, unspecified: Secondary | ICD-10-CM | POA: Diagnosis not present

## 2023-12-23 DIAGNOSIS — K469 Unspecified abdominal hernia without obstruction or gangrene: Secondary | ICD-10-CM | POA: Diagnosis present

## 2023-12-23 DIAGNOSIS — Z79899 Other long term (current) drug therapy: Secondary | ICD-10-CM

## 2023-12-23 DIAGNOSIS — Z7984 Long term (current) use of oral hypoglycemic drugs: Secondary | ICD-10-CM

## 2023-12-23 DIAGNOSIS — E1169 Type 2 diabetes mellitus with other specified complication: Secondary | ICD-10-CM | POA: Diagnosis present

## 2023-12-23 DIAGNOSIS — K631 Perforation of intestine (nontraumatic): Secondary | ICD-10-CM

## 2023-12-23 DIAGNOSIS — K458 Other specified abdominal hernia without obstruction or gangrene: Secondary | ICD-10-CM

## 2023-12-23 DIAGNOSIS — Q438 Other specified congenital malformations of intestine: Secondary | ICD-10-CM

## 2023-12-23 DIAGNOSIS — E785 Hyperlipidemia, unspecified: Secondary | ICD-10-CM | POA: Diagnosis present

## 2023-12-23 HISTORY — PX: LAPAROTOMY: SHX154

## 2023-12-23 LAB — HEPATIC FUNCTION PANEL
ALT: 19 U/L (ref 0–44)
AST: 23 U/L (ref 15–41)
Albumin: 4.1 g/dL (ref 3.5–5.0)
Alkaline Phosphatase: 100 U/L (ref 38–126)
Bilirubin, Direct: 0.1 mg/dL (ref 0.0–0.2)
Indirect Bilirubin: 0.3 mg/dL (ref 0.3–0.9)
Total Bilirubin: 0.4 mg/dL (ref 0.0–1.2)
Total Protein: 6.8 g/dL (ref 6.5–8.1)

## 2023-12-23 LAB — CBC WITH DIFFERENTIAL/PLATELET
Abs Immature Granulocytes: 0.01 K/uL (ref 0.00–0.07)
Basophils Absolute: 0 K/uL (ref 0.0–0.1)
Basophils Relative: 0 %
Eosinophils Absolute: 0.5 K/uL (ref 0.0–0.5)
Eosinophils Relative: 7 %
HCT: 41.1 % (ref 36.0–46.0)
Hemoglobin: 13.5 g/dL (ref 12.0–15.0)
Immature Granulocytes: 0 %
Lymphocytes Relative: 49 %
Lymphs Abs: 3.3 K/uL (ref 0.7–4.0)
MCH: 27.8 pg (ref 26.0–34.0)
MCHC: 32.8 g/dL (ref 30.0–36.0)
MCV: 84.7 fL (ref 80.0–100.0)
Monocytes Absolute: 0.6 K/uL (ref 0.1–1.0)
Monocytes Relative: 10 %
Neutro Abs: 2.2 K/uL (ref 1.7–7.7)
Neutrophils Relative %: 34 %
Platelets: 234 K/uL (ref 150–400)
RBC: 4.85 MIL/uL (ref 3.87–5.11)
RDW: 12.7 % (ref 11.5–15.5)
WBC: 6.7 K/uL (ref 4.0–10.5)
nRBC: 0 % (ref 0.0–0.2)

## 2023-12-23 LAB — GLUCOSE, CAPILLARY
Glucose-Capillary: 99 mg/dL (ref 70–99)
Glucose-Capillary: 97 mg/dL (ref 70–99)
Glucose-Capillary: 150 mg/dL — ABNORMAL HIGH (ref 70–99)
Glucose-Capillary: 118 mg/dL — ABNORMAL HIGH (ref 70–99)

## 2023-12-23 LAB — URINALYSIS, ROUTINE W REFLEX MICROSCOPIC
Bilirubin Urine: NEGATIVE
Glucose, UA: NEGATIVE mg/dL
Hgb urine dipstick: NEGATIVE
Ketones, ur: 40 mg/dL — AB
Leukocytes,Ua: NEGATIVE
Nitrite: NEGATIVE
Protein, ur: 100 mg/dL — AB
Specific Gravity, Urine: 1.025 (ref 1.005–1.030)
pH: >=9 (ref 5.0–8.0)

## 2023-12-23 LAB — BASIC METABOLIC PANEL WITH GFR
Anion gap: 13 (ref 5–15)
BUN: 27 mg/dL — ABNORMAL HIGH (ref 6–20)
CO2: 23 mmol/L (ref 22–32)
Calcium: 9.3 mg/dL (ref 8.9–10.3)
Chloride: 103 mmol/L (ref 98–111)
Creatinine, Ser: 0.73 mg/dL (ref 0.44–1.00)
GFR, Estimated: >60 mL/min (ref >60)
Glucose, Bld: 163 mg/dL — ABNORMAL HIGH (ref 70–99)
Potassium: 4.1 mmol/L (ref 3.5–5.1)
Sodium: 139 mmol/L (ref 135–145)

## 2023-12-23 LAB — URINALYSIS, MICROSCOPIC (REFLEX)

## 2023-12-23 LAB — LIPASE, BLOOD: Lipase: 27 U/L (ref 11–51)

## 2023-12-23 SURGERY — LAPAROTOMY, EXPLORATORY
Anesthesia: General

## 2023-12-23 MED ORDER — OXYCODONE HCL 5 MG PO TABS: 5.0000 mg | ORAL_TABLET | ORAL | Status: DC | PRN

## 2023-12-23 MED ORDER — DEXAMETHASONE SOD PHOSPHATE PF 10 MG/ML IJ SOLN
INTRAMUSCULAR | Status: DC | PRN
  Administered 2023-12-23: 10 mg via INTRAVENOUS

## 2023-12-23 MED ORDER — SUCCINYLCHOLINE CHLORIDE 200 MG/10ML IV SOSY
PREFILLED_SYRINGE | INTRAVENOUS | Status: DC | PRN
  Administered 2023-12-23: 140 mg via INTRAVENOUS

## 2023-12-23 MED ORDER — METHOCARBAMOL 1000 MG/10ML IJ SOLN
500.0000 mg | Freq: Three times a day (TID) | INTRAMUSCULAR | Status: DC
  Administered 2023-12-23 – 2023-12-26 (×2): 500 mg via INTRAVENOUS
  Filled 2023-12-23 (×2): qty 10

## 2023-12-23 MED ORDER — PIPERACILLIN-TAZOBACTAM 3.375 G IVPB: 3.3750 g | Freq: Three times a day (TID) | INTRAVENOUS | Status: DC

## 2023-12-23 MED ORDER — SODIUM CHLORIDE (PF) 0.9 % IJ SOLN
INTRAMUSCULAR | Status: AC
  Filled 2023-12-23: qty 10

## 2023-12-23 MED ORDER — ACETAMINOPHEN 500 MG PO TABS
1000.0000 mg | ORAL_TABLET | Freq: Four times a day (QID) | ORAL | Status: DC
  Administered 2023-12-23 – 2023-12-26 (×9): 1000 mg via ORAL
  Filled 2023-12-23 (×11): qty 2

## 2023-12-23 MED ORDER — HYDROMORPHONE HCL 1 MG/ML IJ SOLN
INTRAMUSCULAR | Status: DC | PRN
  Administered 2023-12-23: .4 mg via INTRAVENOUS

## 2023-12-23 MED ORDER — MELATONIN 3 MG PO TABS: 3.0000 mg | ORAL_TABLET | Freq: Every evening | ORAL | Status: DC | PRN

## 2023-12-23 MED ORDER — SODIUM CHLORIDE 0.9 % IR SOLN
Status: DC | PRN
  Administered 2023-12-23: 2000 mL

## 2023-12-23 MED ORDER — LACTATED RINGERS IV SOLN: INTRAVENOUS | Status: AC

## 2023-12-23 MED ORDER — HYDROMORPHONE HCL 2 MG/ML IJ SOLN
INTRAMUSCULAR | Status: AC
  Filled 2023-12-23: qty 1

## 2023-12-23 MED ORDER — PANTOPRAZOLE SODIUM 40 MG IV SOLR
40.0000 mg | Freq: Once | INTRAVENOUS | Status: AC
  Administered 2023-12-23: 40 mg via INTRAVENOUS
  Filled 2023-12-23: qty 10

## 2023-12-23 MED ORDER — IBUPROFEN 600 MG PO TABS
600.0000 mg | ORAL_TABLET | Freq: Four times a day (QID) | ORAL | 1 refills | Status: AC
Start: 2023-12-23 — End: ?

## 2023-12-23 MED ORDER — CHLORHEXIDINE GLUCONATE 0.12 % MT SOLN
15.0000 mL | Freq: Once | OROMUCOSAL | Status: AC
  Administered 2023-12-23: 15 mL via OROMUCOSAL

## 2023-12-23 MED ORDER — DIPHENHYDRAMINE HCL 50 MG/ML IJ SOLN: 25.0000 mg | Freq: Four times a day (QID) | INTRAMUSCULAR | Status: DC | PRN

## 2023-12-23 MED ORDER — IOHEXOL 300 MG/ML  SOLN
100.0000 mL | Freq: Once | INTRAMUSCULAR | Status: AC | PRN
  Administered 2023-12-23: 100 mL via INTRAVENOUS

## 2023-12-23 MED ORDER — ENOXAPARIN SODIUM 40 MG/0.4ML IJ SOSY
40.0000 mg | PREFILLED_SYRINGE | INTRAMUSCULAR | Status: DC
  Administered 2023-12-24 – 2023-12-25 (×2): 40 mg via SUBCUTANEOUS
  Filled 2023-12-23 (×2): qty 0.4

## 2023-12-23 MED ORDER — HYDROMORPHONE HCL 1 MG/ML IJ SOLN
1.0000 mg | INTRAMUSCULAR | Status: DC | PRN
  Administered 2023-12-23: 1 mg via INTRAVENOUS
  Filled 2023-12-23: qty 1

## 2023-12-23 MED ORDER — PROPOFOL 10 MG/ML IV BOLUS
INTRAVENOUS | Status: DC | PRN
  Administered 2023-12-23: 110 mg via INTRAVENOUS
  Administered 2023-12-23: 30 mg via INTRAVENOUS

## 2023-12-23 MED ORDER — POLYETHYLENE GLYCOL 3350 17 G PO PACK: 17.0000 g | PACK | Freq: Every day | ORAL | 1 refills | Status: AC

## 2023-12-23 MED ORDER — METOPROLOL TARTRATE 5 MG/5ML IV SOLN: 5.0000 mg | Freq: Four times a day (QID) | INTRAVENOUS | Status: DC | PRN

## 2023-12-23 MED ORDER — SUGAMMADEX SODIUM 200 MG/2ML IV SOLN
INTRAVENOUS | Status: DC | PRN
  Administered 2023-12-23: 200 mg via INTRAVENOUS

## 2023-12-23 MED ORDER — DIPHENHYDRAMINE HCL 25 MG PO CAPS: 25.0000 mg | ORAL_CAPSULE | Freq: Four times a day (QID) | ORAL | Status: DC | PRN

## 2023-12-23 MED ORDER — PIPERACILLIN-TAZOBACTAM 3.375 G IVPB 30 MIN: 3.3750 g | Freq: Three times a day (TID) | INTRAVENOUS | Status: DC

## 2023-12-23 MED ORDER — ORAL CARE MOUTH RINSE: 15.0000 mL | Freq: Once | OROMUCOSAL | Status: AC

## 2023-12-23 MED ORDER — FENTANYL CITRATE (PF) 50 MCG/ML IJ SOSY
50.0000 ug | PREFILLED_SYRINGE | Freq: Once | INTRAMUSCULAR | Status: AC
  Administered 2023-12-23: 50 ug via INTRAVENOUS
  Filled 2023-12-23: qty 1

## 2023-12-23 MED ORDER — LACTATED RINGERS IV BOLUS
1000.0000 mL | Freq: Once | INTRAVENOUS | Status: AC
  Administered 2023-12-23: 1000 mL via INTRAVENOUS

## 2023-12-23 MED ORDER — ACETAMINOPHEN 500 MG PO TABS: 1000.0000 mg | ORAL_TABLET | Freq: Four times a day (QID) | ORAL | 3 refills | Status: AC

## 2023-12-23 MED ORDER — ROCURONIUM BROMIDE 10 MG/ML (PF) SYRINGE
PREFILLED_SYRINGE | INTRAVENOUS | Status: AC
  Filled 2023-12-23: qty 10

## 2023-12-23 MED ORDER — OXYCODONE HCL 5 MG PO TABS: 5.0000 mg | ORAL_TABLET | ORAL | 0 refills | Status: AC | PRN

## 2023-12-23 MED ORDER — HYDRALAZINE HCL 20 MG/ML IJ SOLN: 10.0000 mg | INTRAMUSCULAR | Status: DC | PRN

## 2023-12-23 MED ORDER — TRIMETHOBENZAMIDE HCL 100 MG/ML IM SOLN
200.0000 mg | INTRAMUSCULAR | Status: AC
  Administered 2023-12-23: 200 mg via INTRAMUSCULAR
  Filled 2023-12-23: qty 2

## 2023-12-23 MED ORDER — LACTATED RINGERS IV SOLN: INTRAVENOUS | Status: DC

## 2023-12-23 MED ORDER — INSULIN ASPART 100 UNIT/ML IJ SOLN
0.0000 [IU] | Freq: Three times a day (TID) | INTRAMUSCULAR | Status: DC
  Administered 2023-12-24: 3 [IU] via SUBCUTANEOUS

## 2023-12-23 MED ORDER — HYDROMORPHONE HCL 1 MG/ML IJ SOLN
0.5000 mg | INTRAMUSCULAR | Status: AC | PRN
  Administered 2023-12-23 (×4): 0.5 mg via INTRAVENOUS

## 2023-12-23 MED ORDER — OXYCODONE HCL 5 MG PO TABS: 5.0000 mg | ORAL_TABLET | Freq: Once | ORAL | Status: DC | PRN

## 2023-12-23 MED ORDER — PROPOFOL 10 MG/ML IV BOLUS
INTRAVENOUS | Status: AC
  Filled 2023-12-23: qty 20

## 2023-12-23 MED ORDER — FENTANYL CITRATE (PF) 100 MCG/2ML IJ SOLN
INTRAMUSCULAR | Status: AC
  Filled 2023-12-23: qty 2

## 2023-12-23 MED ORDER — ATORVASTATIN CALCIUM 20 MG PO TABS
40.0000 mg | ORAL_TABLET | Freq: Every day | ORAL | Status: DC
  Administered 2023-12-23 – 2023-12-25 (×3): 40 mg via ORAL
  Filled 2023-12-23 (×3): qty 2

## 2023-12-23 MED ORDER — PIPERACILLIN-TAZOBACTAM 3.375 G IVPB 30 MIN
3.3750 g | Freq: Once | INTRAVENOUS | Status: AC
  Administered 2023-12-23: 3.375 g via INTRAVENOUS
  Filled 2023-12-23: qty 50

## 2023-12-23 MED ORDER — METHOCARBAMOL 500 MG PO TABS
500.0000 mg | ORAL_TABLET | Freq: Three times a day (TID) | ORAL | Status: DC
  Administered 2023-12-23 – 2023-12-25 (×7): 500 mg via ORAL
  Filled 2023-12-23 (×8): qty 1

## 2023-12-23 MED ORDER — ONDANSETRON 4 MG PO TBDP: 4.0000 mg | ORAL_TABLET | Freq: Four times a day (QID) | ORAL | Status: DC | PRN

## 2023-12-23 MED ORDER — ONDANSETRON HCL 4 MG/2ML IJ SOLN
4.0000 mg | Freq: Four times a day (QID) | INTRAMUSCULAR | Status: DC | PRN
  Filled 2023-12-23: qty 2

## 2023-12-23 MED ORDER — METHOCARBAMOL 750 MG PO TABS: 750.0000 mg | ORAL_TABLET | Freq: Four times a day (QID) | ORAL | 1 refills | Status: AC

## 2023-12-23 MED ORDER — ONDANSETRON HCL 4 MG/2ML IJ SOLN
INTRAMUSCULAR | Status: DC | PRN
  Administered 2023-12-23: 4 mg via INTRAVENOUS

## 2023-12-23 MED ORDER — LIDOCAINE HCL (PF) 2 % IJ SOLN
INTRAMUSCULAR | Status: DC | PRN
  Administered 2023-12-23: 60 mg via INTRADERMAL

## 2023-12-23 MED ORDER — HYDROMORPHONE HCL 1 MG/ML IJ SOLN: 0.2500 mg | INTRAMUSCULAR | Status: DC | PRN

## 2023-12-23 MED ORDER — ONDANSETRON HCL 4 MG/2ML IJ SOLN
INTRAMUSCULAR | Status: AC
  Filled 2023-12-23: qty 2

## 2023-12-23 MED ORDER — HYDROMORPHONE HCL 1 MG/ML IJ SOLN
INTRAMUSCULAR | Status: AC
  Filled 2023-12-23: qty 1

## 2023-12-23 MED ORDER — VANCOMYCIN HCL IN DEXTROSE 1-5 GM/200ML-% IV SOLN
1000.0000 mg | Freq: Once | INTRAVENOUS | Status: AC
  Administered 2023-12-23: 1000 mg via INTRAVENOUS
  Filled 2023-12-23: qty 200

## 2023-12-23 MED ORDER — MORPHINE SULFATE (PF) 2 MG/ML IV SOLN: 1.0000 mg | INTRAVENOUS | Status: DC | PRN

## 2023-12-23 MED ORDER — FENTANYL CITRATE (PF) 100 MCG/2ML IJ SOLN
INTRAMUSCULAR | Status: DC | PRN
  Administered 2023-12-23 (×4): 50 ug via INTRAVENOUS

## 2023-12-23 MED ORDER — PHENYLEPHRINE 80 MCG/ML (10ML) SYRINGE FOR IV PUSH (FOR BLOOD PRESSURE SUPPORT)
PREFILLED_SYRINGE | INTRAVENOUS | Status: AC
  Filled 2023-12-23: qty 10

## 2023-12-23 MED ORDER — ONDANSETRON HCL 4 MG/2ML IJ SOLN: 4.0000 mg | Freq: Four times a day (QID) | INTRAMUSCULAR | Status: DC | PRN

## 2023-12-23 MED ORDER — KETOROLAC TROMETHAMINE 15 MG/ML IJ SOLN
15.0000 mg | Freq: Four times a day (QID) | INTRAMUSCULAR | Status: DC
  Administered 2023-12-23 – 2023-12-26 (×11): 15 mg via INTRAVENOUS
  Filled 2023-12-23 (×12): qty 1

## 2023-12-23 MED ORDER — LIDOCAINE HCL (PF) 2 % IJ SOLN
INTRAMUSCULAR | Status: AC
  Filled 2023-12-23: qty 5

## 2023-12-23 MED ORDER — MIDAZOLAM HCL 5 MG/5ML IJ SOLN
INTRAMUSCULAR | Status: DC | PRN
  Administered 2023-12-23: 2 mg via INTRAVENOUS

## 2023-12-23 MED ORDER — PANTOPRAZOLE SODIUM 20 MG PO TBEC
20.0000 mg | DELAYED_RELEASE_TABLET | Freq: Every day | ORAL | Status: DC
  Administered 2023-12-23 – 2023-12-26 (×4): 20 mg via ORAL
  Filled 2023-12-23 (×4): qty 1

## 2023-12-23 MED ORDER — ONDANSETRON HCL 4 MG/2ML IJ SOLN
4.0000 mg | Freq: Once | INTRAMUSCULAR | Status: AC
  Administered 2023-12-23: 4 mg via INTRAVENOUS
  Filled 2023-12-23: qty 2

## 2023-12-23 MED ORDER — AMISULPRIDE (ANTIEMETIC) 5 MG/2ML IV SOLN
10.0000 mg | Freq: Once | INTRAVENOUS | Status: DC | PRN
Start: 2023-12-23 — End: 2023-12-23

## 2023-12-23 MED ORDER — INSULIN ASPART 100 UNIT/ML IJ SOLN
4.0000 [IU] | Freq: Three times a day (TID) | INTRAMUSCULAR | Status: DC
Start: 2023-12-24 — End: 2023-12-24

## 2023-12-23 MED ORDER — FAMOTIDINE 20 MG PO TABS
20.0000 mg | ORAL_TABLET | Freq: Every day | ORAL | Status: DC
  Administered 2023-12-24 – 2023-12-26 (×3): 20 mg via ORAL
  Filled 2023-12-23 (×3): qty 1

## 2023-12-23 MED ORDER — OXYCODONE HCL 5 MG/5ML PO SOLN: 5.0000 mg | Freq: Once | ORAL | Status: DC | PRN

## 2023-12-23 MED ORDER — FENTANYL CITRATE (PF) 50 MCG/ML IJ SOSY
100.0000 ug | PREFILLED_SYRINGE | Freq: Once | INTRAMUSCULAR | Status: AC
  Administered 2023-12-23: 100 ug via INTRAVENOUS
  Filled 2023-12-23: qty 2

## 2023-12-23 MED ORDER — SIMETHICONE 80 MG PO CHEW: 40.0000 mg | CHEWABLE_TABLET | Freq: Four times a day (QID) | ORAL | Status: DC | PRN

## 2023-12-23 MED ORDER — ROCURONIUM BROMIDE 10 MG/ML (PF) SYRINGE
PREFILLED_SYRINGE | INTRAVENOUS | Status: DC | PRN
  Administered 2023-12-23: 45 mg via INTRAVENOUS
  Administered 2023-12-23: 20 mg via INTRAVENOUS
  Administered 2023-12-23: 5 mg via INTRAVENOUS

## 2023-12-23 MED ORDER — MIDAZOLAM HCL 2 MG/2ML IJ SOLN
INTRAMUSCULAR | Status: AC
  Filled 2023-12-23: qty 2

## 2023-12-23 MED ORDER — SUGAMMADEX SODIUM 200 MG/2ML IV SOLN
INTRAVENOUS | Status: AC
  Filled 2023-12-23: qty 2

## 2023-12-23 SURGICAL SUPPLY — 34 items
APPLICATOR COTTON TIP 6 STRL (MISCELLANEOUS) ×1 IMPLANT
BAG COUNTER SPONGE SURGICOUNT (BAG) IMPLANT
BLADE EXTENDED COATED 6.5IN (ELECTRODE) IMPLANT
BLADE HEX COATED 2.75 (ELECTRODE) ×1 IMPLANT
CHLORAPREP W/TINT 26 (MISCELLANEOUS) ×1 IMPLANT
COVER MAYO STAND STRL (DRAPES) ×1 IMPLANT
COVER SURGICAL LIGHT HANDLE (MISCELLANEOUS) ×1 IMPLANT
DRAPE LAPAROSCOPIC ABDOMINAL (DRAPES) ×1 IMPLANT
DRAPE WARM FLUID 44X44 (DRAPES) IMPLANT
DRSG OPSITE POSTOP 4X8 (GAUZE/BANDAGES/DRESSINGS) IMPLANT
ELECT REM PT RETURN 15FT ADLT (MISCELLANEOUS) ×1 IMPLANT
GAUZE SPONGE 4X4 12PLY STRL (GAUZE/BANDAGES/DRESSINGS) ×1 IMPLANT
GLOVE BIO SURGEON STRL SZ 6.5 (GLOVE) ×1 IMPLANT
GLOVE BIOGEL PI IND STRL 6 (GLOVE) ×1 IMPLANT
GOWN STRL REUS W/ TWL LRG LVL3 (GOWN DISPOSABLE) ×1 IMPLANT
GOWN STRL REUS W/ TWL XL LVL3 (GOWN DISPOSABLE) IMPLANT
HANDLE SUCTION POOLE (INSTRUMENTS) IMPLANT
KIT BASIN OR (CUSTOM PROCEDURE TRAY) ×1 IMPLANT
KIT TURNOVER KIT A (KITS) ×1 IMPLANT
NS IRRIG 1000ML POUR BTL (IV SOLUTION) ×1 IMPLANT
PACK GENERAL/GYN (CUSTOM PROCEDURE TRAY) ×1 IMPLANT
STAPLER SKIN PROX 35W (STAPLE) ×1 IMPLANT
SUT NOV 1 T60/GS (SUTURE) IMPLANT
SUT PDS AB 1 TP1 96 (SUTURE) IMPLANT
SUT SILK 2 0 SH CR/8 (SUTURE) IMPLANT
SUT SILK 2-0 18XBRD TIE 12 (SUTURE) IMPLANT
SUT SILK 3 0 SH CR/8 (SUTURE) IMPLANT
SUT SILK 3-0 18XBRD TIE 12 (SUTURE) IMPLANT
SUT VIC AB 2-0 SH 18 (SUTURE) IMPLANT
SUT VICRYL 2 0 18 UND BR (SUTURE) IMPLANT
TOWEL OR 17X26 10 PK STRL BLUE (TOWEL DISPOSABLE) IMPLANT
TRAY FOLEY MTR SLVR 16FR STAT (SET/KITS/TRAYS/PACK) IMPLANT
WATER STERILE IRR 1000ML POUR (IV SOLUTION) ×1 IMPLANT
YANKAUER SUCT BULB TIP NO VENT (SUCTIONS) IMPLANT

## 2023-12-23 NOTE — ED Provider Notes (Signed)
 Patient sent to the emergency room with perforation.  General surgery aware of the patient, they will see the patient and will admit.  Patient has already received Zosyn .  Hemodynamically stable.   Charlyn Sora, MD 12/23/23 0830

## 2023-12-23 NOTE — ED Provider Notes (Signed)
 [place] [facility] Provider Note   CSN: [id number] Arrival date & time: 12/23/23  9643     Patient presents with: Abdominal Pain   Katherine Cabrera is a 48 y.o. female.   The history is provided by the patient and the spouse.  Abdominal Pain Pain location:  RUQ Pain radiates to:  R flank Pain severity:  Severe Onset quality:  Sudden Duration: less than 1 hour. Chronicity:  New Context: not eating and not trauma   Relieved by:  Nothing Worsened by:  Nothing Ineffective treatments:  None tried Associated symptoms: nausea   Associated symptoms: no fever and no flatus   Associated symptoms comment:  Vomiting began in the ED Risk factors: multiple surgeries   Risk factors: not pregnant   Patient with DM and HTN with a history of a post operative intra-abdominal abscess in early 2022 presents with sudden onset RUQ pain with radiation to the right flank.  No fevers, some nausea.  Last meal or ingestion was a cheeseburger at 3 pm.      Past Medical History:  Diagnosis Date   Everitt Quervain's disease (radial styloid tenosynovitis)    Diabetes mellitus without complication (HCC)    Hypertension      Prior to Admission medications   Medication Sig Start Date End Date Taking? Authorizing Provider  acetaminophen  (TYLENOL ) 325 MG tablet Take 2 tablets (650 mg total) by mouth every 6 (six) hours as needed for mild pain (or Fever >/= 101). 03/23/20   Regalado, Belkys A, MD  atorvastatin  (LIPITOR) 40 MG tablet Take 40 mg by mouth at bedtime. 02/12/20   [provider]  cetirizine  (ZYRTEC  ALLERGY) 10 MG tablet Take 1 tablet (10 mg total) by mouth daily as needed for allergies (itch). 09/25/22   Silver Wonda LABOR, PA  Continuous Blood Gluc Sensor (FREESTYLE LIBRE 14 DAY SENSOR) MISC Apply 1 each topically every 14 (fourteen) days. 02/09/20   [provider]  EPINEPHrine  0.3 mg/0.3 mL IJ SOAJ injection Inject 0.3 mg into the muscle as  needed for anaphylaxis. 09/25/22   Silver Wonda LABOR, PA  famotidine  (PEPCID ) 20 MG tablet Take 1 tablet (20 mg total) by mouth daily. 03/23/20   Regalado, Belkys A, MD  Multiple Vitamin (MULTI-VITAMIN) tablet Take 1 tablet by mouth daily.    [provider]    Allergies: Other    Review of Systems  Constitutional:  Negative for fever.  Gastrointestinal:  Positive for abdominal pain and nausea. Negative for flatus.  All other systems reviewed and are negative.   Updated Vital Signs BP (!) 153/108   Pulse (!) 119   Temp 97.6 F (36.4 C) (Oral)   Resp (!) 22   Ht 5' 3 (1.6 m)   Wt 79.8 kg   LMP 05/07/2019   SpO2 100%   BMI 31.18 kg/m   Physical Exam Vitals and nursing note reviewed.  Constitutional:      General: She is not in acute distress.    Appearance: She is well-developed.  HENT:     Head: Normocephalic and atraumatic.     Nose: Nose normal.  Eyes:     Pupils: Pupils are equal, round, and reactive to light.  Cardiovascular:     Rate and Rhythm: Normal rate and regular rhythm.     Pulses: Normal pulses.     Heart sounds: Normal heart sounds.  Pulmonary:     Effort: Pulmonary effort is normal. No respiratory distress.  Breath sounds: Normal breath sounds.  Abdominal:     General: Bowel sounds are normal. There is no distension.     Palpations: Abdomen is soft.     Tenderness: There is abdominal tenderness. There is guarding and rebound. Positive signs include Murphy's sign.     Hernia: No hernia is present.  Musculoskeletal:        General: Normal range of motion.     Cervical back: Neck supple.  Skin:    General: Skin is dry.     Capillary Refill: Capillary refill takes less than 2 seconds.     Findings: No erythema or rash.  Neurological:     General: No focal deficit present.     Deep Tendon Reflexes: Reflexes normal.  Psychiatric:        Mood and Affect: Mood normal.     (all labs ordered are listed, but only abnormal results are  displayed) Results for orders placed or performed during the hospital encounter of 12/23/23  CBC with Differential   Collection Time: 12/23/23  4:08 AM  Result Value Ref Range   WBC 6.7 4.0 - 10.5 K/uL   RBC 4.85 3.87 - 5.11 MIL/uL   Hemoglobin 13.5 12.0 - 15.0 g/dL   HCT 58.8 63.9 - 53.9 %   MCV 84.7 80.0 - 100.0 fL   MCH 27.8 26.0 - 34.0 pg   MCHC 32.8 30.0 - 36.0 g/dL   RDW 87.2 88.4 - 84.4 %   Platelets 234 150 - 400 K/uL   nRBC 0.0 0.0 - 0.2 %   Neutrophils Relative % 34 %   Neutro Abs 2.2 1.7 - 7.7 K/uL   Lymphocytes Relative 49 %   Lymphs Abs 3.3 0.7 - 4.0 K/uL   Monocytes Relative 10 %   Monocytes Absolute 0.6 0.1 - 1.0 K/uL   Eosinophils Relative 7 %   Eosinophils Absolute 0.5 0.0 - 0.5 K/uL   Basophils Relative 0 %   Basophils Absolute 0.0 0.0 - 0.1 K/uL   Immature Granulocytes 0 %   Abs Immature Granulocytes 0.01 0.00 - 0.07 K/uL  Basic metabolic panel   Collection Time: 12/23/23  4:08 AM  Result Value Ref Range   Sodium 139 135 - 145 mmol/L   Potassium 4.1 3.5 - 5.1 mmol/L   Chloride 103 98 - 111 mmol/L   CO2 23 22 - 32 mmol/L   Glucose, Bld 163 (H) 70 - 99 mg/dL   BUN 27 (H) 6 - 20 mg/dL   Creatinine, Ser 9.26 0.44 - 1.00 mg/dL   Calcium  9.3 8.9 - 10.3 mg/dL   GFR, Estimated >39 >39 mL/min   Anion gap 13 5 - 15  Hepatic function panel   Collection Time: 12/23/23  4:08 AM  Result Value Ref Range   Total Protein 6.8 6.5 - 8.1 g/dL   Albumin  4.1 3.5 - 5.0 g/dL   AST 23 15 - 41 U/L   ALT 19 0 - 44 U/L   Alkaline Phosphatase 100 38 - 126 U/L   Total Bilirubin 0.4 0.0 - 1.2 mg/dL   Bilirubin, Direct 0.1 0.0 - 0.2 mg/dL   Indirect Bilirubin 0.3 0.3 - 0.9 mg/dL  Lipase, blood   Collection Time: 12/23/23  4:08 AM  Result Value Ref Range   Lipase 27 11 - 51 U/L  Urinalysis, Routine w reflex microscopic -Urine, Clean Catch   Collection Time: 12/23/23  4:30 AM  Result Value Ref Range   Color, Urine YELLOW YELLOW  APPearance CLEAR CLEAR   Specific Gravity,  Urine 1.025 1.005 - 1.030   pH >=9.0 5.0 - 8.0   Glucose, UA NEGATIVE NEGATIVE mg/dL   Hgb urine dipstick NEGATIVE NEGATIVE   Bilirubin Urine NEGATIVE NEGATIVE   Ketones, ur 40 (A) NEGATIVE mg/dL   Protein, ur 899 (A) NEGATIVE mg/dL   Nitrite NEGATIVE NEGATIVE   Leukocytes,Ua NEGATIVE NEGATIVE  Urinalysis, Microscopic (reflex)   Collection Time: 12/23/23  4:30 AM  Result Value Ref Range   RBC / HPF 0-5 0 - 5 RBC/hpf   WBC, UA 0-5 0 - 5 WBC/hpf   Bacteria, UA RARE (A) NONE SEEN   Squamous Epithelial / HPF 0-5 0 - 5 /HPF   Mucus PRESENT    CT ABDOMEN PELVIS W CONTRAST Result Date: 12/23/2023 EXAM: CT ABDOMEN AND PELVIS WITH CONTRAST 12/23/2023 04:57:54 AM TECHNIQUE: CT of the abdomen and pelvis was performed with the administration of 100 mL of iohexol  (OMNIPAQUE ) 300 MG/ML solution. Multiplanar reformatted images are provided for review. Automated exposure control, iterative reconstruction, and/or weight-based adjustment of the mA/kV was utilized to reduce the radiation dose to as low as reasonably achievable. COMPARISON: CT abdomen and pelvis with intravenous contrast from 03/06/2020 and 03/14/2020. CLINICAL HISTORY: Right lower quadrant abdominal pain with nausea. History of diabetes, hysterectomy, hernia repair. FINDINGS: LOWER CHEST: No acute abnormality. LIVER: The liver is unremarkable. GALLBLADDER AND BILE DUCTS: Gallbladder is unremarkable. No biliary ductal dilatation. SPLEEN: No acute abnormality. PANCREAS: No acute abnormality. ADRENAL GLANDS: No acute abnormality. KIDNEYS, URETERS AND BLADDER: No stones in the kidneys or ureters. No hydronephrosis. No perinephric or periureteral stranding. Urinary bladder is unremarkable. GI AND BOWEL: There are thickened folds in the antropyloric stomach consistent with gastritis or peptic ulcer disease. There is a large air-fluid collection with patchy air in the fluid, anterior mid abdomen measuring 14.1 x 8 cm on axial series 301 image 44, 11.2 cm  in height on coronal reconstruction series 601 image 112. This does appear consistent with an abscess, but is suspected to be in communication with a thick-walled small bowel segment along the inferior aspect of the collection near the midline and best seen on coronal reconstruction images 92 through 98. Underlying etiology could be infectious or inflammatory enteritis with a perforation resulting in an abscess, or a neoplasm which perforated. Rest of the small bowel is normal caliber above the abnormality, is mildly dilated below the abnormality up to 3.2 cm caliber inferior to the collection. There is mesenteric swirling in the right lower quadrant axial 48-61 compatible with an internal hernia, which could be intermittently obstructing. The appendix is normal in caliber. Large bowel is unremarkable apart from mild constipation. There is no free air or bowel pneumatosis. PERITONEUM AND RETROPERITONEUM: There is a small volume of low-density free fluid in the pelvic cul-de-sac. No free air. VASCULATURE: Aorta is normal in caliber. LYMPH NODES: No lymphadenopathy. REPRODUCTIVE ORGANS: The uterus is absent. No adnexal mass is seen. BONES AND SOFT TISSUES: There is chronic bilateral nonerosive sacroiliitis which may be seen with inflammatory bowel disease. There are mild degenerative changes in the lumbar spine. No acute or other significant osseous findings. No focal soft tissue abnormality. IMPRESSION: 1. Large anterior mid-abdominal abscess measuring up to 14.1 x 8 x 11.2 cm, suspected in communication with a thick-walled small bowel segment underneath it, with possible etiologies including infectious or inflammatory enteritis with perforation versus perforated neoplasm. 2. Mesenteric swirling in the right lower quadrant compatible with an internal hernia, which could  be intermittently obstructing. There is mild dilatation of the pelvic small bowel segments. 3. Thickened folds in the antropyloric stomach consistent  with gastritis or peptic ulcer disease. 4. 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 Electronically signed by: Francis Quam MD 12/23/2023 05:29 AM EDT RP Workstation: [id number]     Radiology: CT ABDOMEN PELVIS W CONTRAST Result Date: 12/23/2023 EXAM: CT ABDOMEN AND PELVIS WITH CONTRAST 12/23/2023 04:57:54 AM TECHNIQUE: CT of the abdomen and pelvis was performed with the administration of 100 mL of iohexol  (OMNIPAQUE ) 300 MG/ML solution. Multiplanar reformatted images are provided for review. Automated exposure control, iterative reconstruction, and/or weight-based adjustment of the mA/kV was utilized to reduce the radiation dose to as low as reasonably achievable. COMPARISON: CT abdomen and pelvis with intravenous contrast from 03/06/2020 and 03/14/2020. CLINICAL HISTORY: Right lower quadrant abdominal pain with nausea. History of diabetes, hysterectomy, hernia repair. FINDINGS: LOWER CHEST: No acute abnormality. LIVER: The liver is unremarkable. GALLBLADDER AND BILE DUCTS: Gallbladder is unremarkable. No biliary ductal dilatation. SPLEEN: No acute abnormality. PANCREAS: No acute abnormality. ADRENAL GLANDS: No acute abnormality. KIDNEYS, URETERS AND BLADDER: No stones in the kidneys or ureters. No hydronephrosis. No perinephric or periureteral stranding. Urinary bladder is unremarkable. GI AND BOWEL: There are thickened folds in the antropyloric stomach consistent with gastritis or peptic ulcer disease. There is a large air-fluid collection with patchy air in the fluid, anterior mid abdomen measuring 14.1 x 8 cm on axial series 301 image 44, 11.2 cm in height on coronal reconstruction series 601 image 112. This does appear consistent with an abscess, but is suspected to be in communication with a thick-walled small bowel segment along the inferior aspect of the collection near the midline and best seen on coronal reconstruction images 92 through 98.  Underlying etiology could be infectious or inflammatory enteritis with a perforation resulting in an abscess, or a neoplasm which perforated. Rest of the small bowel is normal caliber above the abnormality, is mildly dilated below the abnormality up to 3.2 cm caliber inferior to the collection. There is mesenteric swirling in the right lower quadrant axial 48-61 compatible with an internal hernia, which could be intermittently obstructing. The appendix is normal in caliber. Large bowel is unremarkable apart from mild constipation. There is no free air or bowel pneumatosis. PERITONEUM AND RETROPERITONEUM: There is a small volume of low-density free fluid in the pelvic cul-de-sac. No free air. VASCULATURE: Aorta is normal in caliber. LYMPH NODES: No lymphadenopathy. REPRODUCTIVE ORGANS: The uterus is absent. No adnexal mass is seen. BONES AND SOFT TISSUES: There is chronic bilateral nonerosive sacroiliitis which may be seen with inflammatory bowel disease. There are mild degenerative changes in the lumbar spine. No acute or other significant osseous findings. No focal soft tissue abnormality. IMPRESSION: 1. Large anterior mid-abdominal abscess measuring up to 14.1 x 8 x 11.2 cm, suspected in communication with a thick-walled small bowel segment underneath it, with possible etiologies including infectious or inflammatory enteritis with perforation versus perforated neoplasm. 2. Mesenteric swirling in the right lower quadrant compatible with an internal hernia, which could be intermittently obstructing. There is mild dilatation of the pelvic small bowel segments. 3. Thickened folds in the antropyloric stomach consistent with gastritis or peptic ulcer disease. 4. 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 Electronically signed by: Francis Quam MD 12/23/2023 05:29 AM EDT RP Workstation: [id number]     .Critical Care  Performed by: Nettie Earing, MD Authorized by:  Paxtyn Wisdom,  Camron Essman, MD   Critical care provider statement:    Critical care time (minutes):  30   Critical care end time:  12/23/2023 6:09 AM   Critical care was necessary to treat or prevent imminent or life-threatening deterioration of the following conditions: perforation of abdominal viscus.   Critical care was time spent personally by me on the following activities:  Development of treatment plan with patient or surrogate, discussions with consultants, evaluation of patient's response to treatment, examination of patient, ordering and review of laboratory studies, ordering and review of radiographic studies, ordering and performing treatments and interventions, pulse oximetry, re-evaluation of patient's condition and review of old charts   I assumed direction of critical care for this patient from another provider in my specialty: no     Care discussed with: accepting provider at another facility      Medications  vancomycin  (VANCOCIN ) IVPB 1000 mg/200 mL premix (has no administration in time range)  piperacillin -tazobactam (ZOSYN ) IVPB 3.375 g (3.375 g Intravenous New Bag/Given 12/23/23 0604)  trimethobenzamide (TIGAN) injection 200 mg (has no administration in time range)  lactated ringers  bolus 1,000 mL (has no administration in time range)  ondansetron  (ZOFRAN ) injection 4 mg (4 mg Intravenous Given 12/23/23 0415)  fentaNYL  (SUBLIMAZE ) injection 50 mcg (50 mcg Intravenous Given 12/23/23 0420)  iohexol  (OMNIPAQUE ) 300 MG/ML solution 100 mL (100 mLs Intravenous Contrast Given 12/23/23 0451)  pantoprazole (PROTONIX) injection 40 mg (40 mg Intravenous Given 12/23/23 0559)                                  Medical Decision Making Patient with sudden onset RUQ pain and nausea, last meal 3 pm   Amount and/or Complexity of Data Reviewed Independent Historian: spouse    Details: See above  External Data Reviewed: radiology and notes.    Details: Previous notes and CT reviewed  Labs:  ordered.    Details: Normal white count 6.7 (normal differential), normal hemoglobin 13.5, normal platelets.  Normal sodium 139, normal potassium 4.1, normal creatinine. Normal LFTs, normal lipase 27. Urine is negative for UTI.  Blood cultures sent  Radiology: ordered. Discussion of management or test interpretation with external provider(s): 5:46 AM case d/w Dr. Debby of general surgery.  No NGT at this time.  Please transfer ED to ED for [facility] to see.    Risk Prescription drug management. Decision regarding hospitalization.     Final diagnoses:  Abscess  Internal hernia  Bowel perforation (HCC)   The patient appears reasonably stabilized for admission considering the current resources, flow, and capabilities available in the ED at this time, and I doubt any other [facility] requiring further screening and/or treatment in the ED prior to admission.  ED Discharge Orders     None          Toshio Slusher, MD 12/23/23 [id number]

## 2023-12-23 NOTE — Anesthesia Postprocedure Evaluation (Signed)
 Anesthesia Post Note  Patient: Katherine Cabrera  Procedure(s) Performed: LAPAROTOMY, EXPLORATORY     Patient location during evaluation: PACU Anesthesia Type: General Level of consciousness: awake and alert Pain management: pain level controlled Vital Signs Assessment: post-procedure vital signs reviewed and stable Respiratory status: spontaneous breathing, nonlabored ventilation, respiratory function stable and patient connected to nasal cannula oxygen Cardiovascular status: blood pressure returned to baseline and stable Postop Assessment: no apparent nausea or vomiting Anesthetic complications: no   No notable events documented.  Last Vitals:  Vitals:   12/23/23 1600 12/23/23 1630  BP: 111/76 (!) 129/94  Pulse: 89 (!) 51  Resp: 15 18  Temp:  (!) 36.3 C  SpO2: 93% 100%    Last Pain:  Vitals:   12/23/23 1630  TempSrc: Oral  PainSc:                  [name] L Ichael Pullara

## 2023-12-23 NOTE — Discharge Instructions (Addendum)
 [facility]  SURGERY: POST OP INSTRUCTIONS Always review your discharge instruction sheet given to you by the facility where your surgery was performed. IF YOU HAVE DISABILITY OR FAMILY LEAVE FORMS, YOU MUST BRING THEM TO THE OFFICE FOR PROCESSING.   DO NOT GIVE THEM TO YOUR DOCTOR.  PAIN CONTROL  Pain regimen: take over-the-counter tylenol  (acetaminophen ) 1000mg  every six hours, the prescription ibuprofen  (600mg ) every six hours and the robaxin (methocarbamol) 750mg  every six hours. With all three of these, you should be taking something every two hours. Example: tylenol  (acetaminophen ) at 8am, ibuprofen  at 10am, robaxin (methocarbamol) at 12pm, tylenol  (acetaminophen ) again at 2pm, ibuprofen  again at 4pm, robaxin (methocarbamol) at 6pm. You also have a prescription for oxycodone , which should be taken if the tylenol  (acetaminophen ), ibuprofen , and robaxin (methocarbamol) are not enough to control your pain. You may take the oxycodone  as frequently as every four hours as needed, but if you are taking the other medications as above, you should not need the oxycodone  this frequently. You have also been given a prescription for Miralax which is a stool softener. Please take this as prescribed because the oxycodone  can cause constipation and the Miralax will minimize or prevent constipation. Do not drive while taking or under the influence of the oxycodone  as it is a narcotic medication. Use ice packs to help control pain.  If you need a refill on your pain medication, please contact your pharmacy.  They will contact our office to request authorization. Prescriptions will not be filled after 5pm or on week-ends.  HOME MEDICATIONS Take your usually prescribed medications unless otherwise directed.  DIET You should follow a light diet the first few days after arrival home.  Be sure to include lots of fluids daily.  Do not consume alcohol while taking oxycodone  or ibuprofen .    CONSTIPATION It is common to experience some constipation after surgery and if you are taking pain medication. Constipation will make your abdominal pain worse, so it is best to try to prevent it by increasing fluid intake and taking a stool softener. You have already been given a prescription for a mild laxative, Miralax, which you should be taking once daily. You can increase the Miralax to twice daily or even three times daily until you have a bowel movement. If still no bowel movement 24 hours after taking Miralax three times in one day, you may try magnesium  citrate, available over the counter at a local pharmacy.   WOUND/INCISION CARE Most patients will experience some swelling and bruising in the area of the incisions.  Ice packs will help.  Swelling and bruising can take several days to resolve.  May shower beginning 12/24/2023.  Remove dressing on 12/27/2023. Once dressing is removed, may allow warm soapy water to run over incision, then rinse and pat dry.  Do not soak in any water (tubs, hot tubs, pools, lakes, oceans) for one week.   ACTIVITIES You may resume regular (light) daily activities beginning the next day--such as daily self-care, walking, climbing stairs--gradually increasing activities as tolerated.  You may have sexual intercourse when it is comfortable.   No lifting greater than 5 pounds for six weeks.  You may drive when you are no longer taking narcotic pain medication, you can comfortably wear a seatbelt, and you can safely maneuver your car and apply brakes.  FOLLOW-UP You should see your doctor in the office for a follow-up appointment approximately 2-3 weeks after your surgery.  You should have been  given your post-op/follow-up appointment when your surgery was scheduled.  If you did not receive a post-op/follow-up appointment, make sure that you call for this appointment within a day or two after you arrive home to ensure a convenient appointment time.  WHEN TO CALL  YOUR DOCTOR: Fever over 101.5 Inability to urinate Continued bleeding from incision. Increased pain, redness, or drainage from the incision. Increasing abdominal pain  The clinic staff is available to answer your questions during regular business hours.  Please don't hesitate to call and ask to speak to one of the nurses for clinical concerns.  If you have a medical emergency, go to the nearest emergency room or call 911.  A surgeon from [facility] is always on call at the hospital. [address], [address], [place], [place]  [address] ? [address] [address], [place], [place]   [address] [phone] ? [phone] ? FAX [phone] Web site: [email or web address]

## 2023-12-23 NOTE — Plan of Care (Signed)
  Problem: Education: Goal: Knowledge of General Education information will improve Description: Including pain rating scale, medication(s)/side effects and non-pharmacologic comfort measures Outcome: Progressing   Problem: Coping: Goal: Level of anxiety will decrease Outcome: Progressing   Problem: Pain Managment: Goal: General experience of comfort will improve and/or be controlled Outcome: Progressing

## 2023-12-23 NOTE — Transfer of Care (Signed)
 Immediate Anesthesia Transfer of Care Note  Patient: Steen Mano  Procedure(s) Performed: LAPAROTOMY, EXPLORATORY  Patient Location: PACU  Anesthesia Type:General  Level of Consciousness: awake, alert , oriented, and patient cooperative  Airway & Oxygen Therapy: Patient Spontanous Breathing and Patient connected to face mask oxygen  Post-op Assessment: Report given to RN and Post -op Vital signs reviewed and stable  Post vital signs: Reviewed and stable  Last Vitals:  Vitals Value Taken Time  BP 129/83 12/23/23 14:48  Temp    Pulse 89 12/23/23 14:52  Resp 12 12/23/23 14:52  SpO2 100 % 12/23/23 14:52  Vitals shown include unfiled device data.  Last Pain:  Vitals:   12/23/23 1215  TempSrc: Oral  PainSc:          Complications: No notable events documented.

## 2023-12-23 NOTE — ED Notes (Signed)
 RN at bedside

## 2023-12-23 NOTE — Anesthesia Procedure Notes (Signed)
 Procedure Name: Intubation Date/Time: 12/23/2023 1:26 PM  Performed by: Franchot Delon RAMAN, CRNAPre-anesthesia Checklist: Patient identified, Emergency Drugs available, Suction available and Patient being monitored Patient Re-evaluated:Patient Re-evaluated prior to induction Oxygen Delivery Method: Circle System Utilized Preoxygenation: Pre-oxygenation with 100% oxygen Induction Type: IV induction Ventilation: Mask ventilation without difficulty Laryngoscope Size: Glidescope and 3 Grade View: Grade I Tube type: Oral Tube size: 7.0 mm Number of attempts: 1 Airway Equipment and Method: Video-laryngoscopy and Rigid stylet Placement Confirmation: ETT inserted through vocal cords under direct vision, positive ETCO2 and breath sounds checked- equal and bilateral Secured at: 22 cm Tube secured with: Tape Dental Injury: Teeth and Oropharynx as per pre-operative assessment  Comments: Elective GlydeScope 3.0 used due to pt's dentition.

## 2023-12-23 NOTE — Op Note (Signed)
   Operative Note   Date: 12/23/2023  Procedure: exploratory laparotomy, reduction of cecal volvulus, cecopexy  Pre-op diagnosis: small bowel perforation, internal hernia Post-op diagnosis: cecal volvulus  Indication and clinical history: The patient is a 48 y.o. year old female with radiographic findings suggestive of small bowel perforation and internal hernia     Surgeon: Dreama GEANNIE Hanger, MD  Anesthesiologist: Niels, MD Anesthesia: General  Findings:  Specimen: none EBL: 10cc Drains/Implants: none  Disposition: PACU - hemodynamically stable.  Description of procedure: The patient was positioned supine on the operating room table. General anesthetic induction and intubation were uneventful. Foley catheter insertion was performed and was atraumatic. Time-out was performed verifying correct patient, procedure, signature of informed consent, and administration of pre-operative antibiotics. The abdomen was prepped and draped in the usual sterile fashion.  A midline incision was made and deepened through the fascia. Moderate volume serous fluid was encountered. 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 After reduction of the volvulus, the small bowel was run from ileocecal valve to ligament of Treitz. No abnormality was identified. The colon was inspected in its entirety and was also normal, other than being redundant. The cecum was pexied to the abdominal wall with four vicryl sutures. The remaining serous fluid in the pelvis was suctioned. The fascia was closed with #1 looped PDS suture and the skin closed with staples.   Sterile dressings were applied. All sponge and instrument counts were correct at the conclusion of the procedure. The patient was awakened from anesthesia, extubated uneventfully, and transported to the PACU in good condition. There were no complications.    Dreama GEANNIE Hanger, MD General  and Trauma Surgery [facility]

## 2023-12-23 NOTE — H&P (Signed)
 H&P Note  Katherine Cabrera 1975-12-16  [id number].    Requesting MD: April Palumbo, MD Chief Complaint/Reason for Consult: abdominal pain with concern for bowel perforation HPI:  Patient is a 48 year old female with PMH significant for HTN and T2DM who presented to [facility] with abdominal pain that woke her from sleep overnight. Pain was right sided and she initially thought maybe just some indigestion but progressively worsened. She started to have some nausea and vomiting. Emesis was primarily just what she had eaten the night before. She passed a little flatus but did not note any improvement in pain. No diarrhea, last BM was yesterday around 3 PM and was thicker than normal but not bloody or dark. She reports she has had a colonoscopy in the last year or so and there were no polyps or concerns for malignancy. She underwent hysterectomy 3 years ago and developed intra-abdominal abscess after that requiring laparoscopic abdominal washout with drains but she had been doing well since recovering from that. No known personal or family history of IBD. She denies alcohol, tobacco or illicit drug use. She is not on blood thinners. Allergy to an antifungal medication she took for a nail infection. She works at [employer].   ROS: Negative other than HPI  No family history on file.  Past Medical History:  Diagnosis Date   De Quervain's disease (radial styloid tenosynovitis)    Diabetes mellitus without complication (HCC)    Hypertension     Past Surgical History:  Procedure Laterality Date   ABDOMINAL HYSTERECTOMY     CESAREAN SECTION     HERNIA REPAIR     LAPAROSCOPY N/A 03/17/2020   Procedure: LAPAROSCOPY DIAGNOSTIC;  ABDOMINAL WASH OUT, PELVIC EXAM UNDER ANESTHESIA, RIGID SIGMOIDOSCOPY;  Surgeon: Signe Mitzie LABOR, MD;  Location: [facility] OR;  Service: General;  Laterality: N/A;    Social History:  reports that she has never smoked. She has never used smokeless tobacco. She reports that she  does not drink alcohol and does not use drugs.  Allergies:  Allergies  Allergen Reactions   Other Hives    ? Anti-fungal oral med    (Not in a hospital admission)   Blood pressure (!) 151/98, pulse 93, temperature 97.7 F (36.5 C), temperature source Oral, resp. rate 19, height 5' 3 (1.6 m), weight 79.8 kg, last menstrual period 05/07/2019, SpO2 99%. Physical Exam:  General: pleasant, WD, overweight female who is laying in bed and appears ill HEENT: head is normocephalic, atraumatic.  Sclera are noninjected.  Pupils equal and round.  Ears and nose without any masses or lesions.  Mouth is pink and moist Heart: regular, rate, and rhythm.  Palpable radial and pedal pulses bilaterally Lungs: CTAB, no wheezes, rhonchi, or rales noted.  Respiratory effort nonlabored Abd: soft, TTP throughout Right abdomen without peritonitis, mild distention, no masses, hernias, or organomegaly MS: all 4 extremities are symmetrical with no cyanosis, clubbing, or edema. Skin: warm and dry with no masses, lesions, or rashes Neuro: Cranial nerves 2-12 grossly intact, sensation is normal throughout Psych: A&Ox3 with an appropriate affect.   Results for orders placed or performed during the hospital encounter of 12/23/23 (from the past 48 hours)  CBC with Differential     Status: None   Collection Time: 12/23/23  4:08 AM  Result Value Ref Range   WBC 6.7 4.0 - 10.5 K/uL   RBC 4.85 3.87 - 5.11 MIL/uL   Hemoglobin 13.5 12.0 - 15.0 g/dL  HCT 41.1 36.0 - 46.0 %   MCV 84.7 80.0 - 100.0 fL   MCH 27.8 26.0 - 34.0 pg   MCHC 32.8 30.0 - 36.0 g/dL   RDW 87.2 88.4 - 84.4 %   Platelets 234 150 - 400 K/uL   nRBC 0.0 0.0 - 0.2 %   Neutrophils Relative % 34 %   Neutro Abs 2.2 1.7 - 7.7 K/uL   Lymphocytes Relative 49 %   Lymphs Abs 3.3 0.7 - 4.0 K/uL   Monocytes Relative 10 %   Monocytes Absolute 0.6 0.1 - 1.0 K/uL   Eosinophils Relative 7 %   Eosinophils Absolute 0.5 0.0 - 0.5 K/uL   Basophils Relative 0 %    Basophils Absolute 0.0 0.0 - 0.1 K/uL   Immature Granulocytes 0 %   Abs Immature Granulocytes 0.01 0.00 - 0.07 K/uL    Comment: Performed at [facility], [address] [place], [place] [address]  Basic metabolic panel     Status: Abnormal   Collection Time: 12/23/23  4:08 AM  Result Value Ref Range   Sodium 139 135 - 145 mmol/L   Potassium 4.1 3.5 - 5.1 mmol/L   Chloride 103 98 - 111 mmol/L   CO2 23 22 - 32 mmol/L   Glucose, Bld 163 (H) 70 - 99 mg/dL    Comment: Glucose reference range applies only to samples taken after fasting for at least 8 hours.   BUN 27 (H) 6 - 20 mg/dL   Creatinine, Ser 9.26 0.44 - 1.00 mg/dL   Calcium  9.3 8.9 - 10.3 mg/dL   GFR, Estimated >39 >39 mL/min    Comment: (NOTE) Calculated using the CKD-EPI Creatinine Equation (2021)    Anion gap 13 5 - 15    Comment: Performed at [facility], [address] [place], [place] [address]  Hepatic function panel     Status: None   Collection Time: 12/23/23  4:08 AM  Result Value Ref Range   Total Protein 6.8 6.5 - 8.1 g/dL   Albumin  4.1 3.5 - 5.0 g/dL   AST 23 15 - 41 U/L    Comment: HEMOLYSIS AT THIS LEVEL MAY AFFECT RESULT   ALT 19 0 - 44 U/L   Alkaline Phosphatase 100 38 - 126 U/L   Total Bilirubin 0.4 0.0 - 1.2 mg/dL   Bilirubin, Direct 0.1 0.0 - 0.2 mg/dL    Comment: HEMOLYSIS AT THIS LEVEL MAY AFFECT RESULT   Indirect Bilirubin 0.3 0.3 - 0.9 mg/dL    Comment: Performed at [facility], [address] [place], [place] [address]  Lipase, blood     Status: None   Collection Time: 12/23/23  4:08 AM  Result Value Ref Range   Lipase 27 11 - 51 U/L    Comment: Performed at [facility], [address] [place], [place] [address]  Urinalysis, Routine w reflex microscopic -Urine, Clean Catch     Status: Abnormal   Collection Time: 12/23/23  4:30 AM  Result Value Ref Range   Color, Urine YELLOW YELLOW   APPearance CLEAR CLEAR   Specific Gravity, Urine 1.025  1.005 - 1.030   pH >=9.0 5.0 - 8.0   Glucose, UA NEGATIVE NEGATIVE mg/dL   Hgb urine dipstick NEGATIVE NEGATIVE   Bilirubin Urine NEGATIVE NEGATIVE   Ketones, ur 40 (A) NEGATIVE mg/dL   Protein, ur 899 (A) NEGATIVE mg/dL   Nitrite NEGATIVE NEGATIVE  Leukocytes,Ua NEGATIVE NEGATIVE    Comment: Performed at [facility], [address] [place], [place] [address]  Urinalysis, Microscopic (reflex)     Status: Abnormal   Collection Time: 12/23/23  4:30 AM  Result Value Ref Range   RBC / HPF 0-5 0 - 5 RBC/hpf   WBC, UA 0-5 0 - 5 WBC/hpf   Bacteria, UA RARE (A) NONE SEEN   Squamous Epithelial / HPF 0-5 0 - 5 /HPF   Mucus PRESENT     Comment: Performed at [facility], [address] [place], [place] [address]   CT ABDOMEN PELVIS W CONTRAST Result Date: 12/23/2023 EXAM: CT ABDOMEN AND PELVIS WITH CONTRAST 12/23/2023 04:57:54 AM TECHNIQUE: CT of the abdomen and pelvis was performed with the administration of 100 mL of iohexol  (OMNIPAQUE ) 300 MG/ML solution. Multiplanar reformatted images are provided for review. Automated exposure control, iterative reconstruction, and/or weight-based adjustment of the mA/kV was utilized to reduce the radiation dose to as low as reasonably achievable. COMPARISON: CT abdomen and pelvis with intravenous contrast from 03/06/2020 and 03/14/2020. CLINICAL HISTORY: Right lower quadrant abdominal pain with nausea. History of diabetes, hysterectomy, hernia repair. FINDINGS: LOWER CHEST: No acute abnormality. LIVER: The liver is unremarkable. GALLBLADDER AND BILE DUCTS: Gallbladder is unremarkable. No biliary ductal dilatation. SPLEEN: No acute abnormality. PANCREAS: No acute abnormality. ADRENAL GLANDS: No acute abnormality. KIDNEYS, URETERS AND BLADDER: No stones in the kidneys or ureters. No hydronephrosis. No perinephric or periureteral stranding. Urinary bladder is unremarkable. GI AND BOWEL: There are thickened folds in the antropyloric stomach  consistent with gastritis or peptic ulcer disease. There is a large air-fluid collection with patchy air in the fluid, anterior mid abdomen measuring 14.1 x 8 cm on axial series 301 image 44, 11.2 cm in height on coronal reconstruction series 601 image 112. This does appear consistent with an abscess, but is suspected to be in communication with a thick-walled small bowel segment along the inferior aspect of the collection near the midline and best seen on coronal reconstruction images 92 through 98. Underlying etiology could be infectious or inflammatory enteritis with a perforation resulting in an abscess, or a neoplasm which perforated. Rest of the small bowel is normal caliber above the abnormality, is mildly dilated below the abnormality up to 3.2 cm caliber inferior to the collection. There is mesenteric swirling in the right lower quadrant axial 48-61 compatible with an internal hernia, which could be intermittently obstructing. The appendix is normal in caliber. Large bowel is unremarkable apart from mild constipation. There is no free air or bowel pneumatosis. PERITONEUM AND RETROPERITONEUM: There is a small volume of low-density free fluid in the pelvic cul-de-sac. No free air. VASCULATURE: Aorta is normal in caliber. LYMPH NODES: No lymphadenopathy. REPRODUCTIVE ORGANS: The uterus is absent. No adnexal mass is seen. BONES AND SOFT TISSUES: There is chronic bilateral nonerosive sacroiliitis which may be seen with inflammatory bowel disease. There are mild degenerative changes in the lumbar spine. No acute or other significant osseous findings. No focal soft tissue abnormality. IMPRESSION: 1. Large anterior mid-abdominal abscess measuring up to 14.1 x 8 x 11.2 cm, suspected in communication with a thick-walled small bowel segment underneath it, with possible etiologies including infectious or inflammatory enteritis with perforation versus perforated neoplasm. 2. Mesenteric swirling in the right lower  quadrant compatible with an internal hernia, which could be intermittently obstructing. There is mild dilatation of the pelvic small bowel segments. 3. Thickened folds in the antropyloric stomach  consistent with gastritis or peptic ulcer disease. 4. 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 Electronically signed by: Francis Quam MD 12/23/2023 05:29 AM EDT RP Workstation: [id number]      Assessment/Plan Bowel perforation with abscess - CT with large abscess 14 x 8 x11 cm suspected in communication with thick-walled small bowel segment, unclear etiology; mesenteric swirling in RLQ compatible with internal hernia which could be intermittently obstructing, thickened folds in antropyloric stomach, chronic bilateral nonerosive sacroiliitis - WBC 6.7, afebrile but was initially tachycardic - pt appears ill and not peritonitic on exam but has R sided abdominal tenderness - to OR for exploratory laparotomy and likely bowel resection   Admit to [facility] post-operatively.   FEN: NPO, LR@125  cc/h VTE: SCDs ID: Zosyn  10/31>>, getting vanc now in ED  HTN T2DM - will need SSI post-op   I reviewed ED provider notes, last 24 h vitals and pain scores, last 48 h intake and output, last 24 h labs and trends, and last 24 h imaging results.  This care required high  level of medical decision making.   Burnard JONELLE Louder, [facility] Surgery 12/23/2023, 8:17 AM Please see Amion for pager number during day hours 7:00am-4:30pm

## 2023-12-23 NOTE — ED Triage Notes (Signed)
 Pt arrives with c/o RLQ pain that woke pt up from sleep. Pain is getting worse. C/o nausea. Denies diarrhea , urinary symptoms, vaginal bleeding. No fevers

## 2023-12-23 NOTE — Anesthesia Preprocedure Evaluation (Addendum)
 Anesthesia Evaluation  Patient identified by MRN, date of birth, ID band Patient awake    Reviewed: Allergy & Precautions, NPO status , Patient's Chart, lab work & pertinent test results  Airway Mallampati: II  TM Distance: >3 FB Neck ROM: Full    Dental  (+) Dental Advisory Given, Poor Dentition, Chipped   Pulmonary neg pulmonary ROS   Pulmonary exam normal breath sounds clear to auscultation       Cardiovascular hypertension, Normal cardiovascular exam Rhythm:Regular Rate:Normal     Neuro/Psych negative neurological ROS  negative psych ROS   GI/Hepatic negative GI ROS, Neg liver ROS,,,  Endo/Other  diabetes, Type 2, Oral Hypoglycemic Agents    Renal/GU negative Renal ROS  negative genitourinary   Musculoskeletal negative musculoskeletal ROS (+)    Abdominal   Peds  Hematology negative hematology ROS (+)   Anesthesia Other Findings   Reproductive/Obstetrics                              Anesthesia Physical Anesthesia Plan  ASA: 2  Anesthesia Plan: General   Post-op Pain Management: Ketamine IV*, Dilaudid  IV and Tylenol  PO (pre-op)*   Induction: Intravenous and Rapid sequence  PONV Risk Score and Plan: 3 and Midazolam , Dexamethasone  and Ondansetron   Airway Management Planned: Oral ETT  Additional Equipment:   Intra-op Plan:   Post-operative Plan: Extubation in OR and Possible Post-op intubation/ventilation  Informed Consent: I have reviewed the patients History and Physical, chart, labs and discussed the procedure including the risks, benefits and alternatives for the proposed anesthesia with the patient or authorized representative who has indicated his/her understanding and acceptance.     Dental advisory given  Plan Discussed with: CRNA  Anesthesia Plan Comments: (2 IVs)         Anesthesia Quick Evaluation

## 2023-12-23 NOTE — ED Notes (Signed)
 OR Consent signed, at [facility].

## 2023-12-23 NOTE — ED Notes (Addendum)
 Last PO intake 1600, getting into gown, belongings bagged and labeled. Husband to be coming at some time.

## 2023-12-24 ENCOUNTER — Encounter (HOSPITAL_COMMUNITY): Payer: Self-pay | Admitting: Surgery

## 2023-12-24 DIAGNOSIS — Z79899 Other long term (current) drug therapy: Secondary | ICD-10-CM | POA: Diagnosis not present

## 2023-12-24 DIAGNOSIS — E785 Hyperlipidemia, unspecified: Secondary | ICD-10-CM | POA: Diagnosis present

## 2023-12-24 DIAGNOSIS — Z90710 Acquired absence of both cervix and uterus: Secondary | ICD-10-CM | POA: Diagnosis not present

## 2023-12-24 DIAGNOSIS — Z7984 Long term (current) use of oral hypoglycemic drugs: Secondary | ICD-10-CM | POA: Diagnosis not present

## 2023-12-24 DIAGNOSIS — L0291 Cutaneous abscess, unspecified: Secondary | ICD-10-CM | POA: Diagnosis present

## 2023-12-24 DIAGNOSIS — K469 Unspecified abdominal hernia without obstruction or gangrene: Secondary | ICD-10-CM | POA: Diagnosis present

## 2023-12-24 DIAGNOSIS — E1169 Type 2 diabetes mellitus with other specified complication: Secondary | ICD-10-CM | POA: Diagnosis present

## 2023-12-24 DIAGNOSIS — Q438 Other specified congenital malformations of intestine: Secondary | ICD-10-CM | POA: Diagnosis not present

## 2023-12-24 DIAGNOSIS — K562 Volvulus: Secondary | ICD-10-CM | POA: Diagnosis present

## 2023-12-24 DIAGNOSIS — I10 Essential (primary) hypertension: Secondary | ICD-10-CM | POA: Diagnosis present

## 2023-12-24 LAB — CBC
HCT: 41.2 % (ref 36.0–46.0)
Hemoglobin: 12.9 g/dL (ref 12.0–15.0)
MCH: 27.5 pg (ref 26.0–34.0)
MCHC: 31.3 g/dL (ref 30.0–36.0)
MCV: 87.8 fL (ref 80.0–100.0)
Platelets: 189 K/uL (ref 150–400)
RBC: 4.69 MIL/uL (ref 3.87–5.11)
RDW: 12.8 % (ref 11.5–15.5)
WBC: 12.1 K/uL — ABNORMAL HIGH (ref 4.0–10.5)
nRBC: 0 % (ref 0.0–0.2)

## 2023-12-24 LAB — GLUCOSE, CAPILLARY
Glucose-Capillary: 115 mg/dL — ABNORMAL HIGH (ref 70–99)
Glucose-Capillary: 184 mg/dL — ABNORMAL HIGH (ref 70–99)
Glucose-Capillary: 52 mg/dL — ABNORMAL LOW (ref 70–99)
Glucose-Capillary: 113 mg/dL — ABNORMAL HIGH (ref 70–99)
Glucose-Capillary: 167 mg/dL — ABNORMAL HIGH (ref 70–99)

## 2023-12-24 LAB — BASIC METABOLIC PANEL WITH GFR
Anion gap: 9 (ref 5–15)
BUN: 17 mg/dL (ref 6–20)
CO2: 25 mmol/L (ref 22–32)
Calcium: 8.8 mg/dL — ABNORMAL LOW (ref 8.9–10.3)
Chloride: 103 mmol/L (ref 98–111)
Creatinine, Ser: 0.58 mg/dL (ref 0.44–1.00)
GFR, Estimated: >60 mL/min (ref >60)
Glucose, Bld: 127 mg/dL — ABNORMAL HIGH (ref 70–99)
Potassium: 4.5 mmol/L (ref 3.5–5.1)
Sodium: 137 mmol/L (ref 135–145)

## 2023-12-24 LAB — HIV ANTIBODY (ROUTINE TESTING W REFLEX): HIV Screen 4th Generation wRfx: NONREACTIVE

## 2023-12-24 NOTE — Progress Notes (Signed)
 1 Day Post-Op   Subjective/Chief Complaint: Was very sleepy overnight.  This AM denies n/v, but no flatus yet.  Pain controlled with meds, sore otherwise.     Objective: Vital signs in last 24 hours: Temp:  [97.4 F (36.3 C)-98.7 F (37.1 C)] 98.7 F (37.1 C) (11/01 0759) Pulse Rate:  [50-90] 82 (11/01 0759) Resp:  [11-21] 16 (11/01 0759) BP: (111-138)/(71-94) 121/71 (11/01 0759) SpO2:  [93 %-100 %] 100 % (11/01 0759) Last BM Date : 12/22/23  Intake/Output from previous day: 10/31 0701 - 11/01 0700 In: 4713.2 [P.O.:480; I.V.:2983.2; IV Piggyback:1250] Out: 895 [Urine:875; Blood:20] Intake/Output this shift: No intake/output data recorded.  General appearance: alert, cooperative, and no distress Resp:  breathing comfortably GI: soft, sl bloated, approp tender at incision.  Some dried blood on dressing.     Lab Results:  Recent Labs    12/23/23 0408 12/24/23 0517  WBC 6.7 12.1*  HGB 13.5 12.9  HCT 41.1 41.2  PLT 234 189   BMET Recent Labs    12/23/23 0408 12/24/23 0517  NA 139 137  K 4.1 4.5  CL 103 103  CO2 23 25  GLUCOSE 163* 127*  BUN 27* 17  CREATININE 0.73 0.58  CALCIUM  9.3 8.8*   PT/INR No results for input(s): LABPROT, INR in the last 72 hours. ABG No results for input(s): PHART, HCO3 in the last 72 hours.  Invalid input(s): PCO2, PO2  Studies/Results: CT ABDOMEN PELVIS W CONTRAST Result Date: 12/23/2023 EXAM: CT ABDOMEN AND PELVIS WITH CONTRAST 12/23/2023 04:57:54 AM TECHNIQUE: CT of the abdomen and pelvis was performed with the administration of 100 mL of iohexol  (OMNIPAQUE ) 300 MG/ML solution. Multiplanar reformatted images are provided for review. Automated exposure control, iterative reconstruction, and/or weight-based adjustment of the mA/kV was utilized to reduce the radiation dose to as low as reasonably achievable. COMPARISON: CT abdomen and pelvis with intravenous contrast from 03/06/2020 and 03/14/2020. CLINICAL HISTORY:  Right lower quadrant abdominal pain with nausea. History of diabetes, hysterectomy, hernia repair. FINDINGS: LOWER CHEST: No acute abnormality. LIVER: The liver is unremarkable. GALLBLADDER AND BILE DUCTS: Gallbladder is unremarkable. No biliary ductal dilatation. SPLEEN: No acute abnormality. PANCREAS: No acute abnormality. ADRENAL GLANDS: No acute abnormality. KIDNEYS, URETERS AND BLADDER: No stones in the kidneys or ureters. No hydronephrosis. No perinephric or periureteral stranding. Urinary bladder is unremarkable. GI AND BOWEL: There are thickened folds in the antropyloric stomach consistent with gastritis or peptic ulcer disease. There is a large air-fluid collection with patchy air in the fluid, anterior mid abdomen measuring 14.1 x 8 cm on axial series 301 image 44, 11.2 cm in height on coronal reconstruction series 601 image 112. This does appear consistent with an abscess, but is suspected to be in communication with a thick-walled small bowel segment along the inferior aspect of the collection near the midline and best seen on coronal reconstruction images 92 through 98. Underlying etiology could be infectious or inflammatory enteritis with a perforation resulting in an abscess, or a neoplasm which perforated. Rest of the small bowel is normal caliber above the abnormality, is mildly dilated below the abnormality up to 3.2 cm caliber inferior to the collection. There is mesenteric swirling in the right lower quadrant axial 48-61 compatible with an internal hernia, which could be intermittently obstructing. The appendix is normal in caliber. Large bowel is unremarkable apart from mild constipation. There is no free air or bowel pneumatosis. PERITONEUM AND RETROPERITONEUM: There is a small volume of low-density free fluid in the  pelvic cul-de-sac. No free air. VASCULATURE: Aorta is normal in caliber. LYMPH NODES: No lymphadenopathy. REPRODUCTIVE ORGANS: The uterus is absent. No adnexal mass is seen. BONES  AND SOFT TISSUES: There is chronic bilateral nonerosive sacroiliitis which may be seen with inflammatory bowel disease. There are mild degenerative changes in the lumbar spine. No acute or other significant osseous findings. No focal soft tissue abnormality. IMPRESSION: 1. Large anterior mid-abdominal abscess measuring up to 14.1 x 8 x 11.2 cm, suspected in communication with a thick-walled small bowel segment underneath it, with possible etiologies including infectious or inflammatory enteritis with perforation versus perforated neoplasm. 2. Mesenteric swirling in the right lower quadrant compatible with an internal hernia, which could be intermittently obstructing. There is mild dilatation of the pelvic small bowel segments. 3. Thickened folds in the antropyloric stomach consistent with gastritis or peptic ulcer disease. 4. 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 Electronically signed by: Francis Quam MD 12/23/2023 05:29 AM EDT RP Workstation: [id number]    Anti-infectives: Anti-infectives (From admission, onward)    Start     Dose/Rate Route Frequency Ordered Stop   12/23/23 1400  piperacillin -tazobactam (ZOSYN ) IVPB 3.375 g  Status:  Discontinued        3.375 g 12.5 mL/hr over 240 Minutes Intravenous Every 8 hours 12/23/23 0818 12/23/23 1439   12/23/23 0830  piperacillin -tazobactam (ZOSYN ) IVPB 3.375 g  Status:  Discontinued        3.375 g 100 mL/hr over 30 Minutes Intravenous Every 8 hours 12/23/23 0816 12/23/23 0818   12/23/23 0545  vancomycin  (VANCOCIN ) IVPB 1000 mg/200 mL premix        1,000 mg 200 mL/hr over 60 Minutes Intravenous  Once 12/23/23 0533 12/23/23 0841   12/23/23 0545  piperacillin -tazobactam (ZOSYN ) IVPB 3.375 g        3.375 g 100 mL/hr over 30 Minutes Intravenous  Once 12/23/23 0533 12/23/23 0723       Assessment/Plan: s/p Procedure(s) with comments: LAPAROTOMY, EXPLORATORY (N/A) - POSSIBLE SMALL BOWEL  RESECTION Mobilize Incentive spirometry Advance diet, ARBF Lovenox  for vte ppx.  Hold on meal coverage for DM, only on home metformin.  Just do SSI.    LOS: 0 days    Jina LITTIE Nephew, MD, FACS, FSSO Surgical Oncology, General Surgery, Trauma and Critical [facility] Surgery, [place] [phone] for weekday/non holidays Check [email or web address] for coverage night/weekend/holidays under General Surgery

## 2023-12-25 LAB — GLUCOSE, CAPILLARY
Glucose-Capillary: 105 mg/dL — ABNORMAL HIGH (ref 70–99)
Glucose-Capillary: 110 mg/dL — ABNORMAL HIGH (ref 70–99)
Glucose-Capillary: 114 mg/dL — ABNORMAL HIGH (ref 70–99)
Glucose-Capillary: 117 mg/dL — ABNORMAL HIGH (ref 70–99)

## 2023-12-25 NOTE — Progress Notes (Signed)
 2 Days Post-Op   Subjective/Chief Complaint: No pain with toradol , tylenol  and robaxin standing doses.  No n/v.  Passing gas.  No BM yet.    Objective: Vital signs in last 24 hours: Temp:  [98.4 F (36.9 C)-98.6 F (37 C)] 98.6 F (37 C) (11/02 0625) Pulse Rate:  [76-92] 82 (11/02 0625) Resp:  [15-16] 16 (11/02 0625) BP: (116-122)/(72-94) 122/94 (11/02 0625) SpO2:  [95 %-99 %] 99 % (11/02 0625) Last BM Date : 12/22/23  Intake/Output from previous day: 11/01 0701 - 11/02 0700 In: 400 [P.O.:400] Out: 2250 [Urine:2250] Intake/Output this shift: No intake/output data recorded.  General appearance: alert, cooperative, and no distress. Sitting up in chair.   Resp:  breathing comfortably GI: soft, non distended.   approp tender at incision.  Some dried blood on dressing.     Lab Results:  Recent Labs    12/23/23 0408 12/24/23 0517  WBC 6.7 12.1*  HGB 13.5 12.9  HCT 41.1 41.2  PLT 234 189   BMET Recent Labs    12/23/23 0408 12/24/23 0517  NA 139 137  K 4.1 4.5  CL 103 103  CO2 23 25  GLUCOSE 163* 127*  BUN 27* 17  CREATININE 0.73 0.58  CALCIUM  9.3 8.8*   PT/INR No results for input(s): LABPROT, INR in the last 72 hours. ABG No results for input(s): PHART, HCO3 in the last 72 hours.  Invalid input(s): PCO2, PO2  Studies/Results: No results found.   Anti-infectives: Anti-infectives (From admission, onward)    Start     Dose/Rate Route Frequency Ordered Stop   12/23/23 1400  piperacillin -tazobactam (ZOSYN ) IVPB 3.375 g  Status:  Discontinued        3.375 g 12.5 mL/hr over 240 Minutes Intravenous Every 8 hours 12/23/23 0818 12/23/23 1439   12/23/23 0830  piperacillin -tazobactam (ZOSYN ) IVPB 3.375 g  Status:  Discontinued        3.375 g 100 mL/hr over 30 Minutes Intravenous Every 8 hours 12/23/23 0816 12/23/23 0818   12/23/23 0545  vancomycin  (VANCOCIN ) IVPB 1000 mg/200 mL premix        1,000 mg 200 mL/hr over 60 Minutes Intravenous  Once  12/23/23 0533 12/23/23 0841   12/23/23 0545  piperacillin -tazobactam (ZOSYN ) IVPB 3.375 g        3.375 g 100 mL/hr over 30 Minutes Intravenous  Once 12/23/23 0533 12/23/23 0723       Assessment/Plan: s/p Procedure(s) with comments: LAPAROTOMY, EXPLORATORY (N/A) - POSSIBLE SMALL BOWEL RESECTION Mobilize Incentive spirometry Soft diet today.  Lovenox  for vte ppx.  Hold on meal coverage for DM, only on home metformin.  Just do SSI.   Dispo: possibly home tomorrow if continues to do well.     LOS: 1 day    Jina LITTIE Nephew, MD, FACS, FSSO Surgical Oncology, General Surgery, Trauma and Critical [facility] Surgery, [place] [phone] for weekday/non holidays Check [email or web address] for coverage night/weekend/holidays under General Surgery

## 2023-12-25 NOTE — Plan of Care (Signed)

## 2023-12-26 LAB — GLUCOSE, CAPILLARY
Glucose-Capillary: 101 mg/dL — ABNORMAL HIGH (ref 70–99)
Glucose-Capillary: 123 mg/dL — ABNORMAL HIGH (ref 70–99)

## 2023-12-26 MED ORDER — POLYETHYLENE GLYCOL 3350 17 G PO PACK
17.0000 g | PACK | Freq: Two times a day (BID) | ORAL | Status: DC
  Administered 2023-12-26: 17 g via ORAL
  Filled 2023-12-26: qty 1

## 2023-12-26 MED ORDER — BISACODYL 10 MG RE SUPP
10.0000 mg | Freq: Once | RECTAL | Status: AC
  Administered 2023-12-26: 10 mg via RECTAL
  Filled 2023-12-26: qty 1

## 2023-12-26 NOTE — Progress Notes (Signed)
 [facility] Progress Note  3 Days Post-Op  Subjective: CC:  Pain overall controlled, passing gas, tolerating PO, no reported urinary sxs. Mobilizing No BM yet  At baseline she lives at home with her husband and two children. 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   Objective: Vital signs in last 24 hours: Temp:  [97.5 F (36.4 C)-98.6 F (37 C)] 97.8 F (36.6 C) (11/03 0730) Pulse Rate:  [63-86] 65 (11/03 0730) Resp:  [16-17] 16 (11/03 0730) BP: (109-135)/(78-92) 118/78 (11/03 0730) SpO2:  [96 %-100 %] 98 % (11/03 0730) Last BM Date : 12/22/23  Intake/Output from previous day: 11/02 0701 - 11/03 0700 In: 1080 [P.O.:1080] Out: -  Intake/Output this shift: No intake/output data recorded.  PE: Gen:  Alert, NAD, pleasant Card:  Regular rate and rhythm Pulm:  Normal effort ORA Abd: Soft, appropriately tender, non-distended, incisions C/D/I - honeycomb dressing with scant SS strikethrough. Skin: warm and dry, no rashes  Psych: A&Ox3   Lab Results:  Recent Labs    12/24/23 0517  WBC 12.1*  HGB 12.9  HCT 41.2  PLT 189   BMET Recent Labs    12/24/23 0517  NA 137  K 4.5  CL 103  CO2 25  GLUCOSE 127*  BUN 17  CREATININE 0.58  CALCIUM  8.8*   PT/INR No results for input(s): LABPROT, INR in the last 72 hours. CMP     Component Value Date/Time   NA 137 12/24/2023 0517   K 4.5 12/24/2023 0517   CL 103 12/24/2023 0517   CO2 25 12/24/2023 0517   GLUCOSE 127 (H) 12/24/2023 0517   BUN 17 12/24/2023 0517   CREATININE 0.58 12/24/2023 0517   CALCIUM  8.8 (L) 12/24/2023 0517   PROT 6.8 12/23/2023 0408   ALBUMIN  4.1 12/23/2023 0408   AST 23 12/23/2023 0408   ALT 19 12/23/2023 0408   ALKPHOS 100 12/23/2023 0408   BILITOT 0.4 12/23/2023 0408   GFRNONAA >60 12/24/2023 0517   GFRAA >60 09/26/2014 0850   Lipase     Component Value Date/Time   LIPASE 27 12/23/2023 0408       Studies/Results: No results  found.  Anti-infectives: Anti-infectives (From admission, onward)    Start     Dose/Rate Route Frequency Ordered Stop   12/23/23 1400  piperacillin -tazobactam (ZOSYN ) IVPB 3.375 g  Status:  Discontinued        3.375 g 12.5 mL/hr over 240 Minutes Intravenous Every 8 hours 12/23/23 0818 12/23/23 1439   12/23/23 0830  piperacillin -tazobactam (ZOSYN ) IVPB 3.375 g  Status:  Discontinued        3.375 g 100 mL/hr over 30 Minutes Intravenous Every 8 hours 12/23/23 0816 12/23/23 0818   12/23/23 0545  vancomycin  (VANCOCIN ) IVPB 1000 mg/200 mL premix        1,000 mg 200 mL/hr over 60 Minutes Intravenous  Once 12/23/23 0533 12/23/23 0841   12/23/23 0545  piperacillin -tazobactam (ZOSYN ) IVPB 3.375 g        3.375 g 100 mL/hr over 30 Minutes Intravenous  Once 12/23/23 0533 12/23/23 9276        Assessment/Plan  POD#3 s/p exploratory laparotomy, reduction of cecal volvulus, cecopexy 10/31 Dr. Paola Continue HH diet  Add dulcolax and miralax Likely home this afternoon if has a BM Follow up POD#14 for staple removal and then in 3-4 weeks with Dr. Paola     LOS: 2 days   I reviewed nursing notes, last 24 h vitals and pain  scores, last 48 h intake and output, last 24 h labs and trends, and last 24 h imaging results.  This care required straight-forward level of medical decision making.   Katherine Pringle, Katherine Cabrera [facility] Please see Amion for pager number during day hours 7:00am-4:30pm

## 2023-12-26 NOTE — Plan of Care (Signed)

## 2023-12-26 NOTE — Discharge Summary (Signed)
 [facility] Discharge Summary   Patient ID: Katherine Cabrera MRN: [id number] DOB/AGE: 25-Aug-1975 48 y.o.  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  Procedures Dr. Paola 12/23/23 - exploratory laparotomy, reduction of cecal volvulus, cecopexy   Hospital Course:  48 y/o F who presented with abdominal pain.  workup significant for radiographic findings suggestive of small bowel perforation and internal hernia   she was taken for laparotomy where cecal volvulus was found. underwent procedure listed above.  Tolerated procedure well and was transferred to the floor.  Diet was advanced as tolerated.  On POD#3, the patient was voiding well, tolerating diet, ambulating well, pain well controlled, vital signs stable, incisions c/d/i and felt stable for discharge home.  Patient will follow up in our office as below and knows to call with questions or concerns.    I have personally reviewed the patients medication history on the [place] controlled substance database.    Physical Exam: General:  Alert, NAD, pleasant, comfortable Abd:  Soft, ND, mild tenderness, incisions C/D/I  Allergies as of 12/26/2023       Reactions   Other Hives   ? Anti-fungal oral med        Medication List     TAKE these medications    acetaminophen  500 MG tablet Commonly known as: TYLENOL  Take 2 tablets (1,000 mg total) by mouth 4 (four) times daily. What changed:   medication strength how much to take when to take this reasons to take this   atorvastatin  40 MG tablet Commonly known as: LIPITOR Take 40 mg by mouth at bedtime.   Biotin 1000 MCG tablet Take 1,000 mcg by mouth daily.   cetirizine  10 MG tablet Commonly known as: ZyrTEC  Allergy Take 1 tablet (10 mg total) by mouth daily as needed for allergies (itch).   ciclopirox 8 % solution Commonly known as: PENLAC Apply 1 Application topically at bedtime.   EPINEPHrine  0.3 mg/0.3 mL Soaj injection Commonly known as: EPI-PEN Inject 0.3 mg into the muscle as needed for anaphylaxis.   famotidine  20 MG tablet Commonly known as: PEPCID  Take 1 tablet (20 mg total) by mouth daily.   FreeStyle Libre 14 Day Sensor Misc Apply 1 each topically every 14 (fourteen) days.   ibuprofen  600 MG tablet Commonly known as: ADVIL  Take 1 tablet (600 mg total) by mouth 4 (four) times daily.   lisinopril 2.5 MG tablet Commonly known as: ZESTRIL Take 2.5 mg by mouth daily.   metFORMIN 500 MG tablet Commonly known as: GLUCOPHAGE Take 500 mg by mouth 2 (two) times daily with a meal.   methocarbamol 750 MG tablet Commonly known as: ROBAXIN Take 1 tablet (750 mg total) by mouth 4 (four) times daily.   Multi-Vitamin tablet Take 1 tablet by mouth daily.   oxyCODONE  5 MG immediate release tablet Commonly known as: Roxicodone  Take 1 tablet (5 mg total) by mouth every 4 (four) hours as needed.   polyethylene glycol 17 g packet Commonly known  as: MiraLax Take 17 g by mouth daily.          Follow-up Information     Paola Dreama SAILOR, MD Follow up on 01/05/2024.   Specialty: Surgery Why: 10:30am, Arrive 30 minutes prior to your appointment time, Please bring your insurance card and photo ID Contact information: [address] [place] [address] [facility] [place] [address] [phone]                 Signed: Almarie Pringle, [facility]  Surgery 12/26/2023, 12:07 PM

## 2023-12-26 NOTE — Plan of Care (Signed)

## 2023-12-26 NOTE — Progress Notes (Signed)
   12/26/23 0933  TOC Brief Assessment  Insurance and Status Reviewed  Patient has primary care physician Yes  Home environment has been reviewed home with spouse  Prior level of function: independent  Social Drivers of Health Review SDOH reviewed no interventions necessary  Readmission risk has been reviewed Yes  Transition of care needs no transition of care needs at this time

## 2023-12-28 LAB — CULTURE, BLOOD (ROUTINE X 2)
Culture: NO GROWTH
Culture: NO GROWTH
Special Requests: ADEQUATE

## 2024-01-02 NOTE — Progress Notes (Addendum)
 Aleene Andes, MD [facility] Internal Medicine - [facility] [address] [place], [place] [address] [phone] office   Date: 01/02/2024 Patient Name: Katherine Cabrera Chief Complaint:  Chief Complaint  Patient presents with  . hospital follow up    HISTORY OF PRESENT ILLNESS: 48 y.o. female with PMH as below who presents to [facility] clinic for follow up after recent hospital discharge. Patient was admitted for small bowel perforation and internal hernia s/p exploratory laparotomy, reduction of cecal volvulus, and cecopexy. Hospital course uneventful. She denies any complaints today. Wound healing well, having regular bowel movements.   Patient reports medication compliance, states that they are in their usual state, and denies any new complaints at this time.   Past Medical History:  Medical History[1] Past Surgical History:  Surgical History[2] Family History:  Family History[3] Social History: Social History   Socioeconomic History  . Marital status: Married    Spouse name: Not on file  . Number of children: Not on file  . Years of education: Not on file  . Highest education level: Not on file  Occupational History  . Not on file  Tobacco Use  . Smoking status: Never    Passive exposure: Never  . Smokeless tobacco: Never  Vaping Use  . Vaping status: Never Used  Substance and Sexual Activity  . Alcohol use: No  . Drug use: No  . Sexual activity: Yes    Partners: Male    Birth control/protection: Surgical    Comment: Hysterectomy  Other Topics Concern  . Not on file  Social History Narrative  . Not on file   Social Drivers of Health   Food Insecurity: No Food Insecurity (12/23/2023)   Received from [facility]   Food vital sign   . Within the past 12 months, you worried that your food would run out before you got money to buy more: Never true   . Within the past 12 months, the food you bought just didn't last and you  didn't have money to get more: Never true  Transportation Needs: No Transportation Needs (12/23/2023)   Received from [facility] - Transportation   . In the past 12 months, has lack of transportation kept you from medical appointments or from getting medications?: No   . In the past 12 months, has lack of transportation kept you from meetings, work, or from getting things needed for daily living?: No  Safety: Not At Risk (12/23/2023)   Received from [facility]   Safety   . Within the last year, have you been afraid of your partner or ex-partner?: No   . Within the last year, have you been humiliated or emotionally abused in other ways by your partner or ex-partner?: No   . Within the last year, have you been kicked, hit, slapped, or otherwise physically hurt by your partner or ex-partner?: No   . Within the last year, have you been raped or forced to have any kind of sexual activity by your partner or ex-partner?: No  Living Situation: Low Risk  (12/23/2023)   Received from [facility] Situation   . In the last 12 months, was there a time when you were not able to pay the mortgage or rent on time?: No   . In the past 12 months, how many times have you moved where you were living?: 0   . At any time in the past  12 months, were you homeless or living in a shelter (including now)?: No    Review of Systems  Constitutional:  Negative for fever and unexpected weight change.  Eyes:  Negative for visual disturbance.  Respiratory:  Negative for cough, shortness of breath and wheezing.   Cardiovascular:  Negative for chest pain, palpitations and leg swelling.  Gastrointestinal:  Negative for blood in stool and constipation.       Feeling sore around recent surgical wound  Endocrine: Negative for polydipsia, polyphagia and polyuria.  Genitourinary:  Negative for dysuria, frequency and hematuria.  Musculoskeletal:  Negative for arthralgias.  Neurological:  Negative for  dizziness and headaches.     Current Medications:  Current Medications[4]  Allergies:  Terbinafine  PHYSICAL EXAM BP 123/79 (BP Location: Right arm, Patient Position: Sitting)   Pulse 97   Temp 97.5 F (36.4 C) (Temporal)   Resp 16   Ht 1.607 m (5' 3.25)   Wt 83.5 kg (184 lb)   SpO2 95%   BMI 32.34 kg/m   Physical Exam Constitutional:      General: She is not in acute distress.    Appearance: She is not ill-appearing.  HENT:     Head: Normocephalic and atraumatic.     Right Ear: Tympanic membrane normal.     Left Ear: Tympanic membrane normal.     Mouth/Throat:     Mouth: Mucous membranes are moist.  Eyes:     Conjunctiva/sclera: Conjunctivae normal.     Pupils: Pupils are equal, round, and reactive to light.  Cardiovascular:     Rate and Rhythm: Normal rate.     Pulses: Normal pulses.     Heart sounds: No murmur heard.    No gallop.  Pulmonary:     Effort: No respiratory distress.     Breath sounds: No wheezing or rales.  Abdominal:     General: There is no distension.     Palpations: Abdomen is soft. There is no mass.     Tenderness: There is no abdominal tenderness.     Comments: Mid-line surgical wound with staples and dressing in place  Musculoskeletal:     Cervical back: Normal range of motion and neck supple.     Right lower leg: No edema.     Left lower leg: No edema.  Neurological:     General: No focal deficit present.     Mental Status: She is oriented to person, place, and time.  Psychiatric:        Mood and Affect: Mood normal.       TEST RESULTS:  Results for orders placed or performed in visit on 07/13/23  TSH   Collection Time: 07/13/23 10:06 AM  Result Value Ref Range   TSH 1.187 0.450 - 5.330 uIU/mL  Lipid Panel   Collection Time: 07/13/23 10:06 AM  Result Value Ref Range   Cholesterol, Total, Lipid Panel 115 <200 mg/dL   Triglycerides, Lipid Panel 40 <150 mg/dL   HDL Cholesterol - Lipid Panel 60 >=60 mg/dL   LDL  Cholesterol, Calculated 44 <100 mg/dL   Non-HDL Cholesterol 55 mg/dL  Hemoglobin J8R With Estimated Average Glucose   Collection Time: 07/13/23 10:06 AM  Result Value Ref Range   Hemoglobin A1c 6.2 (H) <5.7 %   Estimated Average Glucose 131 mg/dL  Comprehensive Metabolic Panel   Collection Time: 07/13/23 10:06 AM  Result Value Ref Range   Sodium 138 136 - 145 mmol/L   Potassium 4.4 3.5 - 5.1  mmol/L   Chloride 104 98 - 107 mmol/L   CO2 24 21 - 31 mmol/L   Anion Gap 10 6 - 14 mmol/L   Glucose, Random 102 (H) 70 - 99 mg/dL   Blood Urea Nitrogen (BUN) 24 7 - 25 mg/dL   Creatinine 9.30 9.39 - 1.20 mg/dL   eGFR >09 >40 fO/fpw/8.26f7   Albumin  4.2 3.5 - 5.7 g/dL   Total Protein 6.7 6.4 - 8.9 g/dL   Bilirubin, Total 0.7 0.3 - 1.0 mg/dL   Alkaline Phosphatase (ALP) 54 34 - 104 U/L   Aspartate Aminotransferase (AST) 17 13 - 39 U/L   Alanine Aminotransferase (ALT) 14 7 - 52 U/L   Calcium  8.9 8.6 - 10.3 mg/dL   BUN/Creatinine Ratio    Vitamin D, 25-Hydroxy   Collection Time: 07/13/23 10:06 AM  Result Value Ref Range   Vitamin D 25-Hydroxy 52.9 30.0 - 100.0 ng/mL  CBC with Differential   Collection Time: 07/13/23 10:06 AM  Result Value Ref Range   WBC 3.80 (L) 4.40 - 11.00 10*3/uL   RBC 4.74 4.10 - 5.10 10*6/uL   Hemoglobin 13.3 12.3 - 15.3 g/dL   Hematocrit 60.1 64.0 - 44.6 %   Mean Corpuscular Volume (MCV) 84.0 80.0 - 96.0 fL   Mean Corpuscular Hemoglobin (MCH) 28.1 27.5 - 33.2 pg   Mean Corpuscular Hemoglobin Conc (MCHC) 33.5 33.0 - 37.0 g/dL   Red Cell Distribution Width (RDW) 13.2 12.3 - 17.0 %   Platelet Count (PLT) 216 150 - 450 10*3/uL   Mean Platelet Volume (MPV) 7.5 6.8 - 10.2 fL   Neutrophils % 51 %   Lymphocytes % 32 %   Monocytes % 7 %   Eosinophils % 9 %   Basophils % 1 %   nRBC % 0 %   Neutrophils Absolute 1.90 1.80 - 7.80 10*3/uL   Lymphocytes # 1.20 1.00 - 4.80 10*3/uL   Monocytes # 0.30 0.00 - 0.80 10*3/uL   Eosinophils # 0.30 0.00 - 0.50 10*3/uL   Basophils  # 0.00 0.00 - 0.20 10*3/uL   nRBC Absolute 0.00 <=0.00 10*3/uL     ASSESSMENT AND PLAN:  Assessment & Plan Need for influenza vaccination  Orders: .  Flu,Trivalent,IM, Preservative Free (FLULAVAL, (FLUZONE(SCOT)))  History of small bowel obstruction - s/p exploratory laparotomy, reduction of cecal volvulus, cecopexy (12/23/23) - asymptomatic at this time, wound healing well, having bowel movements  - following with general surgery - physical activity encouraged - advised on high fiber diet, increased fruits, vegetables and more fluid intake    Type 2 diabetes mellitus with both eyes affected by mild nonproliferative retinopathy without macular edema, without long-term current use of insulin  (HCC) - last A1c 6.2, at goal - c/w metformin 500 mg daily  - c/w lisinopril 2.5 mg daily for nephro-protection  - c.w atorvastatin  40 mg daily for ASCVD risk benefit  - upcoming appointment with podaitry for diabeitc foot exam and with ophthalmology for diabetic eye exam - BMP, urine microalbumin and A1c today  Orders: .  Basic Metabolic Panel; Future .  Hemoglobin A1C With Estimated Average Glucose; Future .  Albumin , Random Urine; Future .  CBC with Differential; Future  Mixed hyperlipidemia - last LDL 44, at goal - c/w atorvastatin  40 mg daily  - low-fat diet, exercise and behavioral modification reiterated     Class 1 obesity due to excess calories with serious comorbidity and body mass index (BMI) of 32.0 to 32.9 in adult - BMI 32.34 - diet,  exercise and behavioral modification reiterated     Encounter for screening for human immunodeficiency virus (HIV)  Orders: .  HIV Screen with Reflex to Confirmation; Future  Type 2 diabetes mellitus with mild nonproliferative retinopathy without macular edema, without long-term current use of insulin , unspecified laterality  Orders: .  metFORMIN (GLUCOPHAGE-XR) 500 mg 24 hr tablet; Take 1 tablet (500 mg total) by mouth daily with  breakfast.      Behavioral Health Screening  Patient Health Questionnaire-2 Score: 0 (01/02/2024  2:19 PM)      Patient's Depression screening is Negative     Immunization History  Administered Date(s) Administered  . Influenza, Injectable, Quadrivalent, Preservative Free 01/31/2019, 03/22/2020, 11/13/2020, 12/24/2021  . Influenza, Unspecified 04/29/2016, 11/22/2016, 02/22/2018  . Influenza, split virus, trivalent, preservative 12/04/2012, 03/22/2020  . Influenza,split virus, trivalent, PF 01/02/2024  . Janssen Sars-CoV-2 Vaccination 05/30/2019  . Pneumococcal Polysaccharide Vaccine, 23 Valent (PNEUMOVAX-23) 2Y+ 09/08/2016  . TDAP VACCINE (BOOSTRIX,ADACEL) 7Y+ 12/21/2007, 02/22/2010, 04/28/2022   Health Maintenance Due  Topic Date Due  . HIV Screening  Never done  . Hepatitis B Vaccines (1 of 3 - 19+ 3-dose series) Never done  . Pneumococcal Vaccine: Pediatrics (0 to 5 years) and At-Risk Patients (6-49 Years) (2 of 2 - PCV) 09/08/2017  . Diabetes: Retinopathy Screening Combo  07/29/2022  . Comprehensive Annual Visit  09/01/2023  . COVID-19 Vaccine (2 - 2025-26 season) 10/24/2023  . Diabetes:  Quantitative uACR for Kidney Evaluation  03/08/2024   Health Maintenance  Topic Date Due  . HIV Screening  Never done  . Hepatitis B Vaccines (1 of 3 - 19+ 3-dose series) Never done  . Pneumococcal Vaccine: Pediatrics (0 to 5 years) and At-Risk Patients (6-49 Years) (2 of 2 - PCV) 09/08/2017  . Diabetes: Retinopathy Screening Combo  07/29/2022  . Comprehensive Annual Visit  09/01/2023  . COVID-19 Vaccine (2 - 2025-26 season) 10/24/2023  . Diabetes:  Quantitative uACR for Kidney Evaluation  03/08/2024  . Diabetes:  eGFR for Kidney Evaluation  07/12/2024  . Diabetes: Foot Exam  07/12/2024  . Diabetes: Hemoglobin A1C  07/12/2024  . Depression Screening  07/12/2024  . Breast Cancer Screening (Mammogram)  10/05/2024  . Colorectal Cancer Screening  04/21/2032  . DTaP/Tdap/Td Vaccines  (4 - Td or Tdap) 04/27/2032  . Adult RSV (50+ Years or Pregnancy) (1 - 1-dose 75+ series) 06/28/2050  . Influenza Vaccine  Completed  . Hepatitis C Screening  Completed  . HIB Vaccines  Aged Out  . IPV Vaccines  Aged Out  . Hepatitis A Vaccines  Aged Out  . Meningococcal Conjugate (ACWY) Vaccine  Aged Out  . Rotavirus Vaccines  Aged Out  . HPV Vaccines  Aged Out  . Meningococcal B Vaccine  Aged Out  . Cervical Cancer Screening  Discontinued  . Diabetes Screening  Discontinued     RTC 4 months with labs    Aleene Andes, MD 01/02/24       [1] Past Medical History: Diagnosis Date  . Abnormal Pap smear of cervix   . Depression   . Diabetes    (CMD)   . Eczema   . Gestational diabetes (CMD)   . Menorrhagia   . Obesity   . Retinopathy due to secondary diabetes mellitus, without macular edema, with mild nonproliferative retinopathy    (CMD)   [2] Past Surgical History: Procedure Laterality Date  . ABDOMINAL WASHOUT     Procedure: ABDOMINAL WASHOUT  . CERVICAL BIOPSY  W/ LOOP ELECTRODE EXCISION  Procedure: CERVICAL BIOPSY  W/ LOOP ELECTRODE EXCISION  . CESAREAN SECTION, UNSPECIFIED     Procedure: CESAREAN SECTION  . COLPOSCOPY     Procedure: COLPOSCOPY  . DIAGNOSTIC LAPAROSCOPY     Procedure: LAPAROSCOPY  . DILATION AND CURETTAGE OF UTERUS     Procedure: DILATION AND CURETTAGE OF UTERUS  . ENDOMETRIAL ABLATION     Procedure: ENDOMETRIAL ABLATION  . HYSTEROSCOPY     Procedure: HYSTEROSCOPY  . OTHER SURGICAL HISTORY  09/2016   Procedure: OTHER SURGICAL HISTORY (uterine ablation); Dr. Kathy  . OTHER SURGICAL HISTORY     Procedure: OTHER SURGICAL HISTORY (rigid sigmoidoscopy)  . ROBOTIC ASSISTED HYSTERECTOMY Bilateral 12/25/2019   Procedure: HYSTERECTOMY LAPAROSCOPIC ROBOTIC, bilateral salpingectomy  Robotic umbilical hernia repair;  Surgeon: Vicenta Francis Kathy, MD;  Location: [facility] MAIN OR;  Service: Gynecology;  Laterality: Bilateral;  COVID vaccinated, combo with  Dr Cara  . TUBAL LIGATION     Procedure: TUBAL LIGATION  . UMBILICAL HERNIA REPAIR N/A 12/25/2019   Procedure: UMBILICAL HERNIA REPAIR ROBOTIC WITH MESH;  Surgeon: Bethann Cara, MD;  Location: [facility] MAIN OR;  Service: General;  Laterality: N/A;  [3] Family History Problem Relation Name Age of Onset  . Hypertension Mother    . Ulcers Mother    . Diabetes Maternal Grandmother    . Dementia Maternal Grandfather    . Diabetes Paternal Grandfather    . Arthritis Father    . Breast cancer Maternal Aunt    [4]  Current Outpatient Medications:  .  acetaminophen  (TYLENOL ) 500 mg tablet, Take 1,000 mg by mouth 4 (four) times a day., Disp: , Rfl:  .  atorvastatin  (LIPITOR) 40 mg tablet, Take 1 tablet by mouth nightly, Disp: 90 tablet, Rfl: 1 .  biotin (MERIBIN) 1 mg tab tablet, Take 1,000 mcg by mouth Once Daily., Disp: , Rfl:  .  EPINEPHrine  (EPIPEN ) 0.3 mg/0.3 mL injection syringe, Inject 0.3 mg into the thigh., Disp: , Rfl:  .  flash glucose scanning reader (FreeStyle Libre 14 Day Reader) misc, Scan as needed., Disp: 1 each, Rfl: 0 .  flash glucose sensor (FreeStyle Libre 14 Day Sensor) kit, USE AS DIRECTED EVERY 14 DAYS, Disp: 2 each, Rfl: 1 .  ibuprofen  (MOTRIN ) 600 mg tablet, Take 600 mg by mouth every 6 (six) hours as needed., Disp: , Rfl:  .  lisinopriL (PRINIVIL) 2.5 mg tablet, Take 1 tablet by mouth once daily, Disp: 90 tablet, Rfl: 1 .  metFORMIN (GLUCOPHAGE-XR) 500 mg 24 hr tablet, TAKE 2 TABLETS BY MOUTH IN THE MORNING AND 2 IN THE EVENING WITH MEALS, Disp: 360 tablet, Rfl: 1 .  methocarbamoL (ROBAXIN) 750 mg tablet, Take 750 mg by mouth 3 (three) times a day., Disp: , Rfl:  .  multivitamin (THERAGRAN) tab tablet, Take 1 tablet by mouth Once Daily., Disp: , Rfl:  .  polyethylene glycol (GLYCOLAX) 17 gram packet, Take 17 g by mouth daily., Disp: , Rfl:

## 2024-01-02 NOTE — Progress Notes (Signed)
 Pt is here for hospital follow up. She states that she is feeling well but still sore from the surgery. Pt has agreed  to get the flu vaccine today.

## 2024-01-03 NOTE — Progress Notes (Signed)
 [facility] post TCM follow up  Date of call: 01-03-24  Discharged from: Hospital  Updates/Changes since last encounter: Pt reported she is getting better. She denied c/o nausea or vomiting or fever. She stated she feels sore. She stated her incision site is healing. She denied any signs of redness or swelling or drainage. She has an appt with [facility] on 01-05-24. She stated she is eating / drinking well and keeping hydrated. She stated she is having bowel movements. No needs identified at this time.   Current Questions/Concerns: none at this time   Electronically signed by: Mliss Nicholaus Sharps, RN 01/03/2024 11:57 AM

## 2024-01-05 NOTE — Progress Notes (Signed)
   PROVIDER:  DREAMA LOISE HANGER, MD  MRN: [id number] DOB: [birth year] DATE OF ENCOUNTER: 01/05/2024 Interval History:   The patient is s/p exlap cecopexy on 10/31. Post-operatively, the patient is eating well, voiding, ambulatory, and pain is controlled. There are no concerns at this visit. She reports constipation much improved since surgery and was having daily BMs with miralax, but stopped miralax 2-3 days ago and has not had a BM the last 2d.   Physical Examination:   Physical Exam   Gen: comfortable, no distress Neuro: non-focal exam HEENT: PERRL Neck: supple CV: RRR Pulm: unlabored breathing on RA Abd: soft, NT, incision intact-surgical honeycomb dressing in place, some central maceration of the skin Extr: wwp, no edema, normal gait    Assessment and Plan:   Katherine Cabrera is a 48 y.o. female who underwent exlap, cecopexy on 10/31.  There are no diagnoses linked to this encounter.    Expected post-op course. No concerns at this visit. Keep wound clean and dry. Okay to shower. Continue lifting restrictions until 6w post-op. Resume miralax as chronic constipation is likely from floppy colon. F/u PRN.    No follow-ups on file.   The plan was discussed in detail with the patient today, who expressed understanding.  The patient has my contact information, and understands to call me with any additional questions or concerns in the interval.  I would be happy to see the patient back sooner if the need arises.   I spent a total of 30 minutes in both face-to-face and non-face-to-face activities, excluding procedures performed, for this visit on the date of this encounter.      DREAMA LOISE HANGER, MD

## 2024-01-06 ENCOUNTER — Encounter (HOSPITAL_COMMUNITY): Payer: Self-pay

## 2024-01-06 ENCOUNTER — Other Ambulatory Visit: Payer: Self-pay

## 2024-01-06 ENCOUNTER — Emergency Department (HOSPITAL_COMMUNITY)
Admission: EM | Admit: 2024-01-06 | Discharge: 2024-01-06 | Disposition: A | Discharge status: Home / Self Care | Attending: Emergency Medicine | Admitting: Emergency Medicine

## 2024-01-06 ENCOUNTER — Emergency Department (HOSPITAL_COMMUNITY)

## 2024-01-06 DIAGNOSIS — Z5189 Encounter for other specified aftercare: Secondary | ICD-10-CM

## 2024-01-06 DIAGNOSIS — Z79899 Other long term (current) drug therapy: Secondary | ICD-10-CM | POA: Insufficient documentation

## 2024-01-06 DIAGNOSIS — Z7984 Long term (current) use of oral hypoglycemic drugs: Secondary | ICD-10-CM | POA: Diagnosis not present

## 2024-01-06 DIAGNOSIS — Z4801 Encounter for change or removal of surgical wound dressing: Secondary | ICD-10-CM | POA: Insufficient documentation

## 2024-01-06 LAB — URINALYSIS, ROUTINE W REFLEX MICROSCOPIC
Bilirubin Urine: NEGATIVE
Glucose, UA: NEGATIVE mg/dL
Hgb urine dipstick: NEGATIVE
Ketones, ur: NEGATIVE mg/dL
Leukocytes,Ua: NEGATIVE
Nitrite: NEGATIVE
Protein, ur: NEGATIVE mg/dL
Specific Gravity, Urine: 1.043 — ABNORMAL HIGH (ref 1.005–1.030)
pH: 7 (ref 5.0–8.0)

## 2024-01-06 LAB — CBC WITH DIFFERENTIAL/PLATELET
Abs Immature Granulocytes: 0 K/uL (ref 0.00–0.07)
Basophils Absolute: 0 K/uL (ref 0.0–0.1)
Basophils Relative: 1 %
Eosinophils Absolute: 0.6 K/uL — ABNORMAL HIGH (ref 0.0–0.5)
Eosinophils Relative: 12 %
HCT: 36.7 % (ref 36.0–46.0)
Hemoglobin: 12.2 g/dL (ref 12.0–15.0)
Immature Granulocytes: 0 %
Lymphocytes Relative: 28 %
Lymphs Abs: 1.6 K/uL (ref 0.7–4.0)
MCH: 28.3 pg (ref 26.0–34.0)
MCHC: 33.2 g/dL (ref 30.0–36.0)
MCV: 85.2 fL (ref 80.0–100.0)
Monocytes Absolute: 0.4 K/uL (ref 0.1–1.0)
Monocytes Relative: 7 %
Neutro Abs: 2.9 K/uL (ref 1.7–7.7)
Neutrophils Relative %: 52 %
Platelets: 324 K/uL (ref 150–400)
RBC: 4.31 MIL/uL (ref 3.87–5.11)
RDW: 13.2 % (ref 11.5–15.5)
WBC: 5.6 K/uL (ref 4.0–10.5)
nRBC: 0 % (ref 0.0–0.2)

## 2024-01-06 LAB — I-STAT CHEM 8, ED
BUN: 30 mg/dL — ABNORMAL HIGH (ref 6–20)
Calcium, Ion: 1.24 mmol/L (ref 1.15–1.40)
Chloride: 104 mmol/L (ref 98–111)
Creatinine, Ser: 0.8 mg/dL (ref 0.44–1.00)
Glucose, Bld: 90 mg/dL (ref 70–99)
HCT: 36 % (ref 36.0–46.0)
Hemoglobin: 12.2 g/dL (ref 12.0–15.0)
Potassium: 4.9 mmol/L (ref 3.5–5.1)
Sodium: 139 mmol/L (ref 135–145)
TCO2: 30 mmol/L (ref 22–32)

## 2024-01-06 MED ORDER — IOHEXOL 300 MG/ML  SOLN
100.0000 mL | Freq: Once | INTRAMUSCULAR | Status: AC | PRN
  Administered 2024-01-06: 100 mL via INTRAVENOUS

## 2024-01-06 NOTE — Discharge Instructions (Addendum)
 You were seen here in the ER today for evaluation of your wound check.  You can apply dry dressing to the wound daily.  Please make sure the wound staying clean with Dial soap and water.  You follow-up with [facility] surgery as needed.  If you have any concerns of any worsening symptoms, please return urinary department for evaluation.  Contact a doctor if: Your wound does not seem to be healing right. You have a fever. You have signs of infection. Get help right away if: Your wound breaks open more. You have red streaks coming from your wound. You have a lot of bleeding coming from your wound.

## 2024-01-06 NOTE — ED Triage Notes (Signed)
 Pt BIB ems, pt had a abdominal surgery on Oct 31 and had staples placed at the incision site. The staples were removed yesterday, and this morning she experience slight bleeding and pressure from the site, also states her belly button looks weird with some discharge. Had a bowel movement this morning. Denies N/V/D. VS stable, A&Ox4

## 2024-01-06 NOTE — ED Provider Notes (Signed)
 [place] [facility]GENC[facility] DE[facility]MENT AT [facility] Provider Note   CSN: [id number] Arrival date & time: 01/06/24  1315     Patient presents with: Post-op Problem   Katherine Cabrera is a 48 y.o. 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  Patient was seen by [facility] surgery yesterday and had staples removed.  She reports that she had eaten breakfast this morning and went to change to her close and noticed that her tank top was covered in blood.  She reports that there was some bloody drainage present near her umbilicus as well as from the top of the wound.  No fevers.  She reports that her abdominal pressure has been unchanged since surgery.  No nausea or vomiting.  Reports that she had a bowel movement yesterday that was his consistency of peanut butter.  Still moving her bowels with gas as well.  No fevers.  Denies any trauma.  HPI     Prior to Admission medications   Medication Sig Start Date End Date Taking? Authorizing Provider  acetaminophen  (TYLENOL ) 500 MG tablet Take 2 tablets (1,000 mg total) by mouth 4 (four) times daily. 12/23/23   Paola Dreama SAILOR, MD  atorvastatin  (LIPITOR) 40 MG tablet Take 40 mg by mouth at bedtime. 02/12/20   [provider]  Biotin 1000 MCG tablet Take 1,000 mcg by mouth daily. 12/20/19   [provider]  cetirizine  (ZYRTEC  ALLERGY) 10 MG tablet Take 1 tablet (10 mg total) by mouth daily as needed for allergies (itch). Patient not taking: Reported on 12/24/2023 09/25/22   Silver Fell A, PA  ciclopirox (PENLAC) 8 % solution Apply 1 Application topically at bedtime.    [provider]  Continuous Blood Gluc Sensor (FREESTYLE LIBRE 14 DAY SENSOR) MISC Apply 1 each topically every 14 (fourteen) days. 02/09/20   [provider]  EPINEPHrine  0.3 mg/0.3 mL IJ SOAJ injection Inject 0.3 mg into the muscle as  needed for anaphylaxis. Patient not taking: Reported on 12/24/2023 09/25/22   Silver Fell A, PA  famotidine  (PEPCID ) 20 MG tablet Take 1 tablet (20 mg total) by mouth daily. Patient not taking: Reported on 12/24/2023 03/23/20   Regalado, Owen A, MD  ibuprofen  (ADVIL ) 600 MG tablet Take 1 tablet (600 mg total) by mouth 4 (four) times daily. 12/23/23   Paola Dreama SAILOR, MD  lisinopril (ZESTRIL) 2.5 MG tablet Take 2.5 mg by mouth daily.    [provider]  metFORMIN (GLUCOPHAGE) 500 MG tablet Take 500 mg by mouth 2 (two) times daily with a meal.    [provider]  methocarbamol (ROBAXIN) 750 MG tablet Take 1 tablet (750 mg total) by mouth 4 (four) times daily. 12/23/23   Paola Dreama SAILOR, MD  Multiple Vitamin (MULTI-VITAMIN) tablet Take 1 tablet by mouth daily.    [provider]  oxyCODONE  (ROXICODONE ) 5 MG immediate release tablet Take 1 tablet (5 mg total) by mouth every 4 (four) hours as needed. 12/23/23 12/22/24  Paola Dreama SAILOR, MD  polyethylene glycol (MIRALAX) 17 g packet Take 17 g by mouth daily. 12/23/23   Paola Dreama SAILOR, MD    Allergies: Other    Review of Systems  Constitutional:  Negative for chills and fever.  Gastrointestinal:  Negative for nausea and vomiting.  Skin:  Positive for wound.    Updated Vital Signs BP 120/82 (BP Location: Right Arm)  Pulse 92   Temp 98.2 F (36.8 C) (Oral)   Resp 16   Ht 5' 3 (1.6 m)   Wt 83.5 kg   LMP 05/07/2019   SpO2 98%   BMI 32.59 kg/m   Physical Exam Vitals and nursing note reviewed.  Constitutional:      General: She is not in acute distress.    Appearance: She is not ill-appearing or toxic-appearing.  Eyes:     General: No scleral icterus. Cardiovascular:     Rate and Rhythm: Normal rate.  Pulmonary:     Effort: Pulmonary effort is normal. No respiratory distress.  Abdominal:     Palpations: Abdomen is soft.     Tenderness: There is no abdominal tenderness. There is no guarding or  rebound.      Comments: Mild dehiscence near the umbilicus.  She also some dried blood at the very superior portion.  Otherwise wound is intact.  No fluctuance or induration.  Was unable to express any fluid upon palpation of the area.  Minimal tenderness around the surgical incision however otherwise abdomen nontender.  Soft.  Skin:    General: Skin is warm and dry.  Neurological:     Mental Status: She is alert.     (all labs ordered are listed, but only abnormal results are displayed) Labs Reviewed  CBC WITH DIFFERENTIAL/PLATELET - Abnormal; Notable for the following components:      Result Value   Eosinophils Absolute 0.6 (*)    All other components within normal limits  I-STAT CHEM 8, ED - Abnormal; Notable for the following components:   BUN 30 (*)    All other components within normal limits  URINALYSIS, ROUTINE W REFLEX MICROSCOPIC  COMPREHENSIVE METABOLIC PANEL WITH GFR    EKG: None  Radiology: CT ABDOMEN PELVIS W CONTRAST Result Date: 01/06/2024 EXAM: CT ABDOMEN AND PELVIS WITH CONTRAST 01/06/2024 04:22:06 PM TECHNIQUE: CT of the abdomen and pelvis was performed with the administration of 100 mL of iohexol  (OMNIPAQUE ) 300 MG/ML solution. Multiplanar reformatted images are provided for review. Automated exposure control, iterative reconstruction, and/or weight-based adjustment of the mA/kV was utilized to reduce the radiation dose to as low as reasonably achievable. COMPARISON: 14 days ago. CLINICAL HISTORY: Abdominal pain, post-op; bleeding and pain from surgical site, recent ex-lap. FINDINGS: LOWER CHEST: No acute abnormality. LIVER: The liver is unremarkable. GALLBLADDER AND BILE DUCTS: Gallbladder is unremarkable. No biliary ductal dilatation. SPLEEN: No acute abnormality. PANCREAS: No acute abnormality. ADRENAL GLANDS: No acute abnormality. KIDNEYS, URETERS AND BLADDER: No stones in the kidneys or ureters. No hydronephrosis. No perinephric or periureteral stranding. Urinary  bladder is unremarkable. GI AND BOWEL: Stomach demonstrates no acute abnormality. There is no bowel obstruction. PERITONEUM AND RETROPERITONEUM: No ascites. No free air. VASCULATURE: Aorta is normal in caliber. LYMPH NODES: No lymphadenopathy. REPRODUCTIVE ORGANS: Status post hysterectomy. BONES AND SOFT TISSUES: Postsurgical changes are seen involving the anterior abdominal wall. No acute osseous abnormality. IMPRESSION: 1. No acute findings in the abdomen or pelvis. Electronically signed by: Lynwood Seip MD 01/06/2024 04:44 PM EST RP Workstation: [id number]   Procedures   Medications Ordered in the ED  iohexol  (OMNIPAQUE ) 300 MG/ML solution 100 mL (100 mLs Intravenous Contrast Given 01/06/24 1611)    Clinical Course as of 01/06/24 1810  Fri Jan 06, 2024  1512 Burnard KIDD saw at bedside, does not feel any intervention needs to be done. Dry dressing and follow up in clinic as needed.   [RR]    Clinical Course  User Index [RR] Bernis Ernst, PA-C   Medical Decision Making Amount and/or Complexity of Data Reviewed Labs: ordered. Radiology: ordered.  Risk Prescription drug management.   48 y.o. female presents to the ER for evaluation of discharge from surgical wound. Differential diagnosis includes but is not limited to seroma, hematoma, infection, wound dehiscence. Vital signs unremarkable. Physical exam as noted above.  No active discharge present.  Does have some crusted blood to the more superior aspect of the wound.  Some mild dehiscence noted near the umbilicus however otherwise wound appears intact.  There is no surrounding induration or fluctuance.  Abdomen is soft. Minimal tenderness near the surgical incision however otherwise nontender.  Surgical progress note from yesterday recommends follow-up with surgery center as needed.  Will consult general surgery to evaluate the patient at bedside.  Spoke to Burnard Banter, PA-C with general surgery who will evaluate patient at bedside.  Please  see her consultation note.  I independently reviewed and interpreted the patient's labs.  CBC without leukocytosis or anemia.  I-STAT Chem-8 shows no electrolyte abnormality other than BUN being 30.  CT shows No acute findings in the abdomen or pelvis. Per radiologist's interpretation.    General surgery came to see the patient at bedside.  Recommendation is dry dressings.  They do not dissipate CT imaging being abnormal, can follow-up with surgery as needed outpatient.  Patient's not appear in acute distress.  CT without acute finding.  Labs unremarkable for any significant change.  Vital signs are stable.  Dry dressing applied by nursing staff.  Discussed wound care.  Discussed follow-up with general surgery as needed.  She is stable for discharge home.  We discussed the results of the labs/imaging. The plan is wound care, follow-up. We discussed strict return precautions and red flag symptoms. The patient verbalized their understanding and agrees to the plan. The patient is stable and being discharged home in good condition.  Portions of this report may have been transcribed using voice recognition software. Every effort was made to ensure accuracy; however, inadvertent computerized transcription errors may be present.    Final diagnoses:  Visit for wound check    ED Discharge Orders     None          Bernis Ernst, DEVONNA 01/06/24 2003    Towana Ozell BROCKS, MD 01/07/24 1455

## 2024-01-06 NOTE — Progress Notes (Signed)
       Subjective: Patient presents today to the ED after having follow up with Dr. 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  She has been doing well, tolerating a diet, moving her bowels, etc.  She had her staples removed yesterday and this morning she had significant thin bloody drainage from her wound.  She called EMS who brought her here to the ED.  Her drainage has stopped at this time.  Her labs are all stable and hgb is normal at 12.2.  She has a CT ordered, but we have been asked to look at her wound.   Objective: Vital signs in last 24 hours: Temp:  [99.2 F (37.3 C)] 99.2 F (37.3 C) (11/14 1343) Pulse Rate:  [92] 92 (11/14 1343) Resp:  [14] 14 (11/14 1343) BP: (129)/(83) 129/83 (11/14 1343) SpO2:  [99 %-100 %] 99 % (11/14 1343) Weight:  [83.5 kg] 83.5 kg (11/14 1333)    Intake/Output from previous day: No intake/output data recorded. Intake/Output this shift: No intake/output data recorded.  PE: Abd: soft, midline wound is healing well.  There is a small opening at her umbilicus, but there is no further drainage.  She has no erythema, purulent drainage, induration, etc.  Her abdomen is otherwise NT, ND  Lab Results:  Recent Labs    01/06/24 1500 01/06/24 1512  WBC 5.6  --   HGB 12.2 12.2  HCT 36.7 36.0  PLT 324  --    BMET Recent Labs    01/06/24 1512  NA 139  K 4.9  CL 104  GLUCOSE 90  BUN 30*  CREATININE 0.80   PT/INR No results for input(s): LABPROT, INR in the last 72 hours. CMP     Component Value Date/Time   NA 139 01/06/2024 1512   K 4.9 01/06/2024 1512   CL 104 01/06/2024 1512   CO2 25 12/24/2023 0517   GLUCOSE 90 01/06/2024 1512   BUN 30 (H) 01/06/2024 1512   CREATININE 0.80 01/06/2024 1512   CALCIUM  8.8 (L) 12/24/2023 0517   PROT 6.8 12/23/2023 0408   ALBUMIN  4.1 12/23/2023 0408   AST 23 12/23/2023 0408   ALT 19 12/23/2023 0408   ALKPHOS 100  12/23/2023 0408   BILITOT 0.4 12/23/2023 0408   GFRNONAA >60 12/24/2023 0517   GFRAA >60 09/26/2014 0850   Lipase     Component Value Date/Time   LIPASE 27 12/23/2023 0408       Studies/Results: No results found.  Anti-infectives: Anti-infectives (From admission, onward)    None        Assessment/Plan S/p ex lap with cecopexy 10/31 by Dr. Paola with likely spontaneous drainage of a seroma -suspect the patient spontaneously drained a seroma from her wound this morning with serosanguineous drainage -no bleeding currently or any further drainage -no evidence of infection or other post op complications -labs look stable and normal -CT is pending, but suspect this will look well based on her exam -patient is surgically stable for DC home pending other findings on her scan. -d/w patient and EDPA -she may follow up with Dr. Paola PRN     LOS: 0 days    Burnard FORBES Banter , [facility] Surgery 01/06/2024, 3:41 PM Please see Amion for pager number during day hours 7:00am-4:30pm or 7:00am -11:30am on weekends

## 2024-01-06 NOTE — ED Notes (Signed)
 Pt to CT
